# Patient Record
Sex: Female | Born: 1970 | Race: Black or African American | Hispanic: No | Smoking: Never smoker
Health system: Southern US, Community
[De-identification: ages and names within clinical notes are randomized; demographics above are authoritative.]

## PROBLEM LIST (undated history)

## (undated) DIAGNOSIS — E860 Dehydration: Secondary | ICD-10-CM

## (undated) DIAGNOSIS — K219 Gastro-esophageal reflux disease without esophagitis: Secondary | ICD-10-CM

## (undated) DIAGNOSIS — R55 Syncope and collapse: Secondary | ICD-10-CM

## (undated) DIAGNOSIS — L509 Urticaria, unspecified: Secondary | ICD-10-CM

## (undated) DIAGNOSIS — L309 Dermatitis, unspecified: Secondary | ICD-10-CM

## (undated) DIAGNOSIS — J45909 Unspecified asthma, uncomplicated: Secondary | ICD-10-CM

## (undated) DIAGNOSIS — E119 Type 2 diabetes mellitus without complications: Secondary | ICD-10-CM

## (undated) HISTORY — DX: Dermatitis, unspecified: L30.9

## (undated) HISTORY — PX: NO PAST SURGERIES: SHX2092

## (undated) HISTORY — DX: Urticaria, unspecified: L50.9

---

## 1998-06-04 ENCOUNTER — Other Ambulatory Visit: Admission: RE | Admit: 1998-06-04 | Discharge: 1998-06-04 | Payer: Self-pay | Admitting: Obstetrics and Gynecology

## 1998-08-13 ENCOUNTER — Other Ambulatory Visit: Admission: RE | Admit: 1998-08-13 | Discharge: 1998-08-13 | Payer: Self-pay | Admitting: Obstetrics and Gynecology

## 1998-11-05 ENCOUNTER — Other Ambulatory Visit: Admission: RE | Admit: 1998-11-05 | Discharge: 1998-11-05 | Payer: Self-pay | Admitting: Obstetrics and Gynecology

## 1999-01-28 ENCOUNTER — Other Ambulatory Visit: Admission: RE | Admit: 1999-01-28 | Discharge: 1999-01-28 | Payer: Self-pay | Admitting: Obstetrics and Gynecology

## 1999-02-15 ENCOUNTER — Emergency Department (HOSPITAL_COMMUNITY): Admission: EM | Admit: 1999-02-15 | Discharge: 1999-02-15 | Payer: Self-pay | Admitting: Emergency Medicine

## 1999-05-07 ENCOUNTER — Other Ambulatory Visit: Admission: RE | Admit: 1999-05-07 | Discharge: 1999-05-07 | Payer: Self-pay | Admitting: Obstetrics and Gynecology

## 1999-09-03 ENCOUNTER — Other Ambulatory Visit: Admission: RE | Admit: 1999-09-03 | Discharge: 1999-09-03 | Payer: Self-pay | Admitting: Obstetrics and Gynecology

## 2000-05-02 ENCOUNTER — Emergency Department (HOSPITAL_COMMUNITY): Admission: EM | Admit: 2000-05-02 | Discharge: 2000-05-02 | Payer: Self-pay

## 2000-06-09 ENCOUNTER — Other Ambulatory Visit: Admission: RE | Admit: 2000-06-09 | Discharge: 2000-06-09 | Payer: Self-pay | Admitting: Obstetrics and Gynecology

## 2001-08-10 ENCOUNTER — Other Ambulatory Visit: Admission: RE | Admit: 2001-08-10 | Discharge: 2001-08-10 | Payer: Self-pay | Admitting: Obstetrics and Gynecology

## 2002-01-07 ENCOUNTER — Emergency Department (HOSPITAL_COMMUNITY): Admission: EM | Admit: 2002-01-07 | Discharge: 2002-01-07 | Payer: Self-pay | Admitting: Emergency Medicine

## 2002-07-20 ENCOUNTER — Emergency Department (HOSPITAL_COMMUNITY): Admission: EM | Admit: 2002-07-20 | Discharge: 2002-07-20 | Payer: Self-pay

## 2002-07-20 ENCOUNTER — Encounter: Payer: Self-pay | Admitting: Emergency Medicine

## 2002-08-15 ENCOUNTER — Other Ambulatory Visit: Admission: RE | Admit: 2002-08-15 | Discharge: 2002-08-15 | Payer: Self-pay | Admitting: Obstetrics and Gynecology

## 2003-03-09 ENCOUNTER — Inpatient Hospital Stay (HOSPITAL_COMMUNITY): Admission: AD | Admit: 2003-03-09 | Discharge: 2003-03-09 | Payer: Self-pay | Admitting: Obstetrics and Gynecology

## 2003-05-13 ENCOUNTER — Encounter: Payer: Self-pay | Admitting: Obstetrics and Gynecology

## 2003-05-13 ENCOUNTER — Inpatient Hospital Stay (HOSPITAL_COMMUNITY): Admission: AD | Admit: 2003-05-13 | Discharge: 2003-05-13 | Payer: Self-pay | Admitting: Obstetrics and Gynecology

## 2003-08-22 ENCOUNTER — Inpatient Hospital Stay (HOSPITAL_COMMUNITY): Admission: AD | Admit: 2003-08-22 | Discharge: 2003-08-22 | Payer: Self-pay | Admitting: Obstetrics and Gynecology

## 2003-10-05 ENCOUNTER — Inpatient Hospital Stay (HOSPITAL_COMMUNITY): Admission: AD | Admit: 2003-10-05 | Discharge: 2003-10-05 | Payer: Self-pay | Admitting: Obstetrics and Gynecology

## 2003-10-15 ENCOUNTER — Inpatient Hospital Stay (HOSPITAL_COMMUNITY): Admission: AD | Admit: 2003-10-15 | Discharge: 2003-10-15 | Payer: Self-pay | Admitting: Obstetrics and Gynecology

## 2003-10-19 ENCOUNTER — Ambulatory Visit (HOSPITAL_COMMUNITY): Admission: RE | Admit: 2003-10-19 | Discharge: 2003-10-19 | Payer: Self-pay | Admitting: Obstetrics and Gynecology

## 2003-11-06 ENCOUNTER — Inpatient Hospital Stay (HOSPITAL_COMMUNITY): Admission: AD | Admit: 2003-11-06 | Discharge: 2003-11-06 | Payer: Self-pay | Admitting: Obstetrics and Gynecology

## 2003-11-15 ENCOUNTER — Inpatient Hospital Stay (HOSPITAL_COMMUNITY): Admission: AD | Admit: 2003-11-15 | Discharge: 2003-11-18 | Payer: Self-pay | Admitting: Obstetrics and Gynecology

## 2003-11-15 ENCOUNTER — Encounter (INDEPENDENT_AMBULATORY_CARE_PROVIDER_SITE_OTHER): Payer: Self-pay | Admitting: Specialist

## 2003-11-25 ENCOUNTER — Inpatient Hospital Stay (HOSPITAL_COMMUNITY): Admission: AD | Admit: 2003-11-25 | Discharge: 2003-11-25 | Payer: Self-pay | Admitting: Obstetrics and Gynecology

## 2004-03-04 ENCOUNTER — Other Ambulatory Visit: Admission: RE | Admit: 2004-03-04 | Discharge: 2004-03-04 | Payer: Self-pay | Admitting: Obstetrics and Gynecology

## 2004-11-04 ENCOUNTER — Emergency Department (HOSPITAL_COMMUNITY): Admission: EM | Admit: 2004-11-04 | Discharge: 2004-11-04 | Payer: Self-pay | Admitting: Emergency Medicine

## 2005-03-06 ENCOUNTER — Encounter: Admission: RE | Admit: 2005-03-06 | Discharge: 2005-03-06 | Payer: Self-pay | Admitting: Family Medicine

## 2008-03-21 ENCOUNTER — Emergency Department (HOSPITAL_BASED_OUTPATIENT_CLINIC_OR_DEPARTMENT_OTHER): Admission: EM | Admit: 2008-03-21 | Discharge: 2008-03-21 | Payer: Self-pay | Admitting: Emergency Medicine

## 2010-12-20 NOTE — H&P (Signed)
NAMEJAMMY, STLOUIS                           ACCOUNT NO.:  [id number]   MEDICAL RECORD NO.:  [id number]                   PATIENT TYPE:  INP   LOCATION:  9166                                 FACILITY:  [facility]   PHYSICIAN:  Rica Koyanagi, C.N.M.         DATE OF BIRTH:  12/05/1970   DATE OF ADMISSION:  11/15/2003  DATE OF DISCHARGE:                                HISTORY & PHYSICAL   Ms. Dwyer is a 40 year old, gravida 2, para 1-0-0-1, at 40-6/7 weeks with an  EDD of November 09, 2003, who presents from the office of [facility] following NST  with variable decelerations. Since admission to [facility] the fetal heart rate has  been in the 140s with positive variability. A spontaneous CST was obtained  which is positive. She is contracting every three to four minutes with  repetitive variables late in their onset following each contraction down to  the 120s for 40 to 60 seconds and recovering to the baseline initially on  evaluation. One variable at the beginning of the strip was down to the 100s  times one minute following a contraction. At the present time the fetal  heart rate is in the 140's with positive variability and accelerations.  Fetal heart rate has improved with only occasional mild variable  decelerations since starting oxygen 10 liters  face mask and IV fluid bolus.  The patient's contractions also began spacing out since time and are now  every five to six minutes. The patient reports positive fetal movement. No  bleeding. No range of motion. She does complain of a migraine headache times  ten days.  PIH labs were done and evaluated on November 08, 2003. They were  negative with 24-hour urine protein at 50 mg. The patient is to be admitted  for induction of labor secondary to nonreassuring fetal heart. The patient's  pregnancy was followed by MD service at [facility] and is remarkable for: (1)  first trimester bleeding; (2) RH negative; (3) history of abnormal Pap smear  and cryo in the year  2000; (4) history of migraines; (5) group B strep  positive; (6) obesity. This patient was evaluated at the office [facility] on  April 03, 2003, at approximately eight weeks gestation. EDD determined by  first trimester ultrasound and confirmed with follow-up. The patient's  prenatal course has been complicated with her migraine headaches for which  she has taken Tylenol #3. Otherwise, she has been size greater than dates  secondary to obesity. Ultrasound evaluation at 29 weeks found estimated  fetal weight at 50th to 75th percentile and at 38 weeks ultrasound showed  estimated fetal weight at the 25th to 50th percentile with normal fluid. The  patient's blood pressure was up somewhat on November 06, 2003, at 138/90. At  that time she was evaluated with [facility] labs which were all negative and a 24-  hour urine which was negative. Her blood pressure has come down  to the 120s  to 130s over 80s since that time.   OBSTETRIC HISTORY:  In 1994, the patient had a normal spontaneous vaginal  delivery with a birth of a 7 pound 3 ounce female infant named Aja delivered  by Dr. Beckey Downing with no complications and the present pregnancy.   PRENATAL LABORATORY WORK:  The patient's blood type is A negative. Pap smear  is within normal limits. GC and chlamydia negative. At 36 weeks culture of  the vaginal tract is positive for group B strep. The patient had a normal  one-hour Glucola at 28 weeks and did receive RhoGAM at that time.   PAST MEDICAL HISTORY:  The patient has a history of abnormal Pap and cryo  surgery. Pap smear have been within normal limits since that time. That was  the year 2000. The patient has a history of migraine headaches and a history  of UTIs. She had her wisdom teeth removed in the year 2000.   FAMILY HISTORY:  The patient's father and paternal grandfather with history  of MI; patient's father with history of heart disease; paternal grandmother  and mother with history of hypertension;  patient's mother with aneurysm;  patient's uncle has a history of blood disorders, she is unaware of the  type. Mother with a history of diabetes and stroke. Maternal grandfather  with Alzheimer disease.   GENETIC HISTORY:  The patient's first cousin born with one half of an arm.  The patient's brother and first cousin with history of scoliosis and  patient's nephew with sickle cell trait.   SOCIAL HISTORY:  Ms. Zellner is a 40 year old African American married female.  Her husband, Arrionna Serena, is involved and supportive. They do not  subscribe to a religious faith. The patient has no known drug allergies and  denies the use of illicit drugs. Denies the use of tobacco and admits to two  mixed drinks prior to positive UPT.   REVIEW OF SYSTEMS:  There are no signs or symptoms suggestive of focal or  systemic disease. The patient is typical one with intrauterine pregnancy, [redacted]  weeks gestation with a positive CST, admitted for induction of labor.   PHYSICAL EXAMINATION:  VITAL SIGNS: Stable and afebrile.  HEENT: Unremarkable.  HEART: Regular rate and rhythm.  LUNGS: Clear.  ABDOMEN: Gravid in its contour. The uterus fundus is noted to extend 43 cm  above the level of the pubic symphysis. Leopold's maneuvers finds the infant  to be in the longitudinal lie cephalic presentation and the estimated fetal  weight is 7 to 7.5 pounds. The baseline of the fetal heart rate monitor is  140s, average long-term variability is present. There are some accelerations  and some mild variable decelerations at the present time. The patient is  contracting every five to six minutes. Digital exam of the cervix in the  office of [facility] found it to be 1 cm dilated.  EXTREMITIES: No pathologic edema. DTRs are 1+ with no clonus.   ASSESSMENT:  Intrauterine pregnancy at 40-6/7 weeks; variable decelerations;  positive CST.   PLAN:  The patient is to be admitted per Dr. Dierdre Forth with orders as   written.                                               Rica Koyanagi, C.N.M.    SDM/MEDQ  D:  11/15/2003  T:  11/15/2003  Job:  [id number]

## 2012-10-20 ENCOUNTER — Other Ambulatory Visit: Payer: Self-pay

## 2012-10-20 DIAGNOSIS — Z1231 Encounter for screening mammogram for malignant neoplasm of breast: Secondary | ICD-10-CM

## 2012-10-26 ENCOUNTER — Ambulatory Visit
Admission: RE | Admit: 2012-10-26 | Discharge: 2012-10-26 | Disposition: A | Discharge status: Home / Self Care | Payer: Federal, State, Local not specified - PPO | Source: Ambulatory Visit

## 2012-10-26 DIAGNOSIS — Z1231 Encounter for screening mammogram for malignant neoplasm of breast: Secondary | ICD-10-CM

## 2012-10-29 ENCOUNTER — Other Ambulatory Visit: Payer: Self-pay | Admitting: Obstetrics and Gynecology

## 2012-11-01 ENCOUNTER — Emergency Department (HOSPITAL_BASED_OUTPATIENT_CLINIC_OR_DEPARTMENT_OTHER): Payer: Federal, State, Local not specified - PPO

## 2012-11-01 ENCOUNTER — Encounter (HOSPITAL_BASED_OUTPATIENT_CLINIC_OR_DEPARTMENT_OTHER): Payer: Self-pay | Admitting: *Deleted

## 2012-11-01 ENCOUNTER — Emergency Department (HOSPITAL_BASED_OUTPATIENT_CLINIC_OR_DEPARTMENT_OTHER)
Admission: EM | Admit: 2012-11-01 | Discharge: 2012-11-02 | Disposition: A | Discharge status: Home / Self Care | Payer: Federal, State, Local not specified - PPO | Attending: Emergency Medicine | Admitting: Emergency Medicine

## 2012-11-01 DIAGNOSIS — Z3202 Encounter for pregnancy test, result negative: Secondary | ICD-10-CM | POA: Insufficient documentation

## 2012-11-01 DIAGNOSIS — R112 Nausea with vomiting, unspecified: Secondary | ICD-10-CM | POA: Insufficient documentation

## 2012-11-01 DIAGNOSIS — R109 Unspecified abdominal pain: Secondary | ICD-10-CM | POA: Insufficient documentation

## 2012-11-01 DIAGNOSIS — R197 Diarrhea, unspecified: Secondary | ICD-10-CM | POA: Insufficient documentation

## 2012-11-01 DIAGNOSIS — K219 Gastro-esophageal reflux disease without esophagitis: Secondary | ICD-10-CM | POA: Insufficient documentation

## 2012-11-01 DIAGNOSIS — R55 Syncope and collapse: Secondary | ICD-10-CM

## 2012-11-01 DIAGNOSIS — Z79899 Other long term (current) drug therapy: Secondary | ICD-10-CM | POA: Insufficient documentation

## 2012-11-01 HISTORY — DX: Gastro-esophageal reflux disease without esophagitis: K21.9

## 2012-11-01 LAB — CBC WITH DIFFERENTIAL/PLATELET
Basophils Absolute: 0 10*3/uL (ref 0.0–0.1)
Basophils Relative: 0 % (ref 0–1)
Eosinophils Relative: 0 % (ref 0–5)
Lymphocytes Relative: 7 % — ABNORMAL LOW (ref 12–46)
MCHC: 34.7 g/dL (ref 30.0–36.0)
MCV: 85.8 fL (ref 78.0–100.0)
Neutro Abs: 10.2 10*3/uL — ABNORMAL HIGH (ref 1.7–7.7)
Platelets: 282 10*3/uL (ref 150–400)
RDW: 13.1 % (ref 11.5–15.5)
WBC: 11.7 10*3/uL — ABNORMAL HIGH (ref 4.0–10.5)

## 2012-11-01 LAB — PAP IG W/ RFLX HPV ASCU

## 2012-11-01 MED ORDER — SODIUM CHLORIDE 0.9 % IV BOLUS (SEPSIS)
1000.0000 mL | Freq: Once | INTRAVENOUS | Status: AC
  Administered 2012-11-01: 1000 mL via INTRAVENOUS

## 2012-11-01 MED ORDER — ONDANSETRON HCL 4 MG/2ML IJ SOLN
4.0000 mg | Freq: Once | INTRAMUSCULAR | Status: AC
  Administered 2012-11-01: 4 mg via INTRAVENOUS
  Filled 2012-11-01: qty 2

## 2012-11-01 MED ORDER — HYDROMORPHONE HCL PF 1 MG/ML IJ SOLN
1.0000 mg | Freq: Once | INTRAMUSCULAR | Status: AC
  Administered 2012-11-01: 1 mg via INTRAVENOUS
  Filled 2012-11-01: qty 1

## 2012-11-01 NOTE — ED Provider Notes (Signed)
History    This chart was scribed for No att. providers found by Marlyne Beards, ED Scribe. The patient was seen in room MH03/MH03. Patient's care was started at 11:21 PM.    CSN: [id number]  Arrival date & time 11/01/12  2221   None     Chief Complaint  Patient presents with  . Emesis    (Consider location/radiation/quality/duration/timing/severity/associated sxs/prior treatment) Patient is a 42 y.o. female presenting with vomiting. The history is provided by the patient and a relative. No language interpreter was used.  Emesis Associated symptoms: abdominal pain and diarrhea    Aimee Hall is a 42 y.o. female who presents to the Emergency Department complaining of moderate constant nausea onset 3AM this morning. Pt states that 3 am this morning she had associated vomiting (10 times total) and diarrhea. Pt was seen by her MD at [facility] (Dr. Arletha Grippe)  today and was given a shot of Phenergan and prescribed Zofran and took medication around 4 PM then again at 9PM. After three hours to ingesting medication pt states she was burning up with chills and felt like she was going to pass out. She got up from her bed to go to the bathroom and passed out. Shortly after she woke up, she had hit her head on a computer desk. Her daughter witnessed the incident. Pt is currently sore in her abdomen and feels nauseous in the ED. Pt denies HI, cough, SOB, chest pain, and any other associated symptoms. Pt denies tobacco or alcohol use. Pt's LMP was Thursday of last week which was normal. She currently is on Merena. Pt has been pregnant twice, has two children, and no abortions.  Past Medical History  Diagnosis Date  . GERD (gastroesophageal reflux disease)     History reviewed. No pertinent past surgical history.  No family history on file.  History  Substance Use Topics  . Smoking status: Never Smoker   . Smokeless tobacco: Not on file  . Alcohol Use: No    OB History   Grav Para Term  Preterm Abortions TAB SAB Ect Mult Living                  Review of Systems  Gastrointestinal: Positive for nausea, vomiting, abdominal pain and diarrhea.  Neurological: Positive for syncope.  All other systems reviewed and are negative.    Allergies  Iodinated diagnostic agents  Home Medications   Current Outpatient Rx  Name  Route  Sig  Dispense  Refill  . OMEPRAZOLE PO   Oral   Take by mouth.           BP 142/88  Pulse 107  Temp(Src) 99 F (37.2 C) (Oral)  Resp 20  Wt 320 lb (145.151 kg)  SpO2 94%  LMP 10/30/2012  Physical Exam  Nursing note and vitals reviewed. Constitutional: She is oriented to person, place, and time. She appears well-developed and well-nourished. No distress.  HENT:  Head: Normocephalic and atraumatic.  Right Ear: External ear normal.  Left Ear: External ear normal.  Mouth/Throat: Oropharynx is clear and moist and mucous membranes are normal.  Eyes: Conjunctivae and EOM are normal. Pupils are equal, round, and reactive to light.  Neck: Normal range of motion. Neck supple. No tracheal deviation present.  Cardiovascular: Normal rate, normal heart sounds and intact distal pulses.   No murmur heard. HR 88 currently   Pulmonary/Chest: Effort normal and breath sounds normal. No respiratory distress. She has no wheezes.  Abdominal: Soft.  Bowel sounds are normal. There is no tenderness. There is no guarding.  Mild diffuse abdominal tenderness.  Musculoskeletal: Normal range of motion. She exhibits no edema.  Lymphadenopathy:    She has no cervical adenopathy.  Neurological: She is alert and oriented to person, place, and time. She has normal strength and normal reflexes. No sensory deficit. She displays a negative Romberg sign. GCS eye subscore is 4. GCS verbal subscore is 5. GCS motor subscore is 6.  Skin: Skin is warm and dry. No rash noted.  Psychiatric: She has a normal mood and affect. Her behavior is normal.    ED Course  Procedures  (including critical care time) DIAGNOSTIC STUDIES: Oxygen Saturation is 94% on room air, adequate by my interpretation.    COORDINATION OF CARE: 11:30 PM Discussed ED treatment with pt and pt agrees.     Labs Reviewed  CBC WITH DIFFERENTIAL - Abnormal; Notable for the following:    WBC 11.7 (*)    Neutrophils Relative 87 (*)    Neutro Abs 10.2 (*)    Lymphocytes Relative 7 (*)    All other components within normal limits  COMPREHENSIVE METABOLIC PANEL - Abnormal; Notable for the following:    Sodium 134 (*)    Glucose, Bld 135 (*)    All other components within normal limits  LIPASE, BLOOD  URINALYSIS, ROUTINE W REFLEX MICROSCOPIC  PREGNANCY, URINE   No results found.   No diagnosis found.    Date: 11/02/2012  Rate: 97  Rhythm: normal sinus rhythm with pvcs  QRS Axis: normal  Intervals: normal  ST/T Wave abnormalities: normal  Conduction Disutrbances:none  Narrative Interpretation:   Old EKG Reviewed: changes noted   MDM  42 y.o. Female with 16 hours of nausea vomiting and diarrhea with syncopal episode at home tonight. Patient without vomiting here after iv zofran.  Patient had episode of hypotension after dilaudid but remained awake and arousable.  She received iv fluids with rebound of bp to normal.  She currently is awake and alert and feels improved.  She will be discharged to home with prescription for odt zofran to replace oral zofran prescribed today.     I personally performed the services described in this documentation, which was scribed in my presence. The recorded information has been reviewed and considered.    Hilario Quarry, MD 11/02/12 [phone]

## 2012-11-01 NOTE — ED Notes (Signed)
Pt reports unable to void at this time.

## 2012-11-01 NOTE — ED Notes (Signed)
Vomiting since 3am. Diarrhea. Was seen by her MD today and given Phenergan IM. She got a Rx filled for Zofran. States she passed out. Weak.

## 2012-11-02 LAB — URINALYSIS, ROUTINE W REFLEX MICROSCOPIC
Glucose, UA: NEGATIVE mg/dL
Leukocytes, UA: NEGATIVE
Specific Gravity, Urine: 1.009 (ref 1.005–1.030)
pH: 6 (ref 5.0–8.0)

## 2012-11-02 LAB — COMPREHENSIVE METABOLIC PANEL
ALT: 26 U/L (ref 0–35)
AST: 26 U/L (ref 0–37)
Albumin: 3.7 g/dL (ref 3.5–5.2)
CO2: 22 mEq/L (ref 19–32)
Calcium: 9 mg/dL (ref 8.4–10.5)
GFR calc non Af Amer: >90 mL/min (ref >90)
Sodium: 134 mEq/L — ABNORMAL LOW (ref 135–145)
Total Protein: 8.1 g/dL (ref 6.0–8.3)

## 2012-11-02 LAB — PREGNANCY, URINE: Preg Test, Ur: NEGATIVE

## 2012-11-02 LAB — URINE MICROSCOPIC-ADD ON

## 2012-11-02 MED ORDER — ONDANSETRON 8 MG PO TBDP: 8.0000 mg | ORAL_TABLET | Freq: Three times a day (TID) | ORAL | Status: DC | PRN

## 2012-11-02 NOTE — ED Notes (Signed)
Aimee Hall, EMT notified this RN of patient's abnormal  BP. This RN at bedside with patient. Patient placed on 2L nasal cannula. Pressure bag applied to NS bolus per MD verbal order. Pt A&O x 4, responds appropriately to verbal stimuli.

## 2012-11-02 NOTE — ED Notes (Signed)
Pt responds to verbal stimulation. Pt A&O x 4. Pt denies any pain or nausea at this time. States "warm feeling is gone". Family at bedside.

## 2012-11-02 NOTE — ED Notes (Signed)
RN notified at first low B/P after pain med given

## 2012-11-03 LAB — URINE CULTURE

## 2012-11-25 ENCOUNTER — Ambulatory Visit: Payer: Self-pay

## 2013-05-10 ENCOUNTER — Ambulatory Visit: Payer: Federal, State, Local not specified - PPO | Attending: Orthopedic Surgery | Admitting: Physical Therapy

## 2013-05-10 DIAGNOSIS — M25579 Pain in unspecified ankle and joints of unspecified foot: Secondary | ICD-10-CM | POA: Insufficient documentation

## 2013-05-10 DIAGNOSIS — M25676 Stiffness of unspecified foot, not elsewhere classified: Secondary | ICD-10-CM | POA: Insufficient documentation

## 2013-05-10 DIAGNOSIS — M25673 Stiffness of unspecified ankle, not elsewhere classified: Secondary | ICD-10-CM | POA: Insufficient documentation

## 2013-05-10 DIAGNOSIS — IMO0001 Reserved for inherently not codable concepts without codable children: Secondary | ICD-10-CM | POA: Insufficient documentation

## 2013-05-10 DIAGNOSIS — R609 Edema, unspecified: Secondary | ICD-10-CM | POA: Insufficient documentation

## 2013-05-10 DIAGNOSIS — M6281 Muscle weakness (generalized): Secondary | ICD-10-CM | POA: Insufficient documentation

## 2013-06-03 ENCOUNTER — Ambulatory Visit: Payer: Federal, State, Local not specified - PPO | Admitting: Rehabilitation

## 2013-09-16 ENCOUNTER — Ambulatory Visit: Payer: Federal, State, Local not specified - PPO | Attending: Orthopedic Surgery | Admitting: Physical Therapy

## 2013-09-16 DIAGNOSIS — R269 Unspecified abnormalities of gait and mobility: Secondary | ICD-10-CM | POA: Diagnosis not present

## 2013-09-16 DIAGNOSIS — M6281 Muscle weakness (generalized): Secondary | ICD-10-CM | POA: Insufficient documentation

## 2013-09-16 DIAGNOSIS — M25673 Stiffness of unspecified ankle, not elsewhere classified: Secondary | ICD-10-CM | POA: Diagnosis not present

## 2013-09-16 DIAGNOSIS — M25676 Stiffness of unspecified foot, not elsewhere classified: Secondary | ICD-10-CM | POA: Insufficient documentation

## 2013-09-16 DIAGNOSIS — R609 Edema, unspecified: Secondary | ICD-10-CM | POA: Diagnosis not present

## 2013-09-16 DIAGNOSIS — IMO0001 Reserved for inherently not codable concepts without codable children: Secondary | ICD-10-CM | POA: Insufficient documentation

## 2013-09-16 DIAGNOSIS — M25579 Pain in unspecified ankle and joints of unspecified foot: Secondary | ICD-10-CM | POA: Insufficient documentation

## 2013-09-19 ENCOUNTER — Ambulatory Visit: Payer: Federal, State, Local not specified - PPO | Admitting: Rehabilitation

## 2013-09-19 DIAGNOSIS — IMO0001 Reserved for inherently not codable concepts without codable children: Secondary | ICD-10-CM | POA: Diagnosis not present

## 2013-09-23 ENCOUNTER — Ambulatory Visit: Payer: Federal, State, Local not specified - PPO | Admitting: Physical Therapy

## 2013-09-23 DIAGNOSIS — IMO0001 Reserved for inherently not codable concepts without codable children: Secondary | ICD-10-CM | POA: Diagnosis not present

## 2013-09-30 ENCOUNTER — Ambulatory Visit: Payer: Federal, State, Local not specified - PPO | Admitting: Rehabilitation

## 2013-10-05 ENCOUNTER — Other Ambulatory Visit: Payer: Self-pay

## 2013-10-05 DIAGNOSIS — Z1231 Encounter for screening mammogram for malignant neoplasm of breast: Secondary | ICD-10-CM

## 2013-10-07 ENCOUNTER — Ambulatory Visit: Payer: Federal, State, Local not specified - PPO | Attending: Orthopedic Surgery | Admitting: Physical Therapy

## 2013-10-07 DIAGNOSIS — IMO0001 Reserved for inherently not codable concepts without codable children: Secondary | ICD-10-CM | POA: Insufficient documentation

## 2013-10-07 DIAGNOSIS — M6281 Muscle weakness (generalized): Secondary | ICD-10-CM | POA: Insufficient documentation

## 2013-10-07 DIAGNOSIS — M25673 Stiffness of unspecified ankle, not elsewhere classified: Secondary | ICD-10-CM | POA: Insufficient documentation

## 2013-10-07 DIAGNOSIS — R269 Unspecified abnormalities of gait and mobility: Secondary | ICD-10-CM | POA: Insufficient documentation

## 2013-10-07 DIAGNOSIS — M25579 Pain in unspecified ankle and joints of unspecified foot: Secondary | ICD-10-CM | POA: Insufficient documentation

## 2013-10-07 DIAGNOSIS — M25676 Stiffness of unspecified foot, not elsewhere classified: Secondary | ICD-10-CM | POA: Insufficient documentation

## 2013-10-07 DIAGNOSIS — R609 Edema, unspecified: Secondary | ICD-10-CM | POA: Insufficient documentation

## 2013-10-14 ENCOUNTER — Ambulatory Visit: Payer: Federal, State, Local not specified - PPO | Admitting: Rehabilitation

## 2013-10-21 ENCOUNTER — Ambulatory Visit: Payer: Federal, State, Local not specified - PPO | Admitting: Physical Therapy

## 2013-10-27 ENCOUNTER — Ambulatory Visit
Admission: RE | Admit: 2013-10-27 | Discharge: 2013-10-27 | Disposition: A | Discharge status: Home / Self Care | Payer: Federal, State, Local not specified - PPO | Source: Ambulatory Visit

## 2013-10-27 DIAGNOSIS — Z1231 Encounter for screening mammogram for malignant neoplasm of breast: Secondary | ICD-10-CM

## 2013-10-28 ENCOUNTER — Ambulatory Visit: Payer: Federal, State, Local not specified - PPO | Admitting: Rehabilitation

## 2013-10-29 ENCOUNTER — Emergency Department (HOSPITAL_BASED_OUTPATIENT_CLINIC_OR_DEPARTMENT_OTHER): Payer: Federal, State, Local not specified - PPO

## 2013-10-29 ENCOUNTER — Encounter (HOSPITAL_BASED_OUTPATIENT_CLINIC_OR_DEPARTMENT_OTHER): Payer: Self-pay | Admitting: Emergency Medicine

## 2013-10-29 ENCOUNTER — Emergency Department (HOSPITAL_BASED_OUTPATIENT_CLINIC_OR_DEPARTMENT_OTHER)
Admission: EM | Admit: 2013-10-29 | Discharge: 2013-10-29 | Disposition: A | Discharge status: Home / Self Care | Payer: Federal, State, Local not specified - PPO | Attending: Emergency Medicine | Admitting: Emergency Medicine

## 2013-10-29 DIAGNOSIS — J45909 Unspecified asthma, uncomplicated: Secondary | ICD-10-CM | POA: Insufficient documentation

## 2013-10-29 DIAGNOSIS — K219 Gastro-esophageal reflux disease without esophagitis: Secondary | ICD-10-CM | POA: Insufficient documentation

## 2013-10-29 DIAGNOSIS — J029 Acute pharyngitis, unspecified: Secondary | ICD-10-CM | POA: Insufficient documentation

## 2013-10-29 DIAGNOSIS — R079 Chest pain, unspecified: Secondary | ICD-10-CM | POA: Insufficient documentation

## 2013-10-29 DIAGNOSIS — Z79899 Other long term (current) drug therapy: Secondary | ICD-10-CM | POA: Insufficient documentation

## 2013-10-29 DIAGNOSIS — R0982 Postnasal drip: Secondary | ICD-10-CM | POA: Insufficient documentation

## 2013-10-29 HISTORY — DX: Unspecified asthma, uncomplicated: J45.909

## 2013-10-29 LAB — CBC WITH DIFFERENTIAL/PLATELET
BASOS PCT: 0 % (ref 0–1)
Basophils Absolute: 0 10*3/uL (ref 0.0–0.1)
EOS ABS: 0.2 10*3/uL (ref 0.0–0.7)
Eosinophils Relative: 2 % (ref 0–5)
HCT: 37.8 % (ref 36.0–46.0)
HEMOGLOBIN: 12.9 g/dL (ref 12.0–15.0)
Lymphocytes Relative: 19 % (ref 12–46)
Lymphs Abs: 2.2 10*3/uL (ref 0.7–4.0)
MCH: 30.1 pg (ref 26.0–34.0)
MCHC: 34.1 g/dL (ref 30.0–36.0)
MCV: 88.3 fL (ref 78.0–100.0)
MONOS PCT: 6 % (ref 3–12)
Monocytes Absolute: 0.7 10*3/uL (ref 0.1–1.0)
NEUTROS PCT: 73 % (ref 43–77)
Neutro Abs: 8.1 10*3/uL — ABNORMAL HIGH (ref 1.7–7.7)
PLATELETS: 294 10*3/uL (ref 150–400)
RBC: 4.28 MIL/uL (ref 3.87–5.11)
RDW: 12.8 % (ref 11.5–15.5)
WBC: 11.2 10*3/uL — ABNORMAL HIGH (ref 4.0–10.5)

## 2013-10-29 LAB — BASIC METABOLIC PANEL
BUN: 12 mg/dL (ref 6–23)
CO2: 26 mEq/L (ref 19–32)
Calcium: 9.3 mg/dL (ref 8.4–10.5)
Chloride: 101 mEq/L (ref 96–112)
Creatinine, Ser: 0.8 mg/dL (ref 0.50–1.10)
GFR, EST NON AFRICAN AMERICAN: 90 mL/min — AB (ref >90)
Glucose, Bld: 138 mg/dL — ABNORMAL HIGH (ref 70–99)
POTASSIUM: 4.3 meq/L (ref 3.7–5.3)
Sodium: 138 mEq/L (ref 137–147)

## 2013-10-29 MED ORDER — IOHEXOL 300 MG/ML  SOLN
80.0000 mL | Freq: Once | INTRAMUSCULAR | Status: AC | PRN
  Administered 2013-10-29: 80 mL via INTRAVENOUS

## 2013-10-29 MED ORDER — IBUPROFEN 400 MG PO TABS: 400.0000 mg | ORAL_TABLET | Freq: Four times a day (QID) | ORAL | Status: DC | PRN

## 2013-10-29 MED ORDER — FEXOFENADINE HCL 60 MG PO TABS: 60.0000 mg | ORAL_TABLET | Freq: Two times a day (BID) | ORAL | Status: DC

## 2013-10-29 MED ORDER — DEXAMETHASONE SODIUM PHOSPHATE 10 MG/ML IJ SOLN
10.0000 mg | Freq: Once | INTRAMUSCULAR | Status: AC
  Administered 2013-10-29: 10 mg via INTRAVENOUS
  Filled 2013-10-29: qty 1

## 2013-10-29 MED ORDER — SODIUM CHLORIDE 0.9 % IV BOLUS (SEPSIS)
1000.0000 mL | Freq: Once | INTRAVENOUS | Status: AC
  Administered 2013-10-29: 1000 mL via INTRAVENOUS

## 2013-10-29 MED ORDER — KETOROLAC TROMETHAMINE 30 MG/ML IJ SOLN
30.0000 mg | Freq: Once | INTRAMUSCULAR | Status: AC
  Administered 2013-10-29: 30 mg via INTRAVENOUS
  Filled 2013-10-29: qty 1

## 2013-10-29 NOTE — ED Provider Notes (Signed)
CSN: [id number]     Arrival date & time 10/29/13  0226 History   First MD Initiated Contact with Patient 10/29/13 0255     Chief Complaint  Patient presents with  . Asthma     (Consider location/radiation/quality/duration/timing/severity/associated sxs/prior Treatment) HPI Comments: 43 y/o F with hx of asthma, GERD comes in with cc of sore throat, dib. Pt reports having sore throat since Monday. Was seen by PCP, strep negative, discharged. Overtime, her sore throat has worsened, and she has dysphagia, and odynophagia, hoarseness in voice and also trouble breathing. Pt also is having some chest discomfort, cough. Chest pain associated with her cough. No fevers, chills, nausea, emesis.  Patient is a 43 y.o. female presenting with asthma. The history is provided by the patient.  Asthma Associated symptoms include chest pain. Pertinent negatives include no abdominal pain, no headaches and no shortness of breath.    Past Medical History  Diagnosis Date  . GERD (gastroesophageal reflux disease)   . Asthma    History reviewed. No pertinent past surgical history. No family history on file. History  Substance Use Topics  . Smoking status: Never Smoker   . Smokeless tobacco: Not on file  . Alcohol Use: No   OB History   Grav Para Term Preterm Abortions TAB SAB Ect Mult Living                 Review of Systems  Constitutional: Positive for activity change. Negative for fever.  HENT: Positive for postnasal drip, sore throat, trouble swallowing and voice change. Negative for drooling.   Respiratory: Positive for cough. Negative for shortness of breath.   Cardiovascular: Positive for chest pain.  Gastrointestinal: Negative for nausea, vomiting and abdominal pain.  Genitourinary: Negative for dysuria.  Musculoskeletal: Negative for neck pain.  Neurological: Negative for headaches.  All other systems reviewed and are negative.      Allergies  Dilaudid; Iodinated diagnostic agents;  and Shellfish allergy  Home Medications   Current Outpatient Rx  Name  Route  Sig  Dispense  Refill  . bisacodyl (DULCOLAX) 10 MG suppository   Rectal   Place 10 mg rectally as needed for moderate constipation.         . traMADol (ULTRAM) 50 MG tablet   Oral   Take 50 mg by mouth every 6 (six) hours as needed.         Marland Kitchen. OMEPRAZOLE PO   Oral   Take by mouth.          BP 123/91  Pulse 90  Temp(Src) 98.1 F (36.7 C)  SpO2 99% Physical Exam  Nursing note and vitals reviewed. Constitutional: She is oriented to person, place, and time. She appears well-developed and well-nourished.  HENT:  Head: Normocephalic and atraumatic.  Mouth/Throat: Oropharynx is clear and moist. No oropharyngeal exudate.  Eyes: EOM are normal. Pupils are equal, round, and reactive to light.  Neck: Neck supple.  Cardiovascular: Normal rate, regular rhythm and normal heart sounds.   No murmur heard. Pulmonary/Chest: Effort normal. No respiratory distress.  Abdominal: Soft. She exhibits no distension. There is no tenderness. There is no rebound and no guarding.  Lymphadenopathy:    She has no cervical adenopathy.  Neurological: She is alert and oriented to person, place, and time.  Skin: Skin is warm and dry.    ED Course  Procedures (including critical care time) Labs Review Labs Reviewed  CBC WITH DIFFERENTIAL  BASIC METABOLIC PANEL   Imaging Review Mm Screening  Breast Tomo Bilateral  10/27/2013   CLINICAL DATA:  Screening.  EXAM: DIGITAL SCREENING BILATERAL MAMMOGRAM WITH 3D TOMO WITH CAD  COMPARISON:  Previous exam(s).  ACR Breast Density Category b: There are scattered areas of fibroglandular density.  FINDINGS: There are no findings suspicious for malignancy. Images were processed with CAD.  IMPRESSION: No mammographic evidence of malignancy. A result letter of this screening mammogram will be mailed directly to the patient.  RECOMMENDATION: Screening mammogram in one year.  (Code:SM-B-01Y)  BI-RADS CATEGORY  1: Negative.   Electronically Signed   By: Elberta Fortis M.D.   On: 10/27/2013 11:44     EKG Interpretation None      MDM   Final diagnoses:  None    Pt comes in with cc of sore throat. Has some red flags on hx for possible obstructive etiology. Basic labs ordered, and will need a CT soft tissue neck to ensure there is no deep infection of the throat.  Derwood Kaplan, MD 10/29/13 0345

## 2013-10-29 NOTE — Discharge Instructions (Signed)
See your primary care doctor for further evaluation. With a normal Ct of you neck, essentially all emergent, severe causes for sore throat, have been ruled out. Your symptoms are also possibly from sleep apnea. See your doctor, as he or she might evaluate you for any non emergent cause for your symptoms.  Allergies Allergies may happen from anything your body is sensitive to. This may be food, medicines, pollens, chemicals, and nearly anything around you in everyday life that produces allergens. An allergen is anything that causes an allergy producing substance. Heredity is often a factor in causing these problems. This means you may have some of the same allergies as your parents. Food allergies happen in all age groups. Food allergies are some of the most severe and life threatening. Some common food allergies are cow's milk, seafood, eggs, nuts, wheat, and soybeans. SYMPTOMS   Swelling around the mouth.  An itchy red rash or hives.  Vomiting or diarrhea.  Difficulty breathing. SEVERE ALLERGIC REACTIONS ARE LIFE-THREATENING. This reaction is called anaphylaxis. It can cause the mouth and throat to swell and cause difficulty with breathing and swallowing. In severe reactions only a trace amount of food (for example, peanut oil in a salad) may cause death within seconds. Seasonal allergies occur in all age groups. These are seasonal because they usually occur during the same season every year. They may be a reaction to molds, grass pollens, or tree pollens. Other causes of problems are house dust mite allergens, pet dander, and mold spores. The symptoms often consist of nasal congestion, a runny itchy nose associated with sneezing, and tearing itchy eyes. There is often an associated itching of the mouth and ears. The problems happen when you come in contact with pollens and other allergens. Allergens are the particles in the air that the body reacts to with an allergic reaction. This causes you  to release allergic antibodies. Through a chain of events, these eventually cause you to release histamine into the blood stream. Although it is meant to be protective to the body, it is this release that causes your discomfort. This is why you were given anti-histamines to feel better. If you are unable to pinpoint the offending allergen, it may be determined by skin or blood testing. Allergies cannot be cured but can be controlled with medicine. Hay fever is a collection of all or some of the seasonal allergy problems. It may often be treated with simple over-the-counter medicine such as diphenhydramine. Take medicine as directed. Do not drink alcohol or drive while taking this medicine. Check with your caregiver or package insert for child dosages. If these medicines are not effective, there are many new medicines your caregiver can prescribe. Stronger medicine such as nasal spray, eye drops, and corticosteroids may be used if the first things you try do not work well. Other treatments such as immunotherapy or desensitizing injections can be used if all else fails. Follow up with your caregiver if problems continue. These seasonal allergies are usually not life threatening. They are generally more of a nuisance that can often be handled using medicine. HOME CARE INSTRUCTIONS   If unsure what causes a reaction, keep a diary of foods eaten and symptoms that follow. Avoid foods that cause reactions.  If hives or rash are present:  Take medicine as directed.  You may use an over-the-counter antihistamine (diphenhydramine) for hives and itching as needed.  Apply cold compresses (cloths) to the skin or take baths in cool water. Avoid hot baths  or showers. Heat will make a rash and itching worse.  If you are severely allergic:  Following a treatment for a severe reaction, hospitalization is often required for closer follow-up.  Wear a medic-alert bracelet or necklace stating the allergy.  You and  your family must learn how to give adrenaline or use an anaphylaxis kit.  If you have had a severe reaction, always carry your anaphylaxis kit or EpiPen with you. Use this medicine as directed by your caregiver if a severe reaction is occurring. Failure to do so could have a fatal outcome. SEEK MEDICAL CARE IF:  You suspect a food allergy. Symptoms generally happen within 30 minutes of eating a food.  Your symptoms have not gone away within 2 days or are getting worse.  You develop new symptoms.  You want to retest yourself or your child with a food or drink you think causes an allergic reaction. Never do this if an anaphylactic reaction to that food or drink has happened before. Only do this under the care of a caregiver. SEEK IMMEDIATE MEDICAL CARE IF:   You have difficulty breathing, are wheezing, or have a tight feeling in your chest or throat.  You have a swollen mouth, or you have hives, swelling, or itching all over your body.  You have had a severe reaction that has responded to your anaphylaxis kit or an EpiPen. These reactions may return when the medicine has worn off. These reactions should be considered life threatening. MAKE SURE YOU:   Understand these instructions.  Will watch your condition.  Will get help right away if you are not doing well or get worse. Document Released: 10/14/2002 Document Revised: 11/15/2012 Document Reviewed: 03/20/2008 [facility] Patient Information 2014 [place].

## 2013-10-29 NOTE — ED Notes (Signed)
Asthmatic  Chest pain  sorethroat

## 2015-08-18 ENCOUNTER — Emergency Department (HOSPITAL_BASED_OUTPATIENT_CLINIC_OR_DEPARTMENT_OTHER): Payer: Federal, State, Local not specified - PPO

## 2015-08-18 ENCOUNTER — Encounter (HOSPITAL_BASED_OUTPATIENT_CLINIC_OR_DEPARTMENT_OTHER): Payer: Self-pay | Admitting: Emergency Medicine

## 2015-08-18 ENCOUNTER — Emergency Department (HOSPITAL_BASED_OUTPATIENT_CLINIC_OR_DEPARTMENT_OTHER)
Admission: EM | Admit: 2015-08-18 | Discharge: 2015-08-18 | Disposition: A | Discharge status: Home / Self Care | Payer: Federal, State, Local not specified - PPO | Attending: Emergency Medicine | Admitting: Emergency Medicine

## 2015-08-18 DIAGNOSIS — J45909 Unspecified asthma, uncomplicated: Secondary | ICD-10-CM | POA: Diagnosis not present

## 2015-08-18 DIAGNOSIS — R079 Chest pain, unspecified: Secondary | ICD-10-CM | POA: Insufficient documentation

## 2015-08-18 DIAGNOSIS — Z79899 Other long term (current) drug therapy: Secondary | ICD-10-CM | POA: Insufficient documentation

## 2015-08-18 DIAGNOSIS — R1010 Upper abdominal pain, unspecified: Secondary | ICD-10-CM | POA: Insufficient documentation

## 2015-08-18 DIAGNOSIS — M545 Low back pain: Secondary | ICD-10-CM | POA: Diagnosis not present

## 2015-08-18 DIAGNOSIS — K219 Gastro-esophageal reflux disease without esophagitis: Secondary | ICD-10-CM | POA: Insufficient documentation

## 2015-08-18 LAB — HEPATIC FUNCTION PANEL
ALBUMIN: 4 g/dL (ref 3.5–5.0)
ALT: 24 U/L (ref 14–54)
AST: 19 U/L (ref 15–41)
Alkaline Phosphatase: 60 U/L (ref 38–126)
BILIRUBIN TOTAL: 0.3 mg/dL (ref 0.3–1.2)
Bilirubin, Direct: <0.1 mg/dL — ABNORMAL LOW (ref 0.1–0.5)
TOTAL PROTEIN: 7.9 g/dL (ref 6.5–8.1)

## 2015-08-18 LAB — BASIC METABOLIC PANEL
Anion gap: 8 (ref 5–15)
BUN: 18 mg/dL (ref 6–20)
CHLORIDE: 105 mmol/L (ref 101–111)
CO2: 23 mmol/L (ref 22–32)
CREATININE: 0.85 mg/dL (ref 0.44–1.00)
Calcium: 9.3 mg/dL (ref 8.9–10.3)
GFR calc non Af Amer: >60 mL/min (ref >60)
Glucose, Bld: 105 mg/dL — ABNORMAL HIGH (ref 65–99)
POTASSIUM: 4.1 mmol/L (ref 3.5–5.1)
SODIUM: 136 mmol/L (ref 135–145)

## 2015-08-18 LAB — CBC
HCT: 38.9 % (ref 36.0–46.0)
Hemoglobin: 13 g/dL (ref 12.0–15.0)
MCH: 29 pg (ref 26.0–34.0)
MCHC: 33.4 g/dL (ref 30.0–36.0)
MCV: 86.6 fL (ref 78.0–100.0)
PLATELETS: 305 10*3/uL (ref 150–400)
RBC: 4.49 MIL/uL (ref 3.87–5.11)
RDW: 12.9 % (ref 11.5–15.5)
WBC: 10.4 10*3/uL (ref 4.0–10.5)

## 2015-08-18 LAB — LIPASE, BLOOD: LIPASE: 40 U/L (ref 11–51)

## 2015-08-18 LAB — TROPONIN I

## 2015-08-18 NOTE — ED Provider Notes (Signed)
CSN: [id number]     Arrival date & time 08/18/15  1939 History   First MD Initiated Contact with Patient 08/18/15 2050     Chief Complaint  Patient presents with  . Chest Pain     (Consider location/radiation/quality/duration/timing/severity/associated sxs/prior Treatment) HPI Comments: Patient presents with three-day history of intermittent achiness in her left chest which radiates to her left middle back. Pain has become more frequent and constant, and patient reports that she has had it constantly since yesterday. She has not had pain like this in the past. It is not associated with shortness of breath, diaphoresis, exertion. It does not change with foods. Patient denies heavy NSAID use or alcohol use. No lower abdominal pain. No fevers, vomiting, diarrhea or urinary symptoms. Patient has had some nausea at times. She does have a history of reflux, and is on medication for this but states it feels different. No new medications except recent addition of a statin. She denies body aches elsewhere. Nothing seems to make the pain better or worse. No cardiac history. No history of elevated blood pressure, diabetes, smoking, strong family history of CAD. Patient denies risk factors for pulmonary embolism including: unilateral leg swelling, history of DVT/PE/other blood clots, use of exogenous hormones, recent immobilizations, recent surgery, recent travel (>4hr segment), malignancy, hemoptysis.     Patient is a 45 y.o. female presenting with chest pain. The history is provided by the patient.  Chest Pain Associated symptoms: back pain   Associated symptoms: no abdominal pain, no cough, no diaphoresis, no fever, no nausea, no palpitations, no shortness of breath and not vomiting     Past Medical History  Diagnosis Date  . GERD (gastroesophageal reflux disease)   . Asthma    History reviewed. No pertinent past surgical history. History reviewed. No pertinent family history. Social History    Substance Use Topics  . Smoking status: Never Smoker   . Smokeless tobacco: None  . Alcohol Use: No   OB History    No data available     Review of Systems  Constitutional: Negative for fever and diaphoresis.  Eyes: Negative for redness.  Respiratory: Negative for cough and shortness of breath.   Cardiovascular: Positive for chest pain. Negative for palpitations and leg swelling.  Gastrointestinal: Negative for nausea, vomiting and abdominal pain.  Genitourinary: Negative for dysuria.  Musculoskeletal: Positive for back pain. Negative for neck pain.  Skin: Negative for rash.  Neurological: Negative for syncope and light-headedness.  Psychiatric/Behavioral: The patient is not nervous/anxious.       Allergies  Dilaudid; Iodinated diagnostic agents; and Shellfish allergy  Home Medications   Prior to Admission medications   Medication Sig Start Date End Date Taking? Authorizing Provider  bisacodyl (DULCOLAX) 10 MG suppository Place 10 mg rectally as needed for moderate constipation.    Historical Provider, MD  fexofenadine (ALLEGRA) 60 MG tablet Take 1 tablet (60 mg total) by mouth 2 (two) times daily. 10/29/13   Derwood Kaplan, MD  ibuprofen (ADVIL,MOTRIN) 400 MG tablet Take 1 tablet (400 mg total) by mouth every 6 (six) hours as needed. 10/29/13   Derwood Kaplan, MD  OMEPRAZOLE PO Take by mouth.    Historical Provider, MD  traMADol (ULTRAM) 50 MG tablet Take 50 mg by mouth every 6 (six) hours as needed.    Historical Provider, MD   BP 116/67 mmHg  Pulse 67  Temp(Src) 98.2 F (36.8 C) (Oral)  Resp 14  Ht 5\' 10"  (1.778 m)  Wt 145.151 kg  BMI 45.92 kg/m2  SpO2 100%   Physical Exam  Constitutional: She appears well-developed and well-nourished.  HENT:  Head: Normocephalic and atraumatic.  Mouth/Throat: Mucous membranes are normal. Mucous membranes are not dry.  Eyes: Conjunctivae are normal.  Neck: Trachea normal and normal range of motion. Neck supple. Normal carotid  pulses and no JVD present. No muscular tenderness present. Carotid bruit is not present. No tracheal deviation present.  Cardiovascular: Normal rate, regular rhythm, S1 normal, S2 normal, normal heart sounds and intact distal pulses.  Exam reveals no decreased pulses.   No murmur heard. Pulmonary/Chest: Effort normal. No respiratory distress. She has no wheezes. She exhibits no tenderness (No anterior chest wall tenderness.).  Abdominal: Soft. Normal aorta and bowel sounds are normal. There is no tenderness. There is no rebound and no guarding.  Musculoskeletal: Normal range of motion.       Cervical back: She exhibits normal range of motion, no tenderness and no bony tenderness.       Thoracic back: She exhibits tenderness. She exhibits normal range of motion and no bony tenderness.       Lumbar back: She exhibits normal range of motion, no tenderness and no bony tenderness.       Back:  Neurological: She is alert.  Skin: Skin is warm and dry. She is not diaphoretic. No cyanosis. No pallor.  Psychiatric: She has a normal mood and affect.  Nursing note and vitals reviewed.   ED Course  Procedures (including critical care time) Labs Review Labs Reviewed  BASIC METABOLIC PANEL - Abnormal; Notable for the following:    Glucose, Bld 105 (*)    All other components within normal limits  HEPATIC FUNCTION PANEL - Abnormal; Notable for the following:    Bilirubin, Direct <0.1 (*)    All other components within normal limits  CBC  TROPONIN I  LIPASE, BLOOD    Imaging Review Dg Chest 2 View  08/18/2015  CLINICAL DATA:  Chest pain for 3 days EXAM: CHEST  2 VIEW COMPARISON:  None. FINDINGS: Lungs are clear. Heart size and pulmonary vascularity are normal. No adenopathy. There is mid thoracic levoscoliosis. No pneumothorax. IMPRESSION: No edema or consolidation. Electronically Signed   By: Bretta BangWilliam  Woodruff III M.D.   On: 08/18/2015 20:15   I have personally reviewed and evaluated these images  and lab results as part of my medical decision-making.   EKG Interpretation   Date/Time:  Saturday August 18 2015 19:47:17 EST Ventricular Rate:  78 PR Interval:  150 QRS Duration: 78 QT Interval:  376 QTC Calculation: 428 R Axis:   11 Text Interpretation:  Normal sinus rhythm Cannot rule out Anterior infarct  , age undetermined Abnormal ECG No significant change was found Confirmed  by Manus GunningANCOUR  MD, STEPHEN [phone]([id number]) on 08/18/2015 7:56:41 PM      Vital signs reviewed and are as follows: Filed Vitals:   08/18/15 2200 08/18/15 2230  BP: 132/88 122/72  Pulse: 73 74  Temp:    Resp: 17 9   Patient updated on all results. Workup in emergency department is widely unrevealing. Chest x-ray is clear. Troponin is negative. EKG does not show any acute ischemic changes. This is unchanged from previous EKG as well. Hepatic function panel and lipase are normal. No right upper quadrant pain to suggest cholecystitis or cholelithiasis. Patient was offered GI cocktail but declines because the medicine is liquid.  At this point, do not feel further workup is indicated. Feel patient is safe for  discharge home. Low risk for ACS, PE. Question whether this is GI in nature or not. Patient to continue PPI and follow up with her doctor. May be related to patient's new statin, however she has not had generalized muscle aches. Do not suspect rhabdomyolysis. Kidney function is normal.  Patient was counseled to return with severe chest pain, especially if the pain is crushing or pressure-like and spreads to the arms, back, neck, or jaw, or if they have sweating, nausea, or shortness of breath with the pain. They were encouraged to call 911 with these symptoms.   They were also told to return if their chest pain gets worse and does not go away with rest, they have an attack of chest pain lasting longer than usual despite rest and treatment with the medications their caregiver has prescribed, if they wake from sleep  with chest pain or shortness of breath, if they feel dizzy or faint, if they have chest pain not typical of their usual pain, or if they have any other emergent concerns regarding their health.  The patient was urged to return to the Emergency Department immediately with worsening of current symptoms, worsening abdominal pain, persistent vomiting, blood noted in stools, fever, or any other concerns. The patient verbalized understanding.      MDM   Final diagnoses:  Chest pain, unspecified chest pain type   Patient with lower chest pain, upper abdominal pain with radiation to her back, although area of back pain is reproducible to palpation. As discussed above. No emergent etiology suspected this point. Workup was largely negative. No indications for admission at this point. The patient can be safely discharged home with PCP follow-up. Her heart score is equal to 1. Return instructions as above.   Renne Crigler, PA-C 08/18/15 2254  Glynn Octave, MD 08/18/15 [phone]

## 2015-08-18 NOTE — Discharge Instructions (Signed)
Please read and follow all provided instructions.  Your diagnoses today include:  1. Chest pain, unspecified chest pain type     Tests performed today include:  An EKG of your heart  A chest x-ray - normal  Cardiac enzymes - a blood test for heart muscle damage  Blood counts and electrolytes  Vital signs. See below for your results today.   Medications prescribed:   None  Take any prescribed medications only as directed.  Follow-up instructions: Please follow-up with your primary care provider this week for a recheck of your symptoms.   Return instructions:  SEEK IMMEDIATE MEDICAL ATTENTION IF:  You have severe chest pain, especially if the pain is crushing or pressure-like and spreads to the arms, back, neck, or jaw, or if you have sweating, nausea (feeling sick to your stomach), or shortness of breath. THIS IS AN EMERGENCY. Don't wait to see if the pain will go away. Get medical help at once. Call 911 or 0 (operator). DO NOT drive yourself to the hospital.   Your chest pain gets worse and does not go away with rest.   You have an attack of chest pain lasting longer than usual, despite rest and treatment with the medications your caregiver has prescribed.   You wake from sleep with chest pain or shortness of breath.  You feel dizzy or faint.  You have chest pain not typical of your usual pain for which you originally saw your caregiver.   You have any other emergent concerns regarding your health.  Additional Information: Chest pain comes from many different causes. Your caregiver has diagnosed you as having chest pain that is not specific for one problem, but does not require admission.  You are at low risk for an acute heart condition or other serious illness.   Your vital signs today were: BP 116/67 mmHg   Pulse 67   Temp(Src) 98.2 F (36.8 C) (Oral)   Resp 14   Ht 5\' 10"  (1.778 m)   Wt 145.151 kg   BMI 45.92 kg/m2   SpO2 100% If your blood pressure (BP) was  elevated above 135/85 this visit, please have this repeated by your doctor within one month. --------------

## 2015-08-18 NOTE — ED Notes (Addendum)
Denies questions or needs, VSS, no changes, ambulatory with steady gait, no dyspnea noted.

## 2015-08-18 NOTE — ED Notes (Signed)
EKG, labwork, CXR and VS reviewed. EDPA into room. VSS, NSR on monitor.

## 2015-08-18 NOTE — ED Notes (Signed)
Pt alert, NAD, calm, interactive, no dyspnea noted, denies change, "feel the same as on arrival, denies questions or needs, VSS, NSR on monitor, c/o chest pressure with intermittent radiation to back, denies other sx, "wondering if it is related to muscular manifestation d/t statin".

## 2015-08-18 NOTE — ED Notes (Signed)
Patient states that she started to have pain to her mid chest  / epigastric region about 3 days ago. Felt like it was acid reflux at first. Patient states that it now radiated to her back

## 2016-02-12 DIAGNOSIS — M62838 Other muscle spasm: Secondary | ICD-10-CM | POA: Diagnosis not present

## 2016-02-19 DIAGNOSIS — E119 Type 2 diabetes mellitus without complications: Secondary | ICD-10-CM | POA: Diagnosis not present

## 2016-02-20 DIAGNOSIS — E559 Vitamin D deficiency, unspecified: Secondary | ICD-10-CM | POA: Diagnosis not present

## 2016-02-21 DIAGNOSIS — H33312 Horseshoe tear of retina without detachment, left eye: Secondary | ICD-10-CM | POA: Diagnosis not present

## 2016-02-21 DIAGNOSIS — H52223 Regular astigmatism, bilateral: Secondary | ICD-10-CM | POA: Diagnosis not present

## 2016-02-21 DIAGNOSIS — E119 Type 2 diabetes mellitus without complications: Secondary | ICD-10-CM | POA: Diagnosis not present

## 2016-02-21 DIAGNOSIS — H35413 Lattice degeneration of retina, bilateral: Secondary | ICD-10-CM | POA: Diagnosis not present

## 2016-03-11 DIAGNOSIS — K080 Exfoliation of teeth due to systemic causes: Secondary | ICD-10-CM | POA: Diagnosis not present

## 2016-03-27 DIAGNOSIS — J329 Chronic sinusitis, unspecified: Secondary | ICD-10-CM | POA: Diagnosis not present

## 2016-04-04 DIAGNOSIS — K080 Exfoliation of teeth due to systemic causes: Secondary | ICD-10-CM | POA: Diagnosis not present

## 2016-08-18 DIAGNOSIS — E559 Vitamin D deficiency, unspecified: Secondary | ICD-10-CM | POA: Diagnosis not present

## 2016-08-18 DIAGNOSIS — Z6841 Body Mass Index (BMI) 40.0 and over, adult: Secondary | ICD-10-CM | POA: Diagnosis not present

## 2016-08-18 DIAGNOSIS — E119 Type 2 diabetes mellitus without complications: Secondary | ICD-10-CM | POA: Diagnosis not present

## 2016-12-18 DIAGNOSIS — A084 Viral intestinal infection, unspecified: Secondary | ICD-10-CM | POA: Diagnosis not present

## 2016-12-18 DIAGNOSIS — G43011 Migraine without aura, intractable, with status migrainosus: Secondary | ICD-10-CM | POA: Diagnosis not present

## 2016-12-19 ENCOUNTER — Encounter (HOSPITAL_BASED_OUTPATIENT_CLINIC_OR_DEPARTMENT_OTHER): Payer: Self-pay

## 2016-12-19 ENCOUNTER — Emergency Department (HOSPITAL_BASED_OUTPATIENT_CLINIC_OR_DEPARTMENT_OTHER)
Admission: EM | Admit: 2016-12-19 | Discharge: 2016-12-19 | Disposition: A | Discharge status: Home / Self Care | Payer: Federal, State, Local not specified - PPO | Attending: Emergency Medicine | Admitting: Emergency Medicine

## 2016-12-19 DIAGNOSIS — K529 Noninfective gastroenteritis and colitis, unspecified: Secondary | ICD-10-CM | POA: Insufficient documentation

## 2016-12-19 DIAGNOSIS — E119 Type 2 diabetes mellitus without complications: Secondary | ICD-10-CM | POA: Diagnosis not present

## 2016-12-19 DIAGNOSIS — R51 Headache: Secondary | ICD-10-CM | POA: Diagnosis not present

## 2016-12-19 DIAGNOSIS — R509 Fever, unspecified: Secondary | ICD-10-CM | POA: Diagnosis not present

## 2016-12-19 DIAGNOSIS — E860 Dehydration: Secondary | ICD-10-CM | POA: Diagnosis not present

## 2016-12-19 DIAGNOSIS — R55 Syncope and collapse: Secondary | ICD-10-CM | POA: Diagnosis not present

## 2016-12-19 DIAGNOSIS — Z79899 Other long term (current) drug therapy: Secondary | ICD-10-CM | POA: Diagnosis not present

## 2016-12-19 DIAGNOSIS — J45909 Unspecified asthma, uncomplicated: Secondary | ICD-10-CM | POA: Insufficient documentation

## 2016-12-19 DIAGNOSIS — R110 Nausea: Secondary | ICD-10-CM | POA: Diagnosis present

## 2016-12-19 DIAGNOSIS — A084 Viral intestinal infection, unspecified: Secondary | ICD-10-CM | POA: Diagnosis not present

## 2016-12-19 HISTORY — DX: Dehydration: E86.0

## 2016-12-19 HISTORY — DX: Syncope and collapse: R55

## 2016-12-19 HISTORY — DX: Type 2 diabetes mellitus without complications: E11.9

## 2016-12-19 LAB — COMPREHENSIVE METABOLIC PANEL
ALBUMIN: 3.6 g/dL (ref 3.5–5.0)
ALT: 15 U/L (ref 14–54)
AST: 13 U/L — ABNORMAL LOW (ref 15–41)
Alkaline Phosphatase: 57 U/L (ref 38–126)
Anion gap: 11 (ref 5–15)
BUN: 19 mg/dL (ref 6–20)
CALCIUM: 8 mg/dL — AB (ref 8.9–10.3)
CO2: 22 mmol/L (ref 22–32)
CREATININE: 0.97 mg/dL (ref 0.44–1.00)
Chloride: 103 mmol/L (ref 101–111)
GFR calc Af Amer: >60 mL/min (ref >60)
GFR calc non Af Amer: >60 mL/min (ref >60)
GLUCOSE: 143 mg/dL — AB (ref 65–99)
Potassium: 3.4 mmol/L — ABNORMAL LOW (ref 3.5–5.1)
SODIUM: 136 mmol/L (ref 135–145)
Total Bilirubin: 0.8 mg/dL (ref 0.3–1.2)
Total Protein: 7 g/dL (ref 6.5–8.1)

## 2016-12-19 LAB — URINALYSIS, ROUTINE W REFLEX MICROSCOPIC
BILIRUBIN URINE: NEGATIVE
Glucose, UA: NEGATIVE mg/dL
HGB URINE DIPSTICK: NEGATIVE
Ketones, ur: NEGATIVE mg/dL
Nitrite: NEGATIVE
PH: 6 (ref 5.0–8.0)
Protein, ur: 30 mg/dL — AB
SPECIFIC GRAVITY, URINE: 1.023 (ref 1.005–1.030)

## 2016-12-19 LAB — URINALYSIS, MICROSCOPIC (REFLEX)

## 2016-12-19 LAB — CBC WITH DIFFERENTIAL/PLATELET
BASOS PCT: 0 %
Basophils Absolute: 0 10*3/uL (ref 0.0–0.1)
EOS ABS: 0 10*3/uL (ref 0.0–0.7)
Eosinophils Relative: 0 %
HCT: 36.7 % (ref 36.0–46.0)
Hemoglobin: 12.6 g/dL (ref 12.0–15.0)
Lymphocytes Relative: 10 %
Lymphs Abs: 1 10*3/uL (ref 0.7–4.0)
MCH: 30 pg (ref 26.0–34.0)
MCHC: 34.3 g/dL (ref 30.0–36.0)
MCV: 87.4 fL (ref 78.0–100.0)
Monocytes Absolute: 0.7 10*3/uL (ref 0.1–1.0)
Monocytes Relative: 7 %
NEUTROS PCT: 83 %
Neutro Abs: 8 10*3/uL — ABNORMAL HIGH (ref 1.7–7.7)
Platelets: 270 10*3/uL (ref 150–400)
RBC: 4.2 MIL/uL (ref 3.87–5.11)
RDW: 13.1 % (ref 11.5–15.5)
WBC: 9.7 10*3/uL (ref 4.0–10.5)

## 2016-12-19 LAB — CBG MONITORING, ED: Glucose-Capillary: 139 mg/dL — ABNORMAL HIGH (ref 65–99)

## 2016-12-19 LAB — TROPONIN I: Troponin I: <0.03 ng/mL (ref <0.03)

## 2016-12-19 LAB — PREGNANCY, URINE: Preg Test, Ur: NEGATIVE

## 2016-12-19 MED ORDER — SODIUM CHLORIDE 0.9 % IV BOLUS (SEPSIS)
1000.0000 mL | Freq: Once | INTRAVENOUS | Status: AC
Start: 2016-12-19 — End: 2016-12-19
  Administered 2016-12-19: 1000 mL via INTRAVENOUS

## 2016-12-19 MED ORDER — ONDANSETRON HCL 4 MG/2ML IJ SOLN
4.0000 mg | Freq: Once | INTRAMUSCULAR | Status: AC
  Administered 2016-12-19: 4 mg via INTRAVENOUS
  Filled 2016-12-19: qty 2

## 2016-12-19 MED ORDER — SODIUM CHLORIDE 0.9 % IV BOLUS (SEPSIS)
2000.0000 mL | Freq: Once | INTRAVENOUS | Status: AC
  Administered 2016-12-19 (×2): 1000 mL via INTRAVENOUS

## 2016-12-19 NOTE — ED Provider Notes (Signed)
[facility] Provider Note: Lowella Dell, MD, FACEP  CSN: [id number] MRN: [id number] ARRIVAL: 12/19/16 at 0004 ROOM: [facility]   CHIEF COMPLAINT  Hypotension   HISTORY OF PRESENT ILLNESS  Aimee Hall is a 46 y.o. female who awakened yesterday morning about 4 AM with nausea and general malaise. The nausea progressed through the morning. She had profuse watery diarrhea throughout the day. She is not aware of there being blood in it. She went to work and developed a headache which she describes as a migraine. She was seen at an urgent care about 8 PM and was given shots of Phenergan and Toradol with relief of her headache an improvement in her nausea. About 11:30 PM she got up to go to the bathroom feeling the need to vomit. On the way she had a syncopal episode. She fell across an entertainment center striking her abdomen. She is having epigastric pain and tenderness which may be related to the fall or may be related to her illness. She was brought to the ED by a family member. She has had a fever as high as 103 today. She has had periods of feeling like her entire body was on fire. She complains of her mouth being dry. She had another near syncopal episode in triage and was noted to be hypotensive (as low as 52/40) and orthostatic on arrival. She relates her symptoms as being similar to norovirus in the past. An IV fluid bolus was initiated with improvement in her blood pressure.   Past Medical History:  Diagnosis Date  . Asthma   . Diabetes mellitus without complication (HCC)   . GERD (gastroesophageal reflux disease)     History reviewed. No pertinent surgical history.  No family history on file.  Social History  Substance Use Topics  . Smoking status: Never Smoker  . Smokeless tobacco: Not on file  . Alcohol use No    Prior to Admission medications   Medication Sig Start Date End Date Taking? Authorizing Provider  bisacodyl (DULCOLAX) 10 MG suppository Place 10  mg rectally as needed for moderate constipation.    [provider]  fexofenadine (ALLEGRA) 60 MG tablet Take 1 tablet (60 mg total) by mouth 2 (two) times daily. 10/29/13   Derwood Kaplan, MD  ibuprofen (ADVIL,MOTRIN) 400 MG tablet Take 1 tablet (400 mg total) by mouth every 6 (six) hours as needed. 10/29/13   Derwood Kaplan, MD  OMEPRAZOLE PO Take by mouth.    [provider]  traMADol (ULTRAM) 50 MG tablet Take 50 mg by mouth every 6 (six) hours as needed.    [provider]    Allergies Dilaudid [hydromorphone]; Iodinated diagnostic agents; and Shellfish allergy   REVIEW OF SYSTEMS  Negative except as noted here or in the History of Present Illness.   PHYSICAL EXAMINATION  Initial Vital Signs Blood pressure (!) 74/45, pulse 94, temperature 98.9 F (37.2 C), temperature source Oral, resp. rate 18, height 5\' 10"  (1.778 m), weight (!) 313 lb (142 kg), SpO2 100 %.  Examination General: Well-developed, obese female in no acute distress; appearance consistent with age of record HENT: normocephalic; atraumatic; mucous membranes dry Eyes: pupils equal, round and reactive to light; extraocular muscles intact Neck: supple Heart: regular rate and rhythm Lungs: clear to auscultation bilaterally Abdomen: soft; obese; epigastric tenderness; bowel sounds present Extremities: No deformity; full range of motion; pulses normal Neurologic: Awake, alert and oriented; motor function intact in all extremities and symmetric; no facial droop  Skin: Warm and dry Psychiatric: Normal mood and affect   RESULTS  Summary of this visit's results, reviewed by myself:   EKG Interpretation  Date/Time:  Friday Dec 19 2016 00:22:47 EDT Ventricular Rate:  85 PR Interval:    QRS Duration: 96 QT Interval:  383 QTC Calculation: 456 R Axis:   26 Text Interpretation:  Sinus rhythm Low voltage, precordial leads Abnormal R-wave progression, early transition Baseline wander in lead(s)  II III aVF No significant change was found Confirmed by Paula Libra ([id number]) on 12/19/2016 12:38:24 AM      Laboratory Studies: Results for orders placed or performed during the hospital encounter of 12/19/16 (from the past 24 hour(s))  POC CBG, ED     Status: Abnormal   Collection Time: 12/19/16 12:16 AM  Result Value Ref Range   Glucose-Capillary 139 (H) 65 - 99 mg/dL  CBC with Differential     Status: Abnormal   Collection Time: 12/19/16 12:25 AM  Result Value Ref Range   WBC 9.7 4.0 - 10.5 K/uL   RBC 4.20 3.87 - 5.11 MIL/uL   Hemoglobin 12.6 12.0 - 15.0 g/dL   HCT 09.8 11.9 - 14.7 %   MCV 87.4 78.0 - 100.0 fL   MCH 30.0 26.0 - 34.0 pg   MCHC 34.3 30.0 - 36.0 g/dL   RDW 82.9 56.2 - 13.0 %   Platelets 270 150 - 400 K/uL   Neutrophils Relative % 83 %   Neutro Abs 8.0 (H) 1.7 - 7.7 K/uL   Lymphocytes Relative 10 %   Lymphs Abs 1.0 0.7 - 4.0 K/uL   Monocytes Relative 7 %   Monocytes Absolute 0.7 0.1 - 1.0 K/uL   Eosinophils Relative 0 %   Eosinophils Absolute 0.0 0.0 - 0.7 K/uL   Basophils Relative 0 %   Basophils Absolute 0.0 0.0 - 0.1 K/uL  Comprehensive metabolic panel     Status: Abnormal   Collection Time: 12/19/16 12:25 AM  Result Value Ref Range   Sodium 136 135 - 145 mmol/L   Potassium 3.4 (L) 3.5 - 5.1 mmol/L   Chloride 103 101 - 111 mmol/L   CO2 22 22 - 32 mmol/L   Glucose, Bld 143 (H) 65 - 99 mg/dL   BUN 19 6 - 20 mg/dL   Creatinine, Ser 8.65 0.44 - 1.00 mg/dL   Calcium 8.0 (L) 8.9 - 10.3 mg/dL   Total Protein 7.0 6.5 - 8.1 g/dL   Albumin 3.6 3.5 - 5.0 g/dL   AST 13 (L) 15 - 41 U/L   ALT 15 14 - 54 U/L   Alkaline Phosphatase 57 38 - 126 U/L   Total Bilirubin 0.8 0.3 - 1.2 mg/dL   GFR calc non Af Amer >60 >60 mL/min   GFR calc Af Amer >60 >60 mL/min   Anion gap 11 5 - 15  Troponin I     Status: None   Collection Time: 12/19/16 12:25 AM  Result Value Ref Range   Troponin I <0.03 <0.03 ng/mL  Urinalysis, Routine w reflex microscopic     Status: Abnormal     Collection Time: 12/19/16  3:45 AM  Result Value Ref Range   Color, Urine YELLOW YELLOW   APPearance CLOUDY (A) CLEAR   Specific Gravity, Urine 1.023 1.005 - 1.030   pH 6.0 5.0 - 8.0   Glucose, UA NEGATIVE NEGATIVE mg/dL   Hgb urine dipstick NEGATIVE NEGATIVE   Bilirubin Urine NEGATIVE NEGATIVE   Ketones, ur NEGATIVE NEGATIVE mg/dL  Protein, ur 30 (A) NEGATIVE mg/dL   Nitrite NEGATIVE NEGATIVE   Leukocytes, UA SMALL (A) NEGATIVE  Pregnancy, urine     Status: None   Collection Time: 12/19/16  3:45 AM  Result Value Ref Range   Preg Test, Ur NEGATIVE NEGATIVE  Urinalysis, Microscopic (reflex)     Status: Abnormal   Collection Time: 12/19/16  3:45 AM  Result Value Ref Range   RBC / HPF 0-5 0 - 5 RBC/hpf   WBC, UA 0-5 0 - 5 WBC/hpf   Bacteria, UA MANY (A) NONE SEEN   Squamous Epithelial / LPF 6-30 (A) NONE SEEN   Mucous PRESENT    Hyaline Casts, UA PRESENT    Imaging Studies: No results found.  ED COURSE  Nursing notes and initial vitals signs, including pulse oximetry, reviewed.  Vitals:   12/19/16 0020 12/19/16 0200 12/19/16 0201 12/19/16 0346  BP: (!) 74/45 119/81 119/81 109/63  Pulse:  88 90 85  Resp:  18 18 16   Temp:      TempSrc:      SpO2:  97% 99% 98%  Weight:      Height:       4:25 AM Blood pressures normalized after 3 liter normal saline bolus. Patient has ambulated without difficulty. I suspect her syncopal episode was a combination of fluid loss from diarrhea and vasovagal effect of preparing to vomit. She has Zofran at home. She was advised to take over-the-counter Imodium as needed for diarrhea.  PROCEDURES    ED DIAGNOSES     ICD-9-CM ICD-10-CM   1. Viral gastroenteritis 008.8 A08.4   2. Dehydration 276.51 E86.0   3. Vasovagal syncope 780.2 R55        Dannis Deroche, Jonny RuizJohn, MD 12/19/16 [phone]

## 2016-12-19 NOTE — ED Triage Notes (Signed)
Pt states she woke with nausea and vomiting all day, went to work started to get a headache, saw a doctor at [id number] tonight and was given a shot of phenergan and toradol and went home.  She woke up and tried to go to the bathroom and woke up on the floor.  Pt had a moment of LOC in triage as well for approximately 2 seconds, she got very hot and then her BP dropped and pt was alert to verbal stimuli.  Pt c/o dull head pain at the moment with side pain from where she hit the entertainment center as she fell.

## 2016-12-19 NOTE — ED Notes (Signed)
Attempted IV to left AC without success.

## 2017-02-17 DIAGNOSIS — K080 Exfoliation of teeth due to systemic causes: Secondary | ICD-10-CM | POA: Diagnosis not present

## 2017-03-31 ENCOUNTER — Other Ambulatory Visit: Payer: Self-pay | Admitting: Obstetrics and Gynecology

## 2017-03-31 DIAGNOSIS — Z1231 Encounter for screening mammogram for malignant neoplasm of breast: Secondary | ICD-10-CM

## 2017-04-10 ENCOUNTER — Ambulatory Visit
Admission: RE | Admit: 2017-04-10 | Discharge: 2017-04-10 | Disposition: A | Discharge status: Home / Self Care | Payer: Federal, State, Local not specified - PPO | Source: Ambulatory Visit | Attending: Obstetrics and Gynecology | Admitting: Obstetrics and Gynecology

## 2017-04-10 DIAGNOSIS — Z1231 Encounter for screening mammogram for malignant neoplasm of breast: Secondary | ICD-10-CM

## 2017-08-11 DIAGNOSIS — J329 Chronic sinusitis, unspecified: Secondary | ICD-10-CM | POA: Diagnosis not present

## 2017-08-11 DIAGNOSIS — Z23 Encounter for immunization: Secondary | ICD-10-CM | POA: Diagnosis not present

## 2017-09-29 DIAGNOSIS — H16293 Other keratoconjunctivitis, bilateral: Secondary | ICD-10-CM | POA: Diagnosis not present

## 2017-10-12 DIAGNOSIS — E7800 Pure hypercholesterolemia, unspecified: Secondary | ICD-10-CM | POA: Diagnosis not present

## 2017-10-12 DIAGNOSIS — E119 Type 2 diabetes mellitus without complications: Secondary | ICD-10-CM | POA: Diagnosis not present

## 2017-10-12 DIAGNOSIS — K219 Gastro-esophageal reflux disease without esophagitis: Secondary | ICD-10-CM | POA: Diagnosis not present

## 2017-12-16 DIAGNOSIS — K080 Exfoliation of teeth due to systemic causes: Secondary | ICD-10-CM | POA: Diagnosis not present

## 2017-12-30 DIAGNOSIS — F4310 Post-traumatic stress disorder, unspecified: Secondary | ICD-10-CM | POA: Diagnosis not present

## 2018-01-07 DIAGNOSIS — F4310 Post-traumatic stress disorder, unspecified: Secondary | ICD-10-CM | POA: Diagnosis not present

## 2018-02-05 DIAGNOSIS — R51 Headache: Secondary | ICD-10-CM | POA: Diagnosis not present

## 2018-05-05 DIAGNOSIS — E7800 Pure hypercholesterolemia, unspecified: Secondary | ICD-10-CM | POA: Diagnosis not present

## 2018-05-05 DIAGNOSIS — E559 Vitamin D deficiency, unspecified: Secondary | ICD-10-CM | POA: Diagnosis not present

## 2018-05-05 DIAGNOSIS — Z23 Encounter for immunization: Secondary | ICD-10-CM | POA: Diagnosis not present

## 2018-05-05 DIAGNOSIS — E119 Type 2 diabetes mellitus without complications: Secondary | ICD-10-CM | POA: Diagnosis not present

## 2018-05-05 DIAGNOSIS — K219 Gastro-esophageal reflux disease without esophagitis: Secondary | ICD-10-CM | POA: Diagnosis not present

## 2018-05-05 DIAGNOSIS — Z0000 Encounter for general adult medical examination without abnormal findings: Secondary | ICD-10-CM | POA: Diagnosis not present

## 2018-06-22 DIAGNOSIS — M25511 Pain in right shoulder: Secondary | ICD-10-CM | POA: Diagnosis not present

## 2018-06-22 DIAGNOSIS — M545 Low back pain: Secondary | ICD-10-CM | POA: Diagnosis not present

## 2018-06-22 DIAGNOSIS — M542 Cervicalgia: Secondary | ICD-10-CM | POA: Diagnosis not present

## 2018-06-25 DIAGNOSIS — M25511 Pain in right shoulder: Secondary | ICD-10-CM | POA: Diagnosis not present

## 2018-06-25 DIAGNOSIS — M542 Cervicalgia: Secondary | ICD-10-CM | POA: Diagnosis not present

## 2018-06-25 DIAGNOSIS — M545 Low back pain: Secondary | ICD-10-CM | POA: Diagnosis not present

## 2018-06-29 DIAGNOSIS — M545 Low back pain: Secondary | ICD-10-CM | POA: Diagnosis not present

## 2018-06-29 DIAGNOSIS — M542 Cervicalgia: Secondary | ICD-10-CM | POA: Diagnosis not present

## 2018-06-29 DIAGNOSIS — M799 Soft tissue disorder, unspecified: Secondary | ICD-10-CM | POA: Diagnosis not present

## 2018-06-29 DIAGNOSIS — M25511 Pain in right shoulder: Secondary | ICD-10-CM | POA: Diagnosis not present

## 2018-07-06 DIAGNOSIS — M25511 Pain in right shoulder: Secondary | ICD-10-CM | POA: Diagnosis not present

## 2018-07-06 DIAGNOSIS — M545 Low back pain: Secondary | ICD-10-CM | POA: Diagnosis not present

## 2018-07-06 DIAGNOSIS — M799 Soft tissue disorder, unspecified: Secondary | ICD-10-CM | POA: Diagnosis not present

## 2018-07-06 DIAGNOSIS — M542 Cervicalgia: Secondary | ICD-10-CM | POA: Diagnosis not present

## 2018-07-08 DIAGNOSIS — M799 Soft tissue disorder, unspecified: Secondary | ICD-10-CM | POA: Diagnosis not present

## 2018-07-08 DIAGNOSIS — M545 Low back pain: Secondary | ICD-10-CM | POA: Diagnosis not present

## 2018-07-08 DIAGNOSIS — M542 Cervicalgia: Secondary | ICD-10-CM | POA: Diagnosis not present

## 2018-07-08 DIAGNOSIS — M25511 Pain in right shoulder: Secondary | ICD-10-CM | POA: Diagnosis not present

## 2018-07-13 DIAGNOSIS — M799 Soft tissue disorder, unspecified: Secondary | ICD-10-CM | POA: Diagnosis not present

## 2018-07-13 DIAGNOSIS — M542 Cervicalgia: Secondary | ICD-10-CM | POA: Diagnosis not present

## 2018-07-13 DIAGNOSIS — M25511 Pain in right shoulder: Secondary | ICD-10-CM | POA: Diagnosis not present

## 2018-07-13 DIAGNOSIS — M545 Low back pain: Secondary | ICD-10-CM | POA: Diagnosis not present

## 2018-07-15 DIAGNOSIS — M545 Low back pain: Secondary | ICD-10-CM | POA: Diagnosis not present

## 2018-07-15 DIAGNOSIS — M799 Soft tissue disorder, unspecified: Secondary | ICD-10-CM | POA: Diagnosis not present

## 2018-07-15 DIAGNOSIS — M542 Cervicalgia: Secondary | ICD-10-CM | POA: Diagnosis not present

## 2018-07-15 DIAGNOSIS — M25511 Pain in right shoulder: Secondary | ICD-10-CM | POA: Diagnosis not present

## 2018-07-16 DIAGNOSIS — M545 Low back pain: Secondary | ICD-10-CM | POA: Diagnosis not present

## 2018-07-16 DIAGNOSIS — M799 Soft tissue disorder, unspecified: Secondary | ICD-10-CM | POA: Diagnosis not present

## 2018-07-16 DIAGNOSIS — M542 Cervicalgia: Secondary | ICD-10-CM | POA: Diagnosis not present

## 2018-07-16 DIAGNOSIS — M25511 Pain in right shoulder: Secondary | ICD-10-CM | POA: Diagnosis not present

## 2018-07-23 DIAGNOSIS — M542 Cervicalgia: Secondary | ICD-10-CM | POA: Diagnosis not present

## 2018-07-23 DIAGNOSIS — M549 Dorsalgia, unspecified: Secondary | ICD-10-CM | POA: Diagnosis not present

## 2018-07-23 DIAGNOSIS — M545 Low back pain: Secondary | ICD-10-CM | POA: Diagnosis not present

## 2018-07-23 DIAGNOSIS — M799 Soft tissue disorder, unspecified: Secondary | ICD-10-CM | POA: Diagnosis not present

## 2018-07-23 DIAGNOSIS — M25511 Pain in right shoulder: Secondary | ICD-10-CM | POA: Diagnosis not present

## 2018-07-26 DIAGNOSIS — M799 Soft tissue disorder, unspecified: Secondary | ICD-10-CM | POA: Diagnosis not present

## 2018-07-26 DIAGNOSIS — M542 Cervicalgia: Secondary | ICD-10-CM | POA: Diagnosis not present

## 2018-07-26 DIAGNOSIS — M545 Low back pain: Secondary | ICD-10-CM | POA: Diagnosis not present

## 2018-07-26 DIAGNOSIS — M25511 Pain in right shoulder: Secondary | ICD-10-CM | POA: Diagnosis not present

## 2018-07-30 DIAGNOSIS — M542 Cervicalgia: Secondary | ICD-10-CM | POA: Diagnosis not present

## 2018-07-30 DIAGNOSIS — M25511 Pain in right shoulder: Secondary | ICD-10-CM | POA: Diagnosis not present

## 2018-07-30 DIAGNOSIS — M545 Low back pain: Secondary | ICD-10-CM | POA: Diagnosis not present

## 2018-07-30 DIAGNOSIS — M799 Soft tissue disorder, unspecified: Secondary | ICD-10-CM | POA: Diagnosis not present

## 2018-08-03 DIAGNOSIS — M799 Soft tissue disorder, unspecified: Secondary | ICD-10-CM | POA: Diagnosis not present

## 2018-08-03 DIAGNOSIS — M542 Cervicalgia: Secondary | ICD-10-CM | POA: Diagnosis not present

## 2018-08-03 DIAGNOSIS — M25511 Pain in right shoulder: Secondary | ICD-10-CM | POA: Diagnosis not present

## 2018-08-03 DIAGNOSIS — M545 Low back pain: Secondary | ICD-10-CM | POA: Diagnosis not present

## 2018-08-06 DIAGNOSIS — M545 Low back pain: Secondary | ICD-10-CM | POA: Diagnosis not present

## 2018-08-06 DIAGNOSIS — M542 Cervicalgia: Secondary | ICD-10-CM | POA: Diagnosis not present

## 2018-08-10 DIAGNOSIS — L089 Local infection of the skin and subcutaneous tissue, unspecified: Secondary | ICD-10-CM | POA: Diagnosis not present

## 2018-08-10 DIAGNOSIS — M25511 Pain in right shoulder: Secondary | ICD-10-CM | POA: Diagnosis not present

## 2018-08-10 DIAGNOSIS — M542 Cervicalgia: Secondary | ICD-10-CM | POA: Diagnosis not present

## 2018-08-10 DIAGNOSIS — M545 Low back pain: Secondary | ICD-10-CM | POA: Diagnosis not present

## 2018-08-10 DIAGNOSIS — M799 Soft tissue disorder, unspecified: Secondary | ICD-10-CM | POA: Diagnosis not present

## 2018-08-11 DIAGNOSIS — E559 Vitamin D deficiency, unspecified: Secondary | ICD-10-CM | POA: Diagnosis not present

## 2018-08-11 DIAGNOSIS — E1169 Type 2 diabetes mellitus with other specified complication: Secondary | ICD-10-CM | POA: Diagnosis not present

## 2018-08-11 DIAGNOSIS — J301 Allergic rhinitis due to pollen: Secondary | ICD-10-CM | POA: Diagnosis not present

## 2018-08-11 DIAGNOSIS — K219 Gastro-esophageal reflux disease without esophagitis: Secondary | ICD-10-CM | POA: Diagnosis not present

## 2018-08-11 DIAGNOSIS — E7800 Pure hypercholesterolemia, unspecified: Secondary | ICD-10-CM | POA: Diagnosis not present

## 2018-08-12 DIAGNOSIS — M25511 Pain in right shoulder: Secondary | ICD-10-CM | POA: Diagnosis not present

## 2018-08-12 DIAGNOSIS — M545 Low back pain: Secondary | ICD-10-CM | POA: Diagnosis not present

## 2018-08-12 DIAGNOSIS — L02419 Cutaneous abscess of limb, unspecified: Secondary | ICD-10-CM | POA: Diagnosis not present

## 2018-08-12 DIAGNOSIS — M799 Soft tissue disorder, unspecified: Secondary | ICD-10-CM | POA: Diagnosis not present

## 2018-08-12 DIAGNOSIS — M542 Cervicalgia: Secondary | ICD-10-CM | POA: Diagnosis not present

## 2018-08-13 DIAGNOSIS — M549 Dorsalgia, unspecified: Secondary | ICD-10-CM | POA: Diagnosis not present

## 2018-08-13 DIAGNOSIS — E119 Type 2 diabetes mellitus without complications: Secondary | ICD-10-CM | POA: Diagnosis not present

## 2018-08-13 DIAGNOSIS — S161XXD Strain of muscle, fascia and tendon at neck level, subsequent encounter: Secondary | ICD-10-CM | POA: Diagnosis not present

## 2018-08-13 DIAGNOSIS — S39012D Strain of muscle, fascia and tendon of lower back, subsequent encounter: Secondary | ICD-10-CM | POA: Diagnosis not present

## 2018-08-19 DIAGNOSIS — M542 Cervicalgia: Secondary | ICD-10-CM | POA: Diagnosis not present

## 2018-08-19 DIAGNOSIS — M545 Low back pain: Secondary | ICD-10-CM | POA: Diagnosis not present

## 2018-08-19 DIAGNOSIS — M25511 Pain in right shoulder: Secondary | ICD-10-CM | POA: Diagnosis not present

## 2018-08-19 DIAGNOSIS — M799 Soft tissue disorder, unspecified: Secondary | ICD-10-CM | POA: Diagnosis not present

## 2018-08-24 DIAGNOSIS — M545 Low back pain: Secondary | ICD-10-CM | POA: Diagnosis not present

## 2018-08-24 DIAGNOSIS — M799 Soft tissue disorder, unspecified: Secondary | ICD-10-CM | POA: Diagnosis not present

## 2018-08-24 DIAGNOSIS — M25511 Pain in right shoulder: Secondary | ICD-10-CM | POA: Diagnosis not present

## 2018-08-24 DIAGNOSIS — M542 Cervicalgia: Secondary | ICD-10-CM | POA: Diagnosis not present

## 2018-08-26 DIAGNOSIS — M25511 Pain in right shoulder: Secondary | ICD-10-CM | POA: Diagnosis not present

## 2018-08-26 DIAGNOSIS — M545 Low back pain: Secondary | ICD-10-CM | POA: Diagnosis not present

## 2018-08-26 DIAGNOSIS — M542 Cervicalgia: Secondary | ICD-10-CM | POA: Diagnosis not present

## 2018-08-26 DIAGNOSIS — M799 Soft tissue disorder, unspecified: Secondary | ICD-10-CM | POA: Diagnosis not present

## 2018-08-31 DIAGNOSIS — M799 Soft tissue disorder, unspecified: Secondary | ICD-10-CM | POA: Diagnosis not present

## 2018-08-31 DIAGNOSIS — M25511 Pain in right shoulder: Secondary | ICD-10-CM | POA: Diagnosis not present

## 2018-08-31 DIAGNOSIS — M542 Cervicalgia: Secondary | ICD-10-CM | POA: Diagnosis not present

## 2018-08-31 DIAGNOSIS — M545 Low back pain: Secondary | ICD-10-CM | POA: Diagnosis not present

## 2018-09-02 DIAGNOSIS — M25511 Pain in right shoulder: Secondary | ICD-10-CM | POA: Diagnosis not present

## 2018-09-02 DIAGNOSIS — M799 Soft tissue disorder, unspecified: Secondary | ICD-10-CM | POA: Diagnosis not present

## 2018-09-02 DIAGNOSIS — M542 Cervicalgia: Secondary | ICD-10-CM | POA: Diagnosis not present

## 2018-09-02 DIAGNOSIS — M545 Low back pain: Secondary | ICD-10-CM | POA: Diagnosis not present

## 2018-09-07 DIAGNOSIS — M799 Soft tissue disorder, unspecified: Secondary | ICD-10-CM | POA: Diagnosis not present

## 2018-09-07 DIAGNOSIS — M542 Cervicalgia: Secondary | ICD-10-CM | POA: Diagnosis not present

## 2018-09-07 DIAGNOSIS — M545 Low back pain: Secondary | ICD-10-CM | POA: Diagnosis not present

## 2018-09-07 DIAGNOSIS — M25511 Pain in right shoulder: Secondary | ICD-10-CM | POA: Diagnosis not present

## 2018-09-09 DIAGNOSIS — M542 Cervicalgia: Secondary | ICD-10-CM | POA: Diagnosis not present

## 2018-09-09 DIAGNOSIS — M545 Low back pain: Secondary | ICD-10-CM | POA: Diagnosis not present

## 2018-09-09 DIAGNOSIS — M25511 Pain in right shoulder: Secondary | ICD-10-CM | POA: Diagnosis not present

## 2018-09-09 DIAGNOSIS — M799 Soft tissue disorder, unspecified: Secondary | ICD-10-CM | POA: Diagnosis not present

## 2018-09-10 DIAGNOSIS — S161XXD Strain of muscle, fascia and tendon at neck level, subsequent encounter: Secondary | ICD-10-CM | POA: Diagnosis not present

## 2018-09-10 DIAGNOSIS — S39012D Strain of muscle, fascia and tendon of lower back, subsequent encounter: Secondary | ICD-10-CM | POA: Diagnosis not present

## 2018-09-10 DIAGNOSIS — E119 Type 2 diabetes mellitus without complications: Secondary | ICD-10-CM | POA: Diagnosis not present

## 2018-09-10 DIAGNOSIS — M549 Dorsalgia, unspecified: Secondary | ICD-10-CM | POA: Diagnosis not present

## 2018-09-14 DIAGNOSIS — M542 Cervicalgia: Secondary | ICD-10-CM | POA: Diagnosis not present

## 2018-09-14 DIAGNOSIS — M799 Soft tissue disorder, unspecified: Secondary | ICD-10-CM | POA: Diagnosis not present

## 2018-09-14 DIAGNOSIS — M545 Low back pain: Secondary | ICD-10-CM | POA: Diagnosis not present

## 2018-09-14 DIAGNOSIS — M25511 Pain in right shoulder: Secondary | ICD-10-CM | POA: Diagnosis not present

## 2018-09-16 DIAGNOSIS — M545 Low back pain: Secondary | ICD-10-CM | POA: Diagnosis not present

## 2018-09-16 DIAGNOSIS — M25511 Pain in right shoulder: Secondary | ICD-10-CM | POA: Diagnosis not present

## 2018-09-16 DIAGNOSIS — M542 Cervicalgia: Secondary | ICD-10-CM | POA: Diagnosis not present

## 2018-09-16 DIAGNOSIS — M799 Soft tissue disorder, unspecified: Secondary | ICD-10-CM | POA: Diagnosis not present

## 2018-09-23 DIAGNOSIS — M545 Low back pain: Secondary | ICD-10-CM | POA: Diagnosis not present

## 2018-09-23 DIAGNOSIS — M25511 Pain in right shoulder: Secondary | ICD-10-CM | POA: Diagnosis not present

## 2018-09-23 DIAGNOSIS — M542 Cervicalgia: Secondary | ICD-10-CM | POA: Diagnosis not present

## 2018-09-23 DIAGNOSIS — M799 Soft tissue disorder, unspecified: Secondary | ICD-10-CM | POA: Diagnosis not present

## 2018-09-30 DIAGNOSIS — M25511 Pain in right shoulder: Secondary | ICD-10-CM | POA: Diagnosis not present

## 2018-09-30 DIAGNOSIS — M799 Soft tissue disorder, unspecified: Secondary | ICD-10-CM | POA: Diagnosis not present

## 2018-09-30 DIAGNOSIS — M545 Low back pain: Secondary | ICD-10-CM | POA: Diagnosis not present

## 2018-09-30 DIAGNOSIS — M542 Cervicalgia: Secondary | ICD-10-CM | POA: Diagnosis not present

## 2018-10-05 DIAGNOSIS — M799 Soft tissue disorder, unspecified: Secondary | ICD-10-CM | POA: Diagnosis not present

## 2018-10-05 DIAGNOSIS — M25511 Pain in right shoulder: Secondary | ICD-10-CM | POA: Diagnosis not present

## 2018-10-05 DIAGNOSIS — M545 Low back pain: Secondary | ICD-10-CM | POA: Diagnosis not present

## 2018-10-05 DIAGNOSIS — M542 Cervicalgia: Secondary | ICD-10-CM | POA: Diagnosis not present

## 2018-10-07 DIAGNOSIS — M25511 Pain in right shoulder: Secondary | ICD-10-CM | POA: Diagnosis not present

## 2018-10-07 DIAGNOSIS — M542 Cervicalgia: Secondary | ICD-10-CM | POA: Diagnosis not present

## 2018-10-07 DIAGNOSIS — M545 Low back pain: Secondary | ICD-10-CM | POA: Diagnosis not present

## 2018-10-07 DIAGNOSIS — M799 Soft tissue disorder, unspecified: Secondary | ICD-10-CM | POA: Diagnosis not present

## 2018-10-14 DIAGNOSIS — M542 Cervicalgia: Secondary | ICD-10-CM | POA: Diagnosis not present

## 2018-10-14 DIAGNOSIS — M545 Low back pain: Secondary | ICD-10-CM | POA: Diagnosis not present

## 2018-10-14 DIAGNOSIS — M799 Soft tissue disorder, unspecified: Secondary | ICD-10-CM | POA: Diagnosis not present

## 2018-10-14 DIAGNOSIS — M25511 Pain in right shoulder: Secondary | ICD-10-CM | POA: Diagnosis not present

## 2018-10-19 DIAGNOSIS — M545 Low back pain: Secondary | ICD-10-CM | POA: Diagnosis not present

## 2018-10-19 DIAGNOSIS — M25511 Pain in right shoulder: Secondary | ICD-10-CM | POA: Diagnosis not present

## 2018-10-19 DIAGNOSIS — M542 Cervicalgia: Secondary | ICD-10-CM | POA: Diagnosis not present

## 2018-10-19 DIAGNOSIS — M799 Soft tissue disorder, unspecified: Secondary | ICD-10-CM | POA: Diagnosis not present

## 2018-10-21 DIAGNOSIS — M25511 Pain in right shoulder: Secondary | ICD-10-CM | POA: Diagnosis not present

## 2018-10-21 DIAGNOSIS — M799 Soft tissue disorder, unspecified: Secondary | ICD-10-CM | POA: Diagnosis not present

## 2018-10-21 DIAGNOSIS — M542 Cervicalgia: Secondary | ICD-10-CM | POA: Diagnosis not present

## 2018-10-21 DIAGNOSIS — M545 Low back pain: Secondary | ICD-10-CM | POA: Diagnosis not present

## 2018-11-03 DIAGNOSIS — F4310 Post-traumatic stress disorder, unspecified: Secondary | ICD-10-CM | POA: Diagnosis not present

## 2018-11-10 DIAGNOSIS — E7800 Pure hypercholesterolemia, unspecified: Secondary | ICD-10-CM | POA: Diagnosis not present

## 2018-11-10 DIAGNOSIS — F410 Panic disorder [episodic paroxysmal anxiety] without agoraphobia: Secondary | ICD-10-CM | POA: Diagnosis not present

## 2018-11-10 DIAGNOSIS — F419 Anxiety disorder, unspecified: Secondary | ICD-10-CM | POA: Diagnosis not present

## 2018-11-10 DIAGNOSIS — E1169 Type 2 diabetes mellitus with other specified complication: Secondary | ICD-10-CM | POA: Diagnosis not present

## 2018-11-10 DIAGNOSIS — F4310 Post-traumatic stress disorder, unspecified: Secondary | ICD-10-CM | POA: Diagnosis not present

## 2018-11-17 DIAGNOSIS — F4310 Post-traumatic stress disorder, unspecified: Secondary | ICD-10-CM | POA: Diagnosis not present

## 2018-12-01 DIAGNOSIS — F4310 Post-traumatic stress disorder, unspecified: Secondary | ICD-10-CM | POA: Diagnosis not present

## 2018-12-09 DIAGNOSIS — F419 Anxiety disorder, unspecified: Secondary | ICD-10-CM | POA: Diagnosis not present

## 2018-12-09 DIAGNOSIS — F410 Panic disorder [episodic paroxysmal anxiety] without agoraphobia: Secondary | ICD-10-CM | POA: Diagnosis not present

## 2018-12-09 DIAGNOSIS — E7800 Pure hypercholesterolemia, unspecified: Secondary | ICD-10-CM | POA: Diagnosis not present

## 2018-12-09 DIAGNOSIS — F430 Acute stress reaction: Secondary | ICD-10-CM | POA: Diagnosis not present

## 2018-12-15 DIAGNOSIS — F4310 Post-traumatic stress disorder, unspecified: Secondary | ICD-10-CM | POA: Diagnosis not present

## 2019-04-18 DIAGNOSIS — L732 Hidradenitis suppurativa: Secondary | ICD-10-CM | POA: Diagnosis not present

## 2019-04-18 DIAGNOSIS — L2084 Intrinsic (allergic) eczema: Secondary | ICD-10-CM | POA: Diagnosis not present

## 2019-04-18 DIAGNOSIS — L700 Acne vulgaris: Secondary | ICD-10-CM | POA: Diagnosis not present

## 2019-05-12 ENCOUNTER — Other Ambulatory Visit: Payer: Self-pay | Admitting: Physician Assistant

## 2019-05-12 DIAGNOSIS — E7800 Pure hypercholesterolemia, unspecified: Secondary | ICD-10-CM | POA: Diagnosis not present

## 2019-05-12 DIAGNOSIS — Z1231 Encounter for screening mammogram for malignant neoplasm of breast: Secondary | ICD-10-CM

## 2019-05-12 DIAGNOSIS — E1169 Type 2 diabetes mellitus with other specified complication: Secondary | ICD-10-CM | POA: Diagnosis not present

## 2019-05-12 DIAGNOSIS — F419 Anxiety disorder, unspecified: Secondary | ICD-10-CM | POA: Diagnosis not present

## 2019-05-12 DIAGNOSIS — Z0000 Encounter for general adult medical examination without abnormal findings: Secondary | ICD-10-CM | POA: Diagnosis not present

## 2019-06-16 ENCOUNTER — Ambulatory Visit
Admission: RE | Admit: 2019-06-16 | Discharge: 2019-06-16 | Disposition: A | Discharge status: Home / Self Care | Payer: Federal, State, Local not specified - PPO | Source: Ambulatory Visit | Attending: Physician Assistant | Admitting: Physician Assistant

## 2019-06-16 ENCOUNTER — Other Ambulatory Visit: Payer: Self-pay

## 2019-06-16 DIAGNOSIS — Z1231 Encounter for screening mammogram for malignant neoplasm of breast: Secondary | ICD-10-CM | POA: Diagnosis not present

## 2019-09-19 DIAGNOSIS — L732 Hidradenitis suppurativa: Secondary | ICD-10-CM | POA: Diagnosis not present

## 2019-09-19 DIAGNOSIS — L700 Acne vulgaris: Secondary | ICD-10-CM | POA: Diagnosis not present

## 2019-09-19 DIAGNOSIS — L2084 Intrinsic (allergic) eczema: Secondary | ICD-10-CM | POA: Diagnosis not present

## 2019-10-07 ENCOUNTER — Other Ambulatory Visit: Payer: Self-pay

## 2019-10-07 ENCOUNTER — Ambulatory Visit: Payer: Federal, State, Local not specified - PPO | Attending: Internal Medicine

## 2019-10-07 DIAGNOSIS — Z23 Encounter for immunization: Secondary | ICD-10-CM | POA: Insufficient documentation

## 2019-10-07 NOTE — Progress Notes (Signed)
   Covid-19 Vaccination Clinic  Name:  Aimee Hall    MRN: [id number] DOB: 02/25/1971  10/07/2019  [name] was observed post Covid-19 immunization for 15 minutes without incident. She was provided with Vaccine Information Sheet and instruction to access the V-Safe system.   [name] was instructed to call 911 with any severe reactions post vaccine: Marland Kitchen Difficulty breathing  . Swelling of face and throat  . A fast heartbeat  . A bad rash all over body  . Dizziness and weakness   Immunizations Administered    Name Date Dose VIS Date Route   Pfizer COVID-19 Vaccine 10/07/2019  9:41 AM 0.3 mL 07/15/2019 Intramuscular   Manufacturer: [employer], [employer]   Lot: [id number]   NDC: [address]-0

## 2019-10-29 ENCOUNTER — Ambulatory Visit: Payer: Federal, State, Local not specified - PPO | Attending: Internal Medicine

## 2019-10-29 DIAGNOSIS — Z23 Encounter for immunization: Secondary | ICD-10-CM

## 2019-10-29 NOTE — Progress Notes (Signed)
   Covid-19 Vaccination Clinic  Name:  Aimee Hall    MRN: [id number] DOB: 28-Aug-1970  10/29/2019  [name] was observed post Covid-19 immunization for 30 minutes based on pre-vaccination screening without incident. She was provided with Vaccine Information Sheet and instruction to access the V-Safe system.   [name] was instructed to call 911 with any severe reactions post vaccine: Marland Kitchen Difficulty breathing  . Swelling of face and throat  . A fast heartbeat  . A bad rash all over body  . Dizziness and weakness   Immunizations Administered    Name Date Dose VIS Date Route   Pfizer COVID-19 Vaccine 10/29/2019  3:07 PM 0.3 mL 07/15/2019 Intramuscular   Manufacturer: [employer], [employer]   Lot: [id number]   NDC: [address]-1

## 2019-11-02 ENCOUNTER — Ambulatory Visit: Payer: Federal, State, Local not specified - PPO

## 2019-11-25 IMAGING — MG DIGITAL SCREENING BILAT W/ TOMO W/ CAD
8 series · 8 of 24 positions shown · non-contrast
Comparison: Previous exam(s).

CLINICAL DATA: Screening.

EXAM:
DIGITAL SCREENING BILATERAL MAMMOGRAM WITH TOMO AND CAD

[R MLO synth-2D]
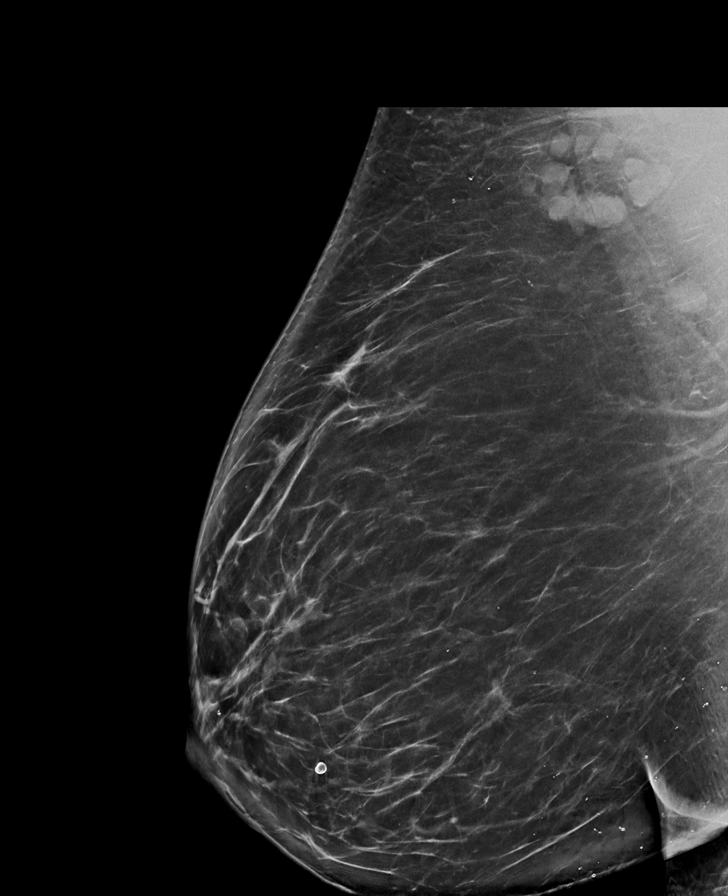

[R CC synth-2D]
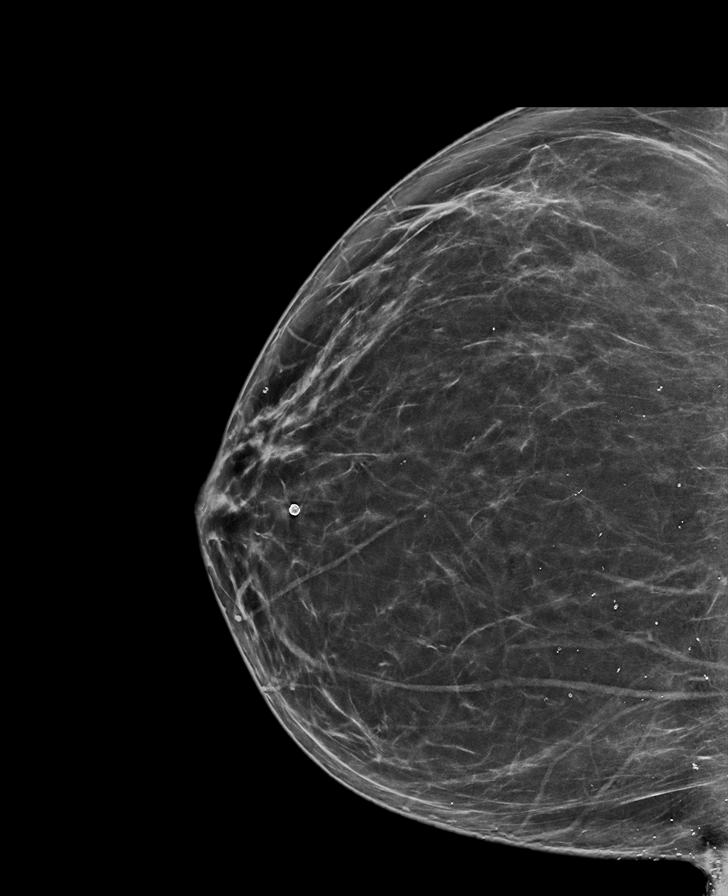

[L CC synth-2D]
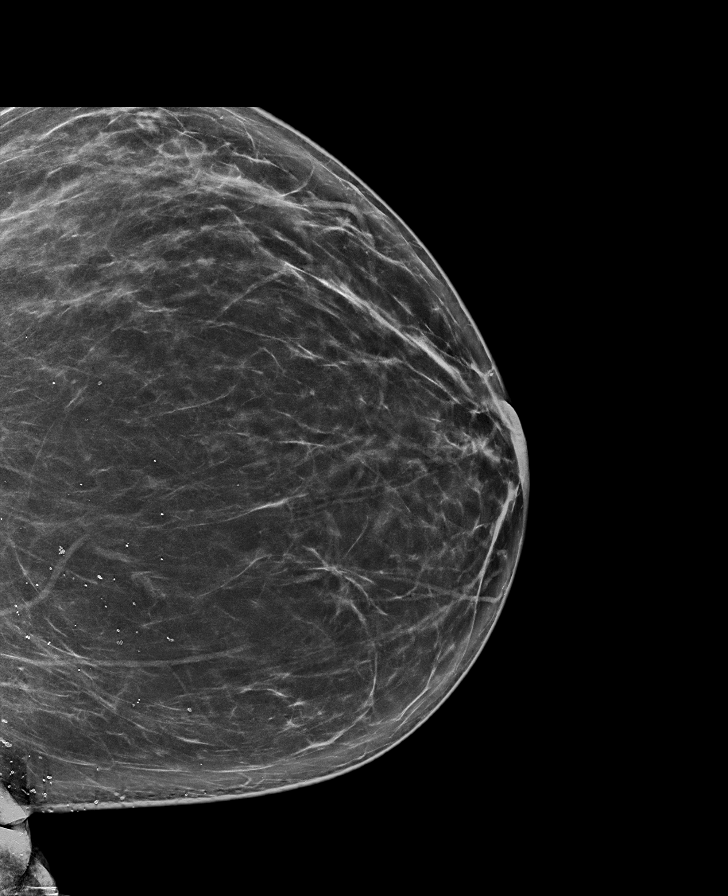

[L MLO synth-2D]
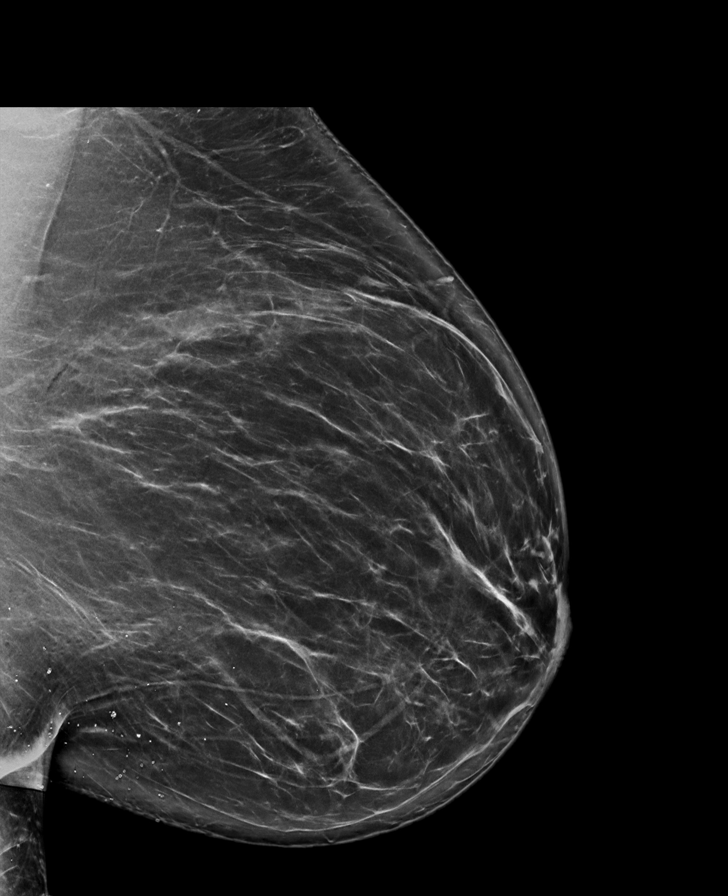

[R CC tomo · tomo slice 41/82.0]
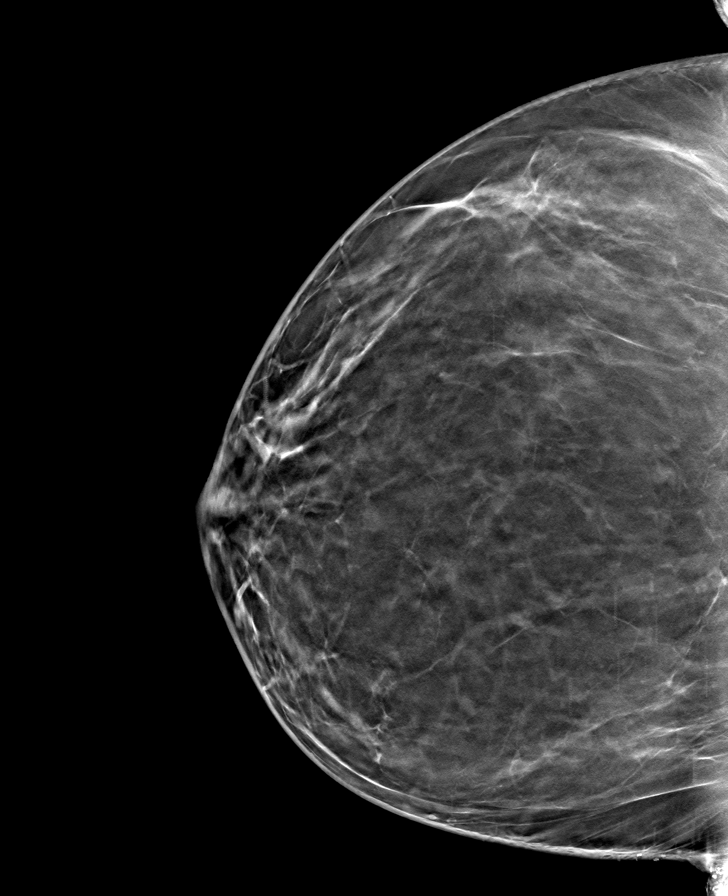

[R MLO tomo · tomo slice 53/106.0]
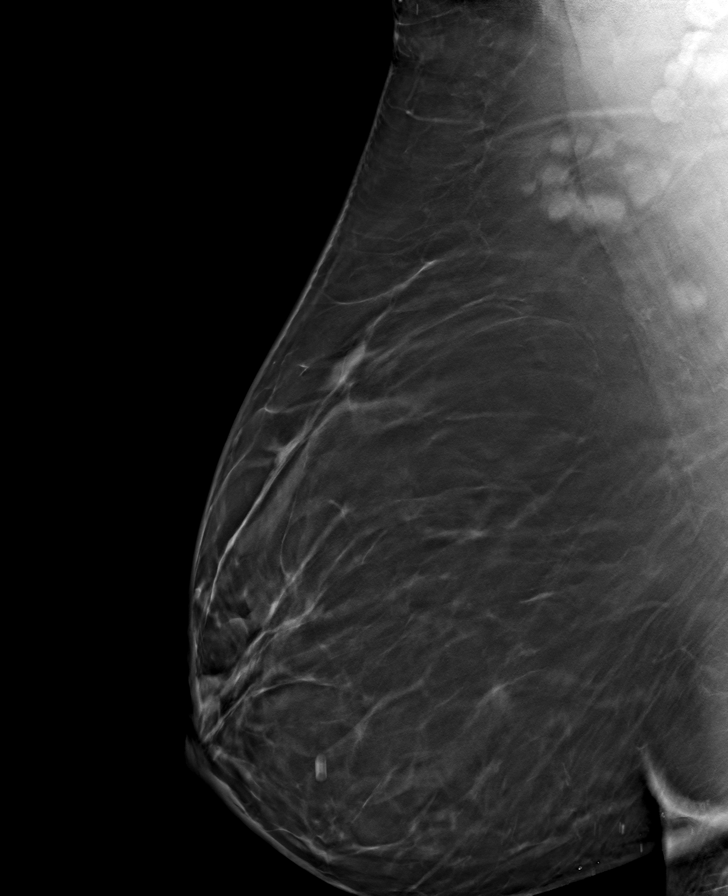

[L CC tomo · tomo slice 43/84.0]
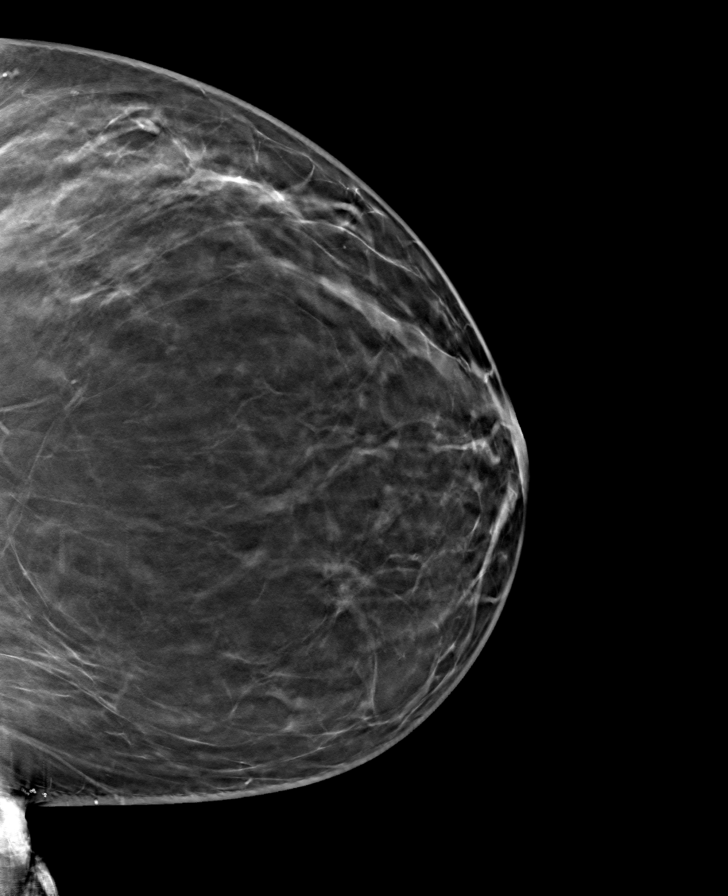

[L MLO tomo · tomo slice 49/96.0]
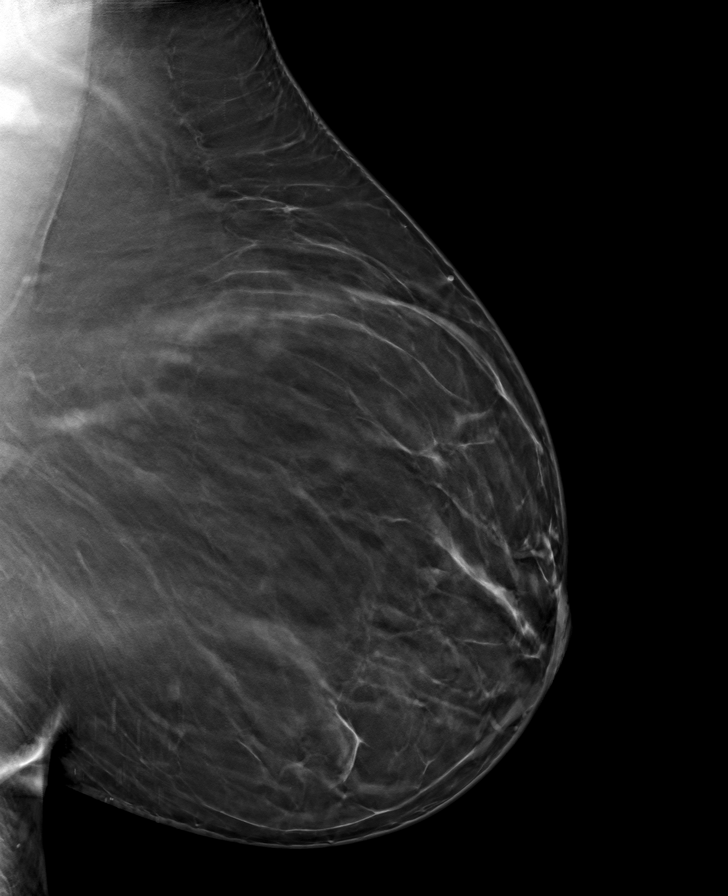

[8 of 24 positions shown; findings below may reference images not displayed]

ACR Breast Density Category b: There are scattered areas of
fibroglandular density.
FINDINGS: There are no findings suspicious for malignancy. Images were
processed with CAD.
IMPRESSION: No mammographic evidence of malignancy. A result letter of this
screening mammogram will be mailed directly to the patient.

RECOMMENDATION:
Screening mammogram in one year. (Code:[id number])

BI-RADS CATEGORY  1: Negative.

## 2020-02-07 DIAGNOSIS — E1169 Type 2 diabetes mellitus with other specified complication: Secondary | ICD-10-CM | POA: Diagnosis not present

## 2020-02-07 DIAGNOSIS — K219 Gastro-esophageal reflux disease without esophagitis: Secondary | ICD-10-CM | POA: Diagnosis not present

## 2020-02-07 DIAGNOSIS — Z0000 Encounter for general adult medical examination without abnormal findings: Secondary | ICD-10-CM | POA: Diagnosis not present

## 2020-02-07 DIAGNOSIS — R519 Headache, unspecified: Secondary | ICD-10-CM | POA: Diagnosis not present

## 2020-02-07 DIAGNOSIS — E559 Vitamin D deficiency, unspecified: Secondary | ICD-10-CM | POA: Diagnosis not present

## 2020-02-07 DIAGNOSIS — E7800 Pure hypercholesterolemia, unspecified: Secondary | ICD-10-CM | POA: Diagnosis not present

## 2020-03-19 DIAGNOSIS — R519 Headache, unspecified: Secondary | ICD-10-CM | POA: Diagnosis not present

## 2020-06-13 DIAGNOSIS — H5213 Myopia, bilateral: Secondary | ICD-10-CM | POA: Diagnosis not present

## 2020-06-13 DIAGNOSIS — H33312 Horseshoe tear of retina without detachment, left eye: Secondary | ICD-10-CM | POA: Diagnosis not present

## 2020-06-13 DIAGNOSIS — H524 Presbyopia: Secondary | ICD-10-CM | POA: Diagnosis not present

## 2020-06-13 DIAGNOSIS — H52223 Regular astigmatism, bilateral: Secondary | ICD-10-CM | POA: Diagnosis not present

## 2020-06-13 DIAGNOSIS — E119 Type 2 diabetes mellitus without complications: Secondary | ICD-10-CM | POA: Diagnosis not present

## 2020-06-13 DIAGNOSIS — H35413 Lattice degeneration of retina, bilateral: Secondary | ICD-10-CM | POA: Diagnosis not present

## 2020-07-09 DIAGNOSIS — Z1211 Encounter for screening for malignant neoplasm of colon: Secondary | ICD-10-CM | POA: Diagnosis not present

## 2020-07-09 DIAGNOSIS — Z01419 Encounter for gynecological examination (general) (routine) without abnormal findings: Secondary | ICD-10-CM | POA: Diagnosis not present

## 2020-07-09 DIAGNOSIS — Z124 Encounter for screening for malignant neoplasm of cervix: Secondary | ICD-10-CM | POA: Diagnosis not present

## 2020-07-09 DIAGNOSIS — Z1239 Encounter for other screening for malignant neoplasm of breast: Secondary | ICD-10-CM | POA: Diagnosis not present

## 2020-08-02 DIAGNOSIS — R062 Wheezing: Secondary | ICD-10-CM | POA: Diagnosis not present

## 2020-08-02 DIAGNOSIS — R059 Cough, unspecified: Secondary | ICD-10-CM | POA: Diagnosis not present

## 2020-08-07 DIAGNOSIS — E7800 Pure hypercholesterolemia, unspecified: Secondary | ICD-10-CM | POA: Diagnosis not present

## 2020-08-07 DIAGNOSIS — F419 Anxiety disorder, unspecified: Secondary | ICD-10-CM | POA: Diagnosis not present

## 2020-08-07 DIAGNOSIS — K219 Gastro-esophageal reflux disease without esophagitis: Secondary | ICD-10-CM | POA: Diagnosis not present

## 2020-08-07 DIAGNOSIS — E1169 Type 2 diabetes mellitus with other specified complication: Secondary | ICD-10-CM | POA: Diagnosis not present

## 2020-08-09 ENCOUNTER — Telehealth: Payer: Self-pay

## 2020-08-09 NOTE — Telephone Encounter (Signed)
NOTES ON FILE FROM [facility] [phone], SENT REFERRAL TO SCHEDULING 

## 2020-11-02 ENCOUNTER — Other Ambulatory Visit: Payer: Self-pay

## 2020-11-02 ENCOUNTER — Ambulatory Visit: Payer: Federal, State, Local not specified - PPO | Admitting: Internal Medicine

## 2020-11-02 ENCOUNTER — Encounter: Payer: Self-pay | Admitting: Internal Medicine

## 2020-11-02 VITALS — BP 121/85 | HR 79 | Ht 71.0 in | Wt 307.0 lb

## 2020-11-02 DIAGNOSIS — E119 Type 2 diabetes mellitus without complications: Secondary | ICD-10-CM

## 2020-11-02 DIAGNOSIS — E7800 Pure hypercholesterolemia, unspecified: Secondary | ICD-10-CM

## 2020-11-02 DIAGNOSIS — E785 Hyperlipidemia, unspecified: Secondary | ICD-10-CM

## 2020-11-02 NOTE — Patient Instructions (Signed)
Medication Instructions:  Your physician recommends that you continue on your current medications as directed. Please refer to the Current Medication list given to you today.  ** any changes will depend on calcium score  *If you need a refill on your cardiac medications before your next appointment, please call your pharmacy*   Lab Work: FASTING lab work in about 4 months to check cholesterol   If you have labs (blood work) drawn today and your tests are completely normal, you will receive your results only by: Marland Kitchen MyChart Message (if you have MyChart) OR . A paper copy in the mail If you have any lab test that is abnormal or we need to change your treatment, we will call you to review the results.   Testing/Procedures: Dr. Rennis Golden has ordered a CT coronary calcium score. This test is done at [address]. This is $99 out of pocket.   Coronary CalciumScan A coronary calcium scan is an imaging test used to look for deposits of calcium and other fatty materials (plaques) in the inner lining of the blood vessels of the heart (coronary arteries). These deposits of calcium and plaques can partly clog and narrow the coronary arteries without producing any symptoms or warning signs. This puts a person at risk for a heart attack. This test can detect these deposits before symptoms develop. Tell a health care provider about:  Any allergies you have.  All medicines you are taking, including vitamins, herbs, eye drops, creams, and over-the-counter medicines.  Any problems you or family members have had with anesthetic medicines.  Any blood disorders you have.  Any surgeries you have had.  Any medical conditions you have.  Whether you are pregnant or may be pregnant. What are the risks? Generally, this is a safe procedure. However, problems may occur, including:  Harm to a pregnant woman and her unborn baby. This test involves the use of radiation. Radiation exposure can  be dangerous to a pregnant woman and her unborn baby. If you are pregnant, you generally should not have this procedure done.  Slight increase in the risk of cancer. This is because of the radiation involved in the test. What happens before the procedure? No preparation is needed for this procedure. What happens during the procedure?  You will undress and remove any jewelry around your neck or chest.  You will put on a hospital gown.  Sticky electrodes will be placed on your chest. The electrodes will be connected to an electrocardiogram (ECG) machine to record a tracing of the electrical activity of your heart.  A CT scanner will take pictures of your heart. During this time, you will be asked to lie still and hold your breath for 2-3 seconds while a picture of your heart is being taken. The procedure may vary among health care providers and hospitals. What happens after the procedure?  You can get dressed.  You can return to your normal activities.  It is up to you to get the results of your test. Ask your health care provider, or the department that is doing the test, when your results will be ready. Summary  A coronary calcium scan is an imaging test used to look for deposits of calcium and other fatty materials (plaques) in the inner lining of the blood vessels of the heart (coronary arteries).  Generally, this is a safe procedure. Tell your health care provider if you are pregnant or may be pregnant.  No preparation is needed  for this procedure.  A CT scanner will take pictures of your heart.  You can return to your normal activities after the scan is done. This information is not intended to replace advice given to you by your health care provider. Make sure you discuss any questions you have with your health care provider. Document Released: 01/17/2008 Document Revised: 06/09/2016 Document Reviewed: 06/09/2016 Elsevier Interactive Patient Education  2017 [employer].     Follow-Up: At [facility], you and your health needs are our priority.  As part of our continuing mission to provide you with exceptional heart care, we have created designated Provider Care Teams.  These Care Teams include your primary Cardiologist (physician) and Advanced Practice Providers (APPs -  Physician Assistants and Nurse Practitioners) who all work together to provide you with the care you need, when you need it.  We recommend signing up for the patient portal called "MyChart".  Sign up information is provided on this After Visit Summary.  MyChart is used to connect with patients for Virtual Visits (Telemedicine).  Patients are able to view lab/test results, encounter notes, upcoming appointments, etc.  Non-urgent messages can be sent to your provider as well.   To learn more about what you can do with MyChart, go to [email or web address].    Your next appointment:   4 month(s) - lipid clinic  The format for your next appointment:   In Person  Provider:   K. Italy Hilty, MD   Other Instructions

## 2020-11-04 ENCOUNTER — Encounter: Payer: Self-pay | Admitting: Internal Medicine

## 2020-11-04 NOTE — Progress Notes (Signed)
LIPID CLINIC CONSULT NOTE  Chief Complaint:  Manage dyslipidemia  Primary Care Physician: Milus Height, PA  Primary Cardiologist:  No primary care provider on file.  HPI:  Aimee Hall is a 50 y.o. female who is being seen today for the evaluation of dyslipidemia at the request of Redmon, [place], [place].  This is a pleasant 50 year old female who is kindly referred for evaluation and management of dyslipidemia.  Past medical history is significant for diabetes, reflux, multiple episodes of what sounds like vasovagal syncope and dehydration as well as asthma.  She also has a history of elevated cholesterol but has been hesitant to take statins due to a concerns about vitamin deficiencies associated with that.  Most recently her lipids show total cholesterol 257, triglycerides 141, HDL 34 and LDL of 197.  Hemoglobin A1c was 6.9.  Her PCP had recommended rosuvastatin however she had not had that filled and previously did try atorvastatin but did have side effects associated with it which were muscle spasms across her left upper chest.  She says that recently she has started up on a low carbohydrate diet but not a ketogenic diet and is losing some weight with this.  She is preferring to try to manage her lipids with dietary modification if possible.  PMHx:  Past Medical History:  Diagnosis Date  . Asthma   . Dehydration   . Diabetes mellitus without complication (HCC)   . GERD (gastroesophageal reflux disease)   . Syncopal episodes     No past surgical history on file.  FAMHx:  Family History  Problem Relation Age of Onset  . Syncope episode Other     SOCHx:   reports that she has never smoked. She has never used smokeless tobacco. She reports that she does not drink alcohol and does not use drugs.  ALLERGIES:  Allergies  Allergen Reactions  . Dilaudid [Hydromorphone]   . Iodinated Diagnostic Agents Hives  . Shellfish Allergy     ROS: Pertinent items noted in HPI and  remainder of comprehensive ROS otherwise negative.  HOME MEDS: Current Outpatient Medications on File Prior to Visit  Medication Sig Dispense Refill  . OMEPRAZOLE PO Take by mouth.     No current facility-administered medications on file prior to visit.    LABS/IMAGING: No results found for this or any previous visit (from the past 48 hour(s)). No results found.  LIPID PANEL: No results found for: CHOL, TRIG, HDL, CHOLHDL, VLDL, LDLCALC, LDLDIRECT  WEIGHTS: Wt Readings from Last 3 Encounters:  11/02/20 (!) 307 lb (139.3 kg)  12/19/16 (!) 313 lb (142 kg)  08/18/15 (!) 320 lb (145.2 kg)    VITALS: BP 121/85   Pulse 79   Ht 5\' 11"  (1.803 m)   Wt (!) 307 lb (139.3 kg)   SpO2 100%   BMI 42.82 kg/m   EXAM: General appearance: alert, no distress and morbidly obese Neck: no carotid bruit, no JVD and thyroid not enlarged, symmetric, no tenderness/mass/nodules Lungs: clear to auscultation bilaterally Heart: regular rate and rhythm Abdomen: soft, non-tender; bowel sounds normal; no masses,  no organomegaly Extremities: extremities normal, atraumatic, no cyanosis or edema Pulses: 2+ and symmetric Skin: Skin color, texture, turgor normal. No rashes or lesions Neurologic: Grossly normal Psych: Pleasant  *Examination chaperoned by , RN  EKG: Deferred  ASSESSMENT: 1. 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 Atorvastatin intolerant 3. History of vasovagal syncope 4. Type 2 diabetes 5. Morbid obesity with weight loss efforts  in place  PLAN: 1.   [name] has a mixed dyslipidemia with a high LDL cholesterol.  It is not clear that there is a family history of early onset heart disease or extremely high cholesterol seen with FH although her LDL is quite high.  There may be a strong dietary component to this that she notes that her family eats country style meals including fried foods and higher saturated fats.  She is making an effort to reduce her  carbohydrates which she says works very well for her and recently she has lost some significant weight.  We discussed the possibility of assessment for coronary artery disease with calcium scoring and she seemed agreeable to this.  Hopefully this could better wrist stratify her and we could make a more informed decision about whether to proceed with diet alone or pharmacotherapy.  We will contact her with those results and plan follow-up afterwards.  Thanks again for the kind referral.  Chrystie Nose, MD, [facility]  [place]  [facility]  Medical Director of the Advanced Lipid Disorders &  Cardiovascular Risk Reduction Clinic Diplomate of the American Board of Clinical Lipidology Attending Cardiologist  Direct Dial: [phone]  Fax: [phone]  Website:  [email or web address] 11/04/2020, 3:42 PM

## 2020-12-03 ENCOUNTER — Other Ambulatory Visit: Payer: Self-pay

## 2020-12-03 ENCOUNTER — Ambulatory Visit (INDEPENDENT_AMBULATORY_CARE_PROVIDER_SITE_OTHER)
Admission: RE | Admit: 2020-12-03 | Discharge: 2020-12-03 | Disposition: A | Discharge status: Home / Self Care | Payer: Self-pay | Source: Ambulatory Visit | Attending: Internal Medicine | Admitting: Internal Medicine

## 2020-12-03 DIAGNOSIS — E7800 Pure hypercholesterolemia, unspecified: Secondary | ICD-10-CM

## 2020-12-25 DIAGNOSIS — R519 Headache, unspecified: Secondary | ICD-10-CM | POA: Diagnosis not present

## 2021-02-08 DIAGNOSIS — E7800 Pure hypercholesterolemia, unspecified: Secondary | ICD-10-CM | POA: Diagnosis not present

## 2021-02-08 DIAGNOSIS — F419 Anxiety disorder, unspecified: Secondary | ICD-10-CM | POA: Diagnosis not present

## 2021-02-08 DIAGNOSIS — K219 Gastro-esophageal reflux disease without esophagitis: Secondary | ICD-10-CM | POA: Diagnosis not present

## 2021-02-08 DIAGNOSIS — E1169 Type 2 diabetes mellitus with other specified complication: Secondary | ICD-10-CM | POA: Diagnosis not present

## 2021-02-08 DIAGNOSIS — E559 Vitamin D deficiency, unspecified: Secondary | ICD-10-CM | POA: Diagnosis not present

## 2021-02-08 DIAGNOSIS — Z0000 Encounter for general adult medical examination without abnormal findings: Secondary | ICD-10-CM | POA: Diagnosis not present

## 2021-02-11 DIAGNOSIS — L732 Hidradenitis suppurativa: Secondary | ICD-10-CM | POA: Diagnosis not present

## 2021-02-11 DIAGNOSIS — L720 Epidermal cyst: Secondary | ICD-10-CM | POA: Diagnosis not present

## 2021-02-11 DIAGNOSIS — L089 Local infection of the skin and subcutaneous tissue, unspecified: Secondary | ICD-10-CM | POA: Diagnosis not present

## 2021-02-11 DIAGNOSIS — L700 Acne vulgaris: Secondary | ICD-10-CM | POA: Diagnosis not present

## 2021-03-11 ENCOUNTER — Telehealth: Payer: Self-pay | Admitting: Internal Medicine

## 2021-03-11 NOTE — Telephone Encounter (Signed)
Patient called and said she had some labs done at her PCP recently. Her PCP is part of [facility]. The patient wanted to know if Dr. Rennis Golden got those results faxed to him, and if she needs to do any other labs before her upcoming appointment. Please advise

## 2021-03-11 NOTE — Telephone Encounter (Signed)
Spoke with Aimee Hall (PCP) office. NMR lipoprofile was not completed, but regular lipid panel was done about 1 month ago. They will fax results  Sent to MD to advise if this is OK, or does she need to come in for the NMR?

## 2021-03-11 NOTE — Telephone Encounter (Signed)
Returned call to patient, advised patient I would forward message to Dr. Blanchie Dessert nurse to see if labs are in Dr. Blanchie Dessert basket. Patient states she is not sure if [facility] did the NMR, Lipoprofile or not and if it is what she needs for her upcoming appointment- and if they didn't she stated she would like to know if she needs to come in to get it done. Advised that we would let her know. Patient verbalized understanding.

## 2021-03-12 DIAGNOSIS — E785 Hyperlipidemia, unspecified: Secondary | ICD-10-CM | POA: Diagnosis not present

## 2021-03-12 NOTE — Telephone Encounter (Signed)
I am interested in the NMR  since her LDL was high, but calcium score was zero - want to look more into her particles.  Dr Rexene Edison

## 2021-03-12 NOTE — Telephone Encounter (Signed)
Spoke with patient and advised MD would like her to have NMR. She will have this completed today

## 2021-03-13 LAB — NMR, LIPOPROFILE
Cholesterol, Total: 245 mg/dL — ABNORMAL HIGH (ref 100–199)
HDL Particle Number: 24.9 umol/L — ABNORMAL LOW (ref >=30.5)
HDL-C: 40 mg/dL (ref >39)
LDL Particle Number: 2430 nmol/L — ABNORMAL HIGH (ref <1000)
LDL Size: 20.7 nm (ref >20.5)
LDL-C (NIH Calc): 193 mg/dL — ABNORMAL HIGH (ref 0–99)
LP-IR Score: 64 — ABNORMAL HIGH (ref <=45)
Small LDL Particle Number: 1127 nmol/L — ABNORMAL HIGH (ref <=527)
Triglycerides: 70 mg/dL (ref 0–149)

## 2021-03-19 ENCOUNTER — Encounter: Payer: Self-pay | Admitting: Internal Medicine

## 2021-03-19 ENCOUNTER — Ambulatory Visit (INDEPENDENT_AMBULATORY_CARE_PROVIDER_SITE_OTHER): Payer: Federal, State, Local not specified - PPO | Admitting: Internal Medicine

## 2021-03-19 VITALS — BP 122/90 | HR 80 | Ht 71.0 in | Wt 297.0 lb

## 2021-03-19 DIAGNOSIS — T466X5A Adverse effect of antihyperlipidemic and antiarteriosclerotic drugs, initial encounter: Secondary | ICD-10-CM

## 2021-03-19 DIAGNOSIS — M791 Myalgia, unspecified site: Secondary | ICD-10-CM | POA: Diagnosis not present

## 2021-03-19 DIAGNOSIS — E7849 Other hyperlipidemia: Secondary | ICD-10-CM | POA: Diagnosis not present

## 2021-03-19 DIAGNOSIS — E119 Type 2 diabetes mellitus without complications: Secondary | ICD-10-CM | POA: Diagnosis not present

## 2021-03-19 NOTE — Patient Instructions (Signed)
Medication Instructions:   REPATHA OR PRAULRANT   *If you need a refill on your cardiac medications before your next appointment, please call your pharmacy*   Lab Work:  Your physician recommends that you return for lab work in: 4 MONTHS -FASTING-PRIOR TO FOLLOW UP APPOINTMENT   If you have labs (blood work) drawn today and your tests are completely normal, you will receive your results only by: MyChart Message (if you have MyChart) OR A paper copy in the mail If you have any lab test that is abnormal or we need to change your treatment, we will call you to review the results.   Follow-Up: At [facility], you and your health needs are our priority.  As part of our continuing mission to provide you with exceptional heart care, we have created designated Provider Care Teams.  These Care Teams include your primary Cardiologist (physician) and Advanced Practice Providers (APPs -  Physician Assistants and Nurse Practitioners) who all work together to provide you with the care you need, when you need it.  We recommend signing up for the patient portal called "MyChart".  Sign up information is provided on this After Visit Summary.  MyChart is used to connect with patients for Virtual Visits (Telemedicine).  Patients are able to view lab/test results, encounter notes, upcoming appointments, etc.  Non-urgent messages can be sent to your provider as well.   To learn more about what you can do with MyChart, go to [email or web address].    Your next appointment:   4 month(s)  The format for your next appointment:   In Person  Provider:   K. Italy Hilty, MD

## 2021-03-19 NOTE — Progress Notes (Addendum)
LIPID CLINIC CONSULT NOTE  Chief Complaint:  Manage dyslipidemia  Primary Care Physician: Milus Height, PA  Primary Cardiologist:  None  HPI:  Aimee Hall is a 50 y.o. female who is being seen today for the evaluation of dyslipidemia at the request of Redmon, [place], [place].  This is a pleasant 50 year old female who is kindly referred for evaluation and management of dyslipidemia.  Past medical history is significant for diabetes, reflux, multiple episodes of what sounds like vasovagal syncope and dehydration as well as asthma.  She also has a history of elevated cholesterol but has been hesitant to take statins due to a concerns about vitamin deficiencies associated with that.  Most recently her lipids show total cholesterol 257, triglycerides 141, HDL 34 and LDL of 197.  Hemoglobin A1c was 6.9.  Her PCP had recommended rosuvastatin however she had not had that filled and previously did try atorvastatin but did have side effects associated with it which were muscle spasms across her left upper chest.  She says that recently she has started up on a low carbohydrate diet but not a ketogenic diet and is losing some weight with this.  She is preferring to try to manage her lipids with dietary modification if possible.  03/19/2021  Aimee Hall returns today for follow-up.  She underwent calcium scoring which is reassuring and 0.  However given her younger age she may not yet have calcified her coronaries.  She does have diabetes on metformin however A1c is 6.0.  She is working on weight loss.  Her lipids continue to be elevated.  Total cholesterol 229, triglycerides of 70, HDL 40 and LDL 193.  Her LDL particle numbers remain high at 2430.  All LDL P is also high at 1127.  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  PMHx:  Past Medical History:  Diagnosis Date   Asthma    Dehydration    Diabetes mellitus without complication (HCC)    GERD  (gastroesophageal reflux disease)    Syncopal episodes     No past surgical history on file.  FAMHx:  Family History  Problem Relation Age of Onset   Syncope episode Other     SOCHx:   reports that she has never smoked. She has never used smokeless tobacco. She reports that she does not drink alcohol and does not use drugs.  ALLERGIES:  Allergies  Allergen Reactions   Atorvastatin Other (See Comments)    Muscle cramps   Dilaudid [Hydromorphone]    Iodinated Diagnostic Agents Hives   Shellfish Allergy     ROS: Pertinent items noted in HPI and remainder of comprehensive ROS otherwise negative.  HOME MEDS: Current Outpatient Medications on File Prior to Visit  Medication Sig Dispense Refill   metFORMIN (GLUCOPHAGE) 500 MG tablet Take 250 mg by mouth 2 (two) times daily.     OMEPRAZOLE PO Take by mouth.     No current facility-administered medications on file prior to visit.    LABS/IMAGING: No results found for this or any previous visit (from the past 48 hour(s)). No results found.  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   Aimee Hall has a persistently elevated lipid profile with LDL greater than 190 and high LDL-P and small LDL-P.  Overall increased risk cardiovascularly.  Although she had no coronary calcium, and her Matto age may preclude calcification at this point.  I would suggest  aggressive therapy.  Unfortunately she could not tolerate atorvastatin due to side effects.  I feel she is a good candidate for PCSK9 inhibitor -despite dietary changes her cholesterol has not come down which is more suggestive of familial hyperlipidemia.  Would recommend a 50% or greater reduction based on current guidelines for LDL greater than 190 and she is likely to achieve that on a PCSK9 inhibitor.  We will reach out for prior authorization plan repeat lipids and an LP(a) in about 3 to 4 months and follow-up at that time.  Chrystie Nose, MD, [facility], FACP  [place]  [facility]  Medical Director of the Advanced Lipid Disorders &  Cardiovascular Risk Reduction Clinic Diplomate of the American Board of Clinical Lipidology Attending Cardiologist  Direct Dial: [phone]  Fax: [phone]  Website:  [email or web address] 03/19/2021, 9:03 AM

## 2021-03-21 ENCOUNTER — Telehealth: Payer: Self-pay | Admitting: Internal Medicine

## 2021-03-21 NOTE — Telephone Encounter (Signed)
PA for Repatha Sureclick to be submitted via CMM Patient has [employer] FEP  Need update on DUTCH score from MD for submission

## 2021-03-21 NOTE — Telephone Encounter (Signed)
Done - but it is only 4 (possible FH), so she may get denied.  Dr Rexene Edison

## 2021-03-25 NOTE — Telephone Encounter (Signed)
PA for Repatha Sureclick submitted via CMM (Key: [id number])

## 2021-03-28 NOTE — Telephone Encounter (Signed)
Patient notified that she has not been approved  Could appeal denial OR so genetic test and see if she has FH, then could submit PA with that diagnosis

## 2021-03-28 NOTE — Telephone Encounter (Signed)
Patient denied coverage of Repatha. Per letter received:  The indicated use of this medication, as provided does not meet the BCBS benefit plan's criteria due to the following reasong:  The use of this medication without the patient being at high risk for ASCVD or other CV event based on 10 year risk score determined by either a ASCVD pooled cohort risk assessment or Fr[place]ham risk score or without a history of ASCVD or one CV event does not establish medical necessity for this drug.   The member can file a disputed claim. The member would need to write a letter or sign an authorized representative form  Fax to:  BCBS-FEP Service Benefit Plan [phone]

## 2021-04-02 ENCOUNTER — Other Ambulatory Visit: Payer: Self-pay

## 2021-04-02 ENCOUNTER — Ambulatory Visit: Payer: Federal, State, Local not specified - PPO | Admitting: *Deleted

## 2021-04-02 DIAGNOSIS — E7800 Pure hypercholesterolemia, unspecified: Secondary | ICD-10-CM

## 2021-04-02 NOTE — Progress Notes (Signed)
Patient of Dr. Rennis Golden who presents today for genetic test for FH  Buccal swab performed - mailed  Informed consent provided to patient  Advised results should be available in 3-4 weeks

## 2021-04-08 NOTE — Telephone Encounter (Signed)
Appeals letter composed - will be faxed with MD note, denial letter, patient letter to [phone]

## 2021-04-09 NOTE — Telephone Encounter (Signed)
Appeals packet faxed to [facility] FEP

## 2021-04-19 ENCOUNTER — Encounter (HOSPITAL_BASED_OUTPATIENT_CLINIC_OR_DEPARTMENT_OTHER): Payer: Self-pay

## 2021-04-19 MED ORDER — REPATHA SURECLICK 140 MG/ML ~~LOC~~ SOAJ: 1.0000 | SUBCUTANEOUS | 11 refills | Status: DC

## 2021-04-19 NOTE — Addendum Note (Signed)
Addended by: Lindell Spar on: 04/19/2021 02:09 PM   Modules accepted: Orders

## 2021-04-19 NOTE — Telephone Encounter (Signed)
Patient approved for Repatha 140mg /mL per [employer]BS FEP from 03/19/21 - 04/18/22  Notified patient via MyChart

## 2021-07-30 ENCOUNTER — Telehealth: Payer: Self-pay

## 2021-07-30 DIAGNOSIS — E7849 Other hyperlipidemia: Secondary | ICD-10-CM

## 2021-07-30 DIAGNOSIS — E7800 Pure hypercholesterolemia, unspecified: Secondary | ICD-10-CM | POA: Diagnosis not present

## 2021-07-30 DIAGNOSIS — E119 Type 2 diabetes mellitus without complications: Secondary | ICD-10-CM

## 2021-07-30 DIAGNOSIS — Z79899 Other long term (current) drug therapy: Secondary | ICD-10-CM

## 2021-07-30 NOTE — Telephone Encounter (Signed)
UNABLE TO DRAW PER [facility] TECH WILL RETURN LATER THIS WEEK

## 2021-07-31 DIAGNOSIS — E119 Type 2 diabetes mellitus without complications: Secondary | ICD-10-CM | POA: Diagnosis not present

## 2021-07-31 DIAGNOSIS — E7800 Pure hypercholesterolemia, unspecified: Secondary | ICD-10-CM | POA: Diagnosis not present

## 2021-07-31 DIAGNOSIS — Z79899 Other long term (current) drug therapy: Secondary | ICD-10-CM | POA: Diagnosis not present

## 2021-07-31 DIAGNOSIS — E7849 Other hyperlipidemia: Secondary | ICD-10-CM | POA: Diagnosis not present

## 2021-07-31 LAB — LIPOPROTEIN A (LPA): Lipoprotein (a): 73.8 nmol/L (ref <75.0)

## 2021-08-01 LAB — NMR, LIPOPROFILE
Cholesterol, Total: 175 mg/dL (ref 100–199)
HDL Particle Number: 29.7 umol/L — ABNORMAL LOW (ref >=30.5)
HDL-C: 41 mg/dL (ref >39)
LDL Particle Number: 1327 nmol/L — ABNORMAL HIGH (ref <1000)
LDL Size: 20.6 nm (ref >20.5)
LDL-C (NIH Calc): 116 mg/dL — ABNORMAL HIGH (ref 0–99)
LP-IR Score: 67 — ABNORMAL HIGH (ref <=45)
Small LDL Particle Number: 660 nmol/L — ABNORMAL HIGH (ref <=527)
Triglycerides: 98 mg/dL (ref 0–149)

## 2021-08-07 ENCOUNTER — Encounter (HOSPITAL_BASED_OUTPATIENT_CLINIC_OR_DEPARTMENT_OTHER): Payer: Self-pay | Admitting: Internal Medicine

## 2021-08-07 ENCOUNTER — Ambulatory Visit (HOSPITAL_BASED_OUTPATIENT_CLINIC_OR_DEPARTMENT_OTHER): Payer: Federal, State, Local not specified - PPO | Admitting: Internal Medicine

## 2021-08-07 ENCOUNTER — Other Ambulatory Visit: Payer: Self-pay

## 2021-08-07 VITALS — BP 128/78 | HR 78 | Ht 70.05 in | Wt 302.6 lb

## 2021-08-07 DIAGNOSIS — M791 Myalgia, unspecified site: Secondary | ICD-10-CM

## 2021-08-07 DIAGNOSIS — E7849 Other hyperlipidemia: Secondary | ICD-10-CM

## 2021-08-07 DIAGNOSIS — T466X5D Adverse effect of antihyperlipidemic and antiarteriosclerotic drugs, subsequent encounter: Secondary | ICD-10-CM

## 2021-08-07 DIAGNOSIS — E119 Type 2 diabetes mellitus without complications: Secondary | ICD-10-CM

## 2021-08-07 DIAGNOSIS — E7800 Pure hypercholesterolemia, unspecified: Secondary | ICD-10-CM

## 2021-08-07 NOTE — Patient Instructions (Signed)
Medication Instructions:  Your physician recommends that you continue on your current medications as directed. Please refer to the Current Medication list given to you today.  *If you need a refill on your cardiac medications before your next appointment, please call your pharmacy*   Lab Work: FASTING NMR lipoprofile in 6 months to check cholesterol  -- complete about 1 week before your next visit with Dr. Rennis Golden   If you have labs (blood work) drawn today and your tests are completely normal, you will receive your results only by: MyChart Message (if you have MyChart) OR A paper copy in the mail If you have any lab test that is abnormal or we need to change your treatment, we will call you to review the results.   Follow-Up: At [facility], you and your health needs are our priority.  As part of our continuing mission to provide you with exceptional heart care, we have created designated Provider Care Teams.  These Care Teams include your primary Cardiologist (physician) and Advanced Practice Providers (APPs -  Physician Assistants and Nurse Practitioners) who all work together to provide you with the care you need, when you need it.  We recommend signing up for the patient portal called "MyChart".  Sign up information is provided on this After Visit Summary.  MyChart is used to connect with patients for Virtual Visits (Telemedicine).  Patients are able to view lab/test results, encounter notes, upcoming appointments, etc.  Non-urgent messages can be sent to your provider as well.   To learn more about what you can do with MyChart, go to [email or web address].    Your next appointment:   6 month(s)  The format for your next appointment:   In Person  Provider:   Dr. Zoila Shutter - lipid clinic

## 2021-08-07 NOTE — Progress Notes (Signed)
LIPID CLINIC CONSULT NOTE  Chief Complaint:  Follow-up dyslipidemia  Primary Care Physician: Lennie Odor, PA  Primary Cardiologist:  None  HPI:  Aimee Hall is a 51 y.o. female who is being seen today for the evaluation of dyslipidemia at the request of Redmon, [place], [place].  This is a pleasant 51 year old female who is kindly referred for evaluation and management of dyslipidemia.  Past medical history is significant for diabetes, reflux, multiple episodes of what sounds like vasovagal syncope and dehydration as well as asthma.  She also has a history of elevated cholesterol but has been hesitant to take statins due to a concerns about vitamin deficiencies associated with that.  Most recently her lipids show total cholesterol 257, triglycerides 141, HDL 34 and LDL of 197.  Hemoglobin A1c was 6.9.  Her PCP had recommended rosuvastatin however she had not had that filled and previously did try atorvastatin but did have side effects associated with it which were muscle spasms across her left upper chest.  She says that recently she has started up on a low carbohydrate diet but not a ketogenic diet and is losing some weight with this.  She is preferring to try to manage her lipids with dietary modification if possible.  03/19/2021  Aimee Hall returns today for follow-up.  She underwent calcium scoring which is reassuring and 0.  However given her younger age she may not yet have calcified her coronaries.  She does have diabetes on metformin however A1c is 6.0.  She is working on weight loss.  Her lipids continue to be elevated.  Total cholesterol 229, triglycerides of 70, HDL 40 and LDL 193.  Her LDL particle numbers remain high at 2430.  All LDL P is also high at 1127.  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  08/07/2021  Aimee Hall returns today for follow-up.  She has had a very good response to her Repatha with a reduction in LDL P  from 2004 and 30-1003 and 27, LDL-C is come down to 116 from 193.  Her small LDL P is come out from 1127-660 and total cholesterol down to 175 from 245.  This is somewhat of an incomplete response as expected typically more than 60% reduction.  It may be partially due to an elevated LP(a) which is borderline but still in normal range at 73.8 nmol/L.  She has lost a little weight recently but have been up to about 340 pounds.  She is trying to get back down to around 270 pounds.  She reports tolerating the Repatha well without any significant side effects.  PMHx:  Past Medical History:  Diagnosis Date   Asthma    Dehydration    Diabetes mellitus without complication (HCC)    GERD (gastroesophageal reflux disease)    Syncopal episodes     No past surgical history on file.  FAMHx:  Family History  Problem Relation Age of Onset   Syncope episode Other     SOCHx:   reports that she has never smoked. She has never used smokeless tobacco. She reports that she does not drink alcohol and does not use drugs.  ALLERGIES:  Allergies  Allergen Reactions   Atorvastatin Other (See Comments)    Muscle cramps   Dilaudid [Hydromorphone]    Iodinated Contrast Media Hives   Shellfish Allergy     ROS: Pertinent items noted in HPI and remainder of comprehensive ROS otherwise negative.  HOME MEDS: Current  Outpatient Medications on File Prior to Visit  Medication Sig Dispense Refill   Evolocumab (REPATHA SURECLICK) XX123456 MG/ML SOAJ Inject 1 Dose into the skin every 14 (fourteen) days. 2 mL 11   levonorgestrel (MIRENA, 52 MG,) 20 MCG/DAY IUD Mirena 20 mcg/24 hours (8 yrs) 52 mg intrauterine device  Take 1 device every 6 hours by intrauterine route as directed.     losartan (COZAAR) 25 MG tablet Take 25 mg by mouth daily.     metFORMIN (GLUCOPHAGE) 500 MG tablet Take 250 mg by mouth 2 (two) times daily.     OMEPRAZOLE PO Take by mouth.     Vitamin D, Ergocalciferol, (DRISDOL) 1.25 MG (50000 UNIT)  CAPS capsule Take 50,000 Units by mouth once a week.     No current facility-administered medications on file prior to visit.    LABS/IMAGING: No results found for this or any previous visit (from the past 48 hour(s)). No results found.  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 Atorvastatin intolerant History of vasovagal syncope Type 2 diabetes Morbid obesity with weight loss efforts in place  PLAN: 1.   Aimee Hall has had some improvement in her lipids on Repatha however less than ideal response but she is tolerating it well.  This may be related to mutations in the LDL receptor plus a an elevated LP(a).  I suspect her LP(a) was even somewhat higher than started on therapy because of the reduction associated with a PCSK9 inhibitor.  She is tolerating the medicine well.  She is to continue to work on weight loss and this will additionally help her lipids.  I like to repeat an NMR in about 6 months.  If were still not at target may consider adding ezetimibe.  Pixie Casino, MD, [facility], [place] Director of the Advanced Lipid Disorders &  Cardiovascular Risk Reduction Clinic Diplomate of the American Board of Clinical Lipidology Attending Cardiologist  Direct Dial: [phone]   Fax: [phone]  Website:  [email or web address] 08/07/2021, 8:09 AM

## 2021-08-13 DIAGNOSIS — E1169 Type 2 diabetes mellitus with other specified complication: Secondary | ICD-10-CM | POA: Diagnosis not present

## 2021-08-13 DIAGNOSIS — J302 Other seasonal allergic rhinitis: Secondary | ICD-10-CM | POA: Diagnosis not present

## 2021-08-13 DIAGNOSIS — J45909 Unspecified asthma, uncomplicated: Secondary | ICD-10-CM | POA: Diagnosis not present

## 2021-08-13 DIAGNOSIS — F419 Anxiety disorder, unspecified: Secondary | ICD-10-CM | POA: Diagnosis not present

## 2021-09-13 DIAGNOSIS — G43909 Migraine, unspecified, not intractable, without status migrainosus: Secondary | ICD-10-CM | POA: Diagnosis not present

## 2021-09-13 DIAGNOSIS — R110 Nausea: Secondary | ICD-10-CM | POA: Diagnosis not present

## 2021-10-01 ENCOUNTER — Other Ambulatory Visit: Payer: Self-pay | Admitting: Obstetrics and Gynecology

## 2021-10-01 DIAGNOSIS — Z1231 Encounter for screening mammogram for malignant neoplasm of breast: Secondary | ICD-10-CM

## 2021-10-02 ENCOUNTER — Ambulatory Visit
Admission: RE | Admit: 2021-10-02 | Discharge: 2021-10-02 | Disposition: A | Discharge status: Home / Self Care | Payer: Federal, State, Local not specified - PPO | Source: Ambulatory Visit | Attending: Obstetrics and Gynecology | Admitting: Obstetrics and Gynecology

## 2021-10-02 DIAGNOSIS — Z1231 Encounter for screening mammogram for malignant neoplasm of breast: Secondary | ICD-10-CM

## 2021-10-02 DIAGNOSIS — H6092 Unspecified otitis externa, left ear: Secondary | ICD-10-CM | POA: Diagnosis not present

## 2021-10-02 DIAGNOSIS — E1169 Type 2 diabetes mellitus with other specified complication: Secondary | ICD-10-CM | POA: Diagnosis not present

## 2021-12-10 DIAGNOSIS — E1169 Type 2 diabetes mellitus with other specified complication: Secondary | ICD-10-CM | POA: Diagnosis not present

## 2021-12-24 DIAGNOSIS — R6883 Chills (without fever): Secondary | ICD-10-CM | POA: Diagnosis not present

## 2021-12-24 DIAGNOSIS — J029 Acute pharyngitis, unspecified: Secondary | ICD-10-CM | POA: Diagnosis not present

## 2021-12-24 DIAGNOSIS — R059 Cough, unspecified: Secondary | ICD-10-CM | POA: Diagnosis not present

## 2021-12-24 DIAGNOSIS — U071 COVID-19: Secondary | ICD-10-CM | POA: Diagnosis not present

## 2022-01-04 DIAGNOSIS — J018 Other acute sinusitis: Secondary | ICD-10-CM | POA: Diagnosis not present

## 2022-01-09 DIAGNOSIS — E1169 Type 2 diabetes mellitus with other specified complication: Secondary | ICD-10-CM | POA: Diagnosis not present

## 2022-01-09 DIAGNOSIS — J329 Chronic sinusitis, unspecified: Secondary | ICD-10-CM | POA: Diagnosis not present

## 2022-01-09 DIAGNOSIS — J45909 Unspecified asthma, uncomplicated: Secondary | ICD-10-CM | POA: Diagnosis not present

## 2022-03-25 ENCOUNTER — Telehealth: Payer: Self-pay | Admitting: Internal Medicine

## 2022-03-25 NOTE — Telephone Encounter (Signed)
PA for repatha faxed to [facility] at [phone]

## 2022-04-12 ENCOUNTER — Other Ambulatory Visit: Payer: Self-pay | Admitting: Internal Medicine

## 2022-05-19 DIAGNOSIS — M2550 Pain in unspecified joint: Secondary | ICD-10-CM | POA: Diagnosis not present

## 2022-05-19 DIAGNOSIS — M791 Myalgia, unspecified site: Secondary | ICD-10-CM | POA: Diagnosis not present

## 2022-05-19 DIAGNOSIS — M25561 Pain in right knee: Secondary | ICD-10-CM | POA: Diagnosis not present

## 2022-05-19 DIAGNOSIS — Z23 Encounter for immunization: Secondary | ICD-10-CM | POA: Diagnosis not present

## 2022-05-19 DIAGNOSIS — M25532 Pain in left wrist: Secondary | ICD-10-CM | POA: Diagnosis not present

## 2022-05-19 DIAGNOSIS — M25559 Pain in unspecified hip: Secondary | ICD-10-CM | POA: Diagnosis not present

## 2022-05-19 DIAGNOSIS — M25531 Pain in right wrist: Secondary | ICD-10-CM | POA: Diagnosis not present

## 2022-05-19 DIAGNOSIS — M25562 Pain in left knee: Secondary | ICD-10-CM | POA: Diagnosis not present

## 2022-05-29 DIAGNOSIS — R768 Other specified abnormal immunological findings in serum: Secondary | ICD-10-CM | POA: Diagnosis not present

## 2022-05-29 DIAGNOSIS — M1990 Unspecified osteoarthritis, unspecified site: Secondary | ICD-10-CM | POA: Diagnosis not present

## 2022-06-02 DIAGNOSIS — Z6841 Body Mass Index (BMI) 40.0 and over, adult: Secondary | ICD-10-CM | POA: Diagnosis not present

## 2022-06-02 DIAGNOSIS — Z01419 Encounter for gynecological examination (general) (routine) without abnormal findings: Secondary | ICD-10-CM | POA: Diagnosis not present

## 2022-06-02 DIAGNOSIS — Z3040 Encounter for surveillance of contraceptives, unspecified: Secondary | ICD-10-CM | POA: Diagnosis not present

## 2022-06-02 DIAGNOSIS — Z01411 Encounter for gynecological examination (general) (routine) with abnormal findings: Secondary | ICD-10-CM | POA: Diagnosis not present

## 2022-06-20 DIAGNOSIS — Z30431 Encounter for routine checking of intrauterine contraceptive device: Secondary | ICD-10-CM | POA: Diagnosis not present

## 2022-07-24 DIAGNOSIS — Z30431 Encounter for routine checking of intrauterine contraceptive device: Secondary | ICD-10-CM | POA: Diagnosis not present

## 2022-08-27 DIAGNOSIS — M25651 Stiffness of right hip, not elsewhere classified: Secondary | ICD-10-CM | POA: Diagnosis not present

## 2022-08-27 DIAGNOSIS — M25551 Pain in right hip: Secondary | ICD-10-CM | POA: Diagnosis not present

## 2022-08-27 DIAGNOSIS — M5451 Vertebrogenic low back pain: Secondary | ICD-10-CM | POA: Diagnosis not present

## 2022-08-27 DIAGNOSIS — M6281 Muscle weakness (generalized): Secondary | ICD-10-CM | POA: Diagnosis not present

## 2022-09-02 DIAGNOSIS — R5383 Other fatigue: Secondary | ICD-10-CM | POA: Diagnosis not present

## 2022-09-02 DIAGNOSIS — M542 Cervicalgia: Secondary | ICD-10-CM | POA: Diagnosis not present

## 2022-09-02 DIAGNOSIS — M25511 Pain in right shoulder: Secondary | ICD-10-CM | POA: Diagnosis not present

## 2022-09-02 DIAGNOSIS — R768 Other specified abnormal immunological findings in serum: Secondary | ICD-10-CM | POA: Diagnosis not present

## 2022-09-02 DIAGNOSIS — M1990 Unspecified osteoarthritis, unspecified site: Secondary | ICD-10-CM | POA: Diagnosis not present

## 2022-09-02 DIAGNOSIS — M25512 Pain in left shoulder: Secondary | ICD-10-CM | POA: Diagnosis not present

## 2022-09-02 DIAGNOSIS — M549 Dorsalgia, unspecified: Secondary | ICD-10-CM | POA: Diagnosis not present

## 2022-09-05 DIAGNOSIS — M5451 Vertebrogenic low back pain: Secondary | ICD-10-CM | POA: Diagnosis not present

## 2022-09-05 DIAGNOSIS — M6281 Muscle weakness (generalized): Secondary | ICD-10-CM | POA: Diagnosis not present

## 2022-09-05 DIAGNOSIS — M25551 Pain in right hip: Secondary | ICD-10-CM | POA: Diagnosis not present

## 2022-09-05 DIAGNOSIS — M25651 Stiffness of right hip, not elsewhere classified: Secondary | ICD-10-CM | POA: Diagnosis not present

## 2022-09-12 DIAGNOSIS — M25551 Pain in right hip: Secondary | ICD-10-CM | POA: Diagnosis not present

## 2022-09-12 DIAGNOSIS — M25651 Stiffness of right hip, not elsewhere classified: Secondary | ICD-10-CM | POA: Diagnosis not present

## 2022-09-12 DIAGNOSIS — M6281 Muscle weakness (generalized): Secondary | ICD-10-CM | POA: Diagnosis not present

## 2022-09-12 DIAGNOSIS — M5451 Vertebrogenic low back pain: Secondary | ICD-10-CM | POA: Diagnosis not present

## 2022-09-19 DIAGNOSIS — M25651 Stiffness of right hip, not elsewhere classified: Secondary | ICD-10-CM | POA: Diagnosis not present

## 2022-09-19 DIAGNOSIS — M6281 Muscle weakness (generalized): Secondary | ICD-10-CM | POA: Diagnosis not present

## 2022-09-19 DIAGNOSIS — E1169 Type 2 diabetes mellitus with other specified complication: Secondary | ICD-10-CM | POA: Diagnosis not present

## 2022-09-19 DIAGNOSIS — U071 COVID-19: Secondary | ICD-10-CM | POA: Diagnosis not present

## 2022-09-19 DIAGNOSIS — J029 Acute pharyngitis, unspecified: Secondary | ICD-10-CM | POA: Diagnosis not present

## 2022-09-19 DIAGNOSIS — M5451 Vertebrogenic low back pain: Secondary | ICD-10-CM | POA: Diagnosis not present

## 2022-09-19 DIAGNOSIS — M25551 Pain in right hip: Secondary | ICD-10-CM | POA: Diagnosis not present

## 2022-09-24 DIAGNOSIS — R053 Chronic cough: Secondary | ICD-10-CM | POA: Diagnosis not present

## 2022-09-24 DIAGNOSIS — U071 COVID-19: Secondary | ICD-10-CM | POA: Diagnosis not present

## 2022-09-24 DIAGNOSIS — E1169 Type 2 diabetes mellitus with other specified complication: Secondary | ICD-10-CM | POA: Diagnosis not present

## 2022-09-26 DIAGNOSIS — Z0000 Encounter for general adult medical examination without abnormal findings: Secondary | ICD-10-CM | POA: Diagnosis not present

## 2022-09-26 DIAGNOSIS — E7800 Pure hypercholesterolemia, unspecified: Secondary | ICD-10-CM | POA: Diagnosis not present

## 2022-09-26 DIAGNOSIS — E1169 Type 2 diabetes mellitus with other specified complication: Secondary | ICD-10-CM | POA: Diagnosis not present

## 2022-09-26 DIAGNOSIS — F419 Anxiety disorder, unspecified: Secondary | ICD-10-CM | POA: Diagnosis not present

## 2022-09-26 DIAGNOSIS — J45909 Unspecified asthma, uncomplicated: Secondary | ICD-10-CM | POA: Diagnosis not present

## 2022-11-11 DIAGNOSIS — Z6841 Body Mass Index (BMI) 40.0 and over, adult: Secondary | ICD-10-CM | POA: Diagnosis not present

## 2022-11-11 DIAGNOSIS — E1169 Type 2 diabetes mellitus with other specified complication: Secondary | ICD-10-CM | POA: Diagnosis not present

## 2022-12-02 ENCOUNTER — Other Ambulatory Visit: Payer: Self-pay | Admitting: Physician Assistant

## 2022-12-02 DIAGNOSIS — R768 Other specified abnormal immunological findings in serum: Secondary | ICD-10-CM | POA: Diagnosis not present

## 2022-12-02 DIAGNOSIS — M25511 Pain in right shoulder: Secondary | ICD-10-CM | POA: Diagnosis not present

## 2022-12-02 DIAGNOSIS — M542 Cervicalgia: Secondary | ICD-10-CM | POA: Diagnosis not present

## 2022-12-02 DIAGNOSIS — U099 Post covid-19 condition, unspecified: Secondary | ICD-10-CM | POA: Diagnosis not present

## 2022-12-02 DIAGNOSIS — Z1231 Encounter for screening mammogram for malignant neoplasm of breast: Secondary | ICD-10-CM

## 2022-12-02 DIAGNOSIS — R519 Headache, unspecified: Secondary | ICD-10-CM | POA: Diagnosis not present

## 2022-12-02 DIAGNOSIS — M1990 Unspecified osteoarthritis, unspecified site: Secondary | ICD-10-CM | POA: Diagnosis not present

## 2022-12-02 DIAGNOSIS — M2550 Pain in unspecified joint: Secondary | ICD-10-CM | POA: Diagnosis not present

## 2022-12-04 ENCOUNTER — Ambulatory Visit
Admission: RE | Admit: 2022-12-04 | Discharge: 2022-12-04 | Disposition: A | Discharge status: Home / Self Care | Payer: Federal, State, Local not specified - PPO | Source: Ambulatory Visit | Attending: Physician Assistant | Admitting: Physician Assistant

## 2022-12-04 DIAGNOSIS — Z1231 Encounter for screening mammogram for malignant neoplasm of breast: Secondary | ICD-10-CM | POA: Diagnosis not present

## 2022-12-09 DIAGNOSIS — M25551 Pain in right hip: Secondary | ICD-10-CM | POA: Diagnosis not present

## 2022-12-09 DIAGNOSIS — M25511 Pain in right shoulder: Secondary | ICD-10-CM | POA: Diagnosis not present

## 2022-12-16 DIAGNOSIS — E7800 Pure hypercholesterolemia, unspecified: Secondary | ICD-10-CM | POA: Diagnosis not present

## 2022-12-16 DIAGNOSIS — E1169 Type 2 diabetes mellitus with other specified complication: Secondary | ICD-10-CM | POA: Diagnosis not present

## 2022-12-16 DIAGNOSIS — Z6841 Body Mass Index (BMI) 40.0 and over, adult: Secondary | ICD-10-CM | POA: Diagnosis not present

## 2023-01-06 DIAGNOSIS — M25551 Pain in right hip: Secondary | ICD-10-CM | POA: Diagnosis not present

## 2023-01-06 DIAGNOSIS — M25511 Pain in right shoulder: Secondary | ICD-10-CM | POA: Diagnosis not present

## 2023-01-07 DIAGNOSIS — M25551 Pain in right hip: Secondary | ICD-10-CM | POA: Diagnosis not present

## 2023-01-08 DIAGNOSIS — M25551 Pain in right hip: Secondary | ICD-10-CM | POA: Diagnosis not present

## 2023-01-13 DIAGNOSIS — M25551 Pain in right hip: Secondary | ICD-10-CM | POA: Diagnosis not present

## 2023-01-16 ENCOUNTER — Other Ambulatory Visit: Payer: Self-pay | Admitting: Internal Medicine

## 2023-01-16 DIAGNOSIS — E7849 Other hyperlipidemia: Secondary | ICD-10-CM

## 2023-01-17 LAB — NMR, LIPOPROFILE
Cholesterol, Total: 235 mg/dL — ABNORMAL HIGH (ref 100–199)
HDL Particle Number: 24.2 umol/L — ABNORMAL LOW (ref >=30.5)
HDL-C: 36 mg/dL — ABNORMAL LOW (ref >39)
LDL Particle Number: 2474 nmol/L — ABNORMAL HIGH (ref <1000)
LDL Size: 20.5 nm — ABNORMAL LOW (ref >20.5)
LDL-C (NIH Calc): 183 mg/dL — ABNORMAL HIGH (ref 0–99)
LP-IR Score: 66 — ABNORMAL HIGH (ref <=45)
Small LDL Particle Number: 1355 nmol/L — ABNORMAL HIGH (ref <=527)
Triglycerides: 89 mg/dL (ref 0–149)

## 2023-01-19 NOTE — Progress Notes (Signed)
Cholesterol is much higher - will discuss therapy tomorrow  Dr Rexene Edison

## 2023-01-20 ENCOUNTER — Ambulatory Visit (HOSPITAL_BASED_OUTPATIENT_CLINIC_OR_DEPARTMENT_OTHER): Payer: Federal, State, Local not specified - PPO | Admitting: Internal Medicine

## 2023-01-20 ENCOUNTER — Encounter (HOSPITAL_BASED_OUTPATIENT_CLINIC_OR_DEPARTMENT_OTHER): Payer: Self-pay | Admitting: Internal Medicine

## 2023-01-20 VITALS — BP 113/78 | HR 70 | Ht 70.5 in | Wt 309.6 lb

## 2023-01-20 DIAGNOSIS — M25551 Pain in right hip: Secondary | ICD-10-CM | POA: Diagnosis not present

## 2023-01-20 DIAGNOSIS — E7849 Other hyperlipidemia: Secondary | ICD-10-CM

## 2023-01-20 DIAGNOSIS — T466X5D Adverse effect of antihyperlipidemic and antiarteriosclerotic drugs, subsequent encounter: Secondary | ICD-10-CM

## 2023-01-20 DIAGNOSIS — M791 Myalgia, unspecified site: Secondary | ICD-10-CM

## 2023-01-20 DIAGNOSIS — T466X5A Adverse effect of antihyperlipidemic and antiarteriosclerotic drugs, initial encounter: Secondary | ICD-10-CM

## 2023-01-20 NOTE — Progress Notes (Signed)
LIPID CLINIC CONSULT NOTE  Chief Complaint:  Follow-up dyslipidemia  Primary Care Physician: Milus Height, PA  Primary Cardiologist:  None  HPI:  Aimee Hall is a 52 y.o. female who is being seen today for the evaluation of dyslipidemia at the request of Redmon, [place], [place].  This is a pleasant 52 year old female who is kindly referred for evaluation and management of dyslipidemia.  Past medical history is significant for diabetes, reflux, multiple episodes of what sounds like vasovagal syncope and dehydration as well as asthma.  She also has a history of elevated cholesterol but has been hesitant to take statins due to a concerns about vitamin deficiencies associated with that.  Most recently her lipids show total cholesterol 257, triglycerides 141, HDL 34 and LDL of 197.  Hemoglobin A1c was 6.9.  Her PCP had recommended rosuvastatin however she had not had that filled and previously did try atorvastatin but did have side effects associated with it which were muscle spasms across her left upper chest.  She says that recently she has started up on a low carbohydrate diet but not a ketogenic diet and is losing some weight with this.  She is preferring to try to manage her lipids with dietary modification if possible.  03/19/2021  Aimee Hall returns today for follow-up.  She underwent calcium scoring which is reassuring and 0.  However given her younger age she may not yet have calcified her coronaries.  She does have diabetes on metformin however A1c is 6.0.  She is working on weight loss.  Her lipids continue to be elevated.  Total cholesterol 229, triglycerides of 70, HDL 40 and LDL 193.  Her LDL particle numbers remain high at 2430.  All LDL P is also high at 1127.  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  08/07/2021  Aimee Hall returns today for follow-up.  She has had a very good response to her Repatha with a reduction in LDL P  from 2004 and 30-1003 and 27, LDL-C is come down to 116 from 193.  Her small LDL P is come out from 1127-660 and total cholesterol down to 175 from 245.  This is somewhat of an incomplete response as expected typically more than 60% reduction.  It may be partially due to an elevated LP(a) which is borderline but still in normal range at 73.8 nmol/L.  She has lost a little weight recently but have been up to about 340 pounds.  She is trying to get back down to around 270 pounds.  She reports tolerating the Repatha well without any significant side effects.  01/20/2023  Aimee Hall is seen today in follow-up.  She tells me she recently was in [place], [place] and had a significant weight loss and felt that she was doing much better eating the foods there as far as her glycemic control.  I suspect she was also much more active.  She had been doing well on Repatha but discontinued it along with all of her other medications due to some concerns about potential side effects that she was reading online.  Not surprisingly, her cholesterol rebounded to her pretreatment levels with recent lipids showing LDL particle #2004 and 74, LDL-C of 183, HDL-C of 36 and triglycerides 89.  When she noted these lipid changes she says she has now gone back to therapy with the Repatha.  PMHx:  Past Medical History:  Diagnosis Date   Asthma    Dehydration  Diabetes mellitus without complication (HCC)    GERD (gastroesophageal reflux disease)    Syncopal episodes     No past surgical history on file.  FAMHx:  Family History  Problem Relation Age of Onset   Syncope episode Other     SOCHx:   reports that she has never smoked. She has never used smokeless tobacco. She reports that she does not drink alcohol and does not use drugs.  ALLERGIES:  Allergies  Allergen Reactions   Atorvastatin Other (See Comments)    Muscle cramps   Dilaudid [Hydromorphone]    Iodinated Contrast Media Hives   Shellfish Allergy      ROS: Pertinent items noted in HPI and remainder of comprehensive ROS otherwise negative.  HOME MEDS: Current Outpatient Medications on File Prior to Visit  Medication Sig Dispense Refill   cyclobenzaprine (FLEXERIL) 5 MG tablet Take 5 mg by mouth as needed for muscle spasms.     levonorgestrel (MIRENA, 52 MG,) 20 MCG/DAY IUD Mirena 20 mcg/24 hours (8 yrs) 52 mg intrauterine device  Take 1 device every 6 hours by intrauterine route as directed.     losartan (COZAAR) 25 MG tablet Take 25 mg by mouth daily.     REPATHA SURECLICK 140 MG/ML SOAJ INJECT 1 DOSE UNDER THE SKIN EVERY 14 DAYS 2 mL 12   Vitamin D, Ergocalciferol, (DRISDOL) 1.25 MG (50000 UNIT) CAPS capsule Take 50,000 Units by mouth once a week.     No current facility-administered medications on file prior to visit.    LABS/IMAGING: No results found for this or any previous visit (from the past 48 hour(s)). No results found.  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 Atorvastatin intolerant History of vasovagal syncope Type 2 diabetes Morbid obesity with weight loss efforts in place 0 CAC score (12/2020)  PLAN: 1.   Aimee Hall had stopped her Repatha due to concern about potential side effects with the medication but noted that her cholesterol returned to her pretreatment levels.  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  Fortunately, she had no coronary calcification back in 2022.  She has now restarted her Repatha.  Will plan repeat lipids in about 6 months and I will continue to try to target her LDL to 50% reduction or ideally less than 70 if possible.  Follow-up with me in 6 months.  Chrystie Nose, MD, [facility], FACP  [place]  [facility]  Medical Director of the Advanced Lipid Disorders &  Cardiovascular Risk Reduction Clinic Diplomate of the American Board of Clinical Lipidology Attending Cardiologist  Direct Dial: [phone]  Fax: [phone]  Website:  [email or web address]  Iantha Fallen  C Zaviyar Rahal 01/20/2023, 10:32 AM

## 2023-01-20 NOTE — Patient Instructions (Signed)
Medication Instructions:  RESUME Repatha Sureclick   *If you need a refill on your cardiac medications before your next appointment, please call your pharmacy*   Lab Work: FASTING lab work in 6 months  If you have labs (blood work) drawn today and your tests are completely normal, you will receive your results only by: [employer] (if you have MyChart) OR A paper copy in the mail If you have any lab test that is abnormal or we need to change your treatment, we will call you to review the results.   Follow-Up: At [facility], you and your health needs are our priority.  As part of our continuing mission to provide you with exceptional heart care, we have created designated Provider Care Teams.  These Care Teams include your primary Cardiologist (physician) and Advanced Practice Providers (APPs -  Physician Assistants and Nurse Practitioners) who all work together to provide you with the care you need, when you need it.  We recommend signing up for the patient portal called "MyChart".  Sign up information is provided on this After Visit Summary.  MyChart is used to connect with patients for Virtual Visits (Telemedicine).  Patients are able to view lab/test results, encounter notes, upcoming appointments, etc.  Non-urgent messages can be sent to your provider as well.   To learn more about what you can do with MyChart, go to [email or web address].    Your next appointment:    6 months with Dr. Rennis Golden or Marcelino Duster NP

## 2023-01-26 ENCOUNTER — Other Ambulatory Visit (HOSPITAL_COMMUNITY): Payer: Self-pay

## 2023-01-27 DIAGNOSIS — M25551 Pain in right hip: Secondary | ICD-10-CM | POA: Diagnosis not present

## 2023-02-02 ENCOUNTER — Encounter (HOSPITAL_BASED_OUTPATIENT_CLINIC_OR_DEPARTMENT_OTHER): Payer: Self-pay | Admitting: Internal Medicine

## 2023-02-02 DIAGNOSIS — K648 Other hemorrhoids: Secondary | ICD-10-CM | POA: Diagnosis not present

## 2023-02-02 DIAGNOSIS — Z1211 Encounter for screening for malignant neoplasm of colon: Secondary | ICD-10-CM | POA: Diagnosis not present

## 2023-02-02 DIAGNOSIS — D123 Benign neoplasm of transverse colon: Secondary | ICD-10-CM | POA: Diagnosis not present

## 2023-02-03 ENCOUNTER — Telehealth: Payer: Self-pay

## 2023-02-03 DIAGNOSIS — M25551 Pain in right hip: Secondary | ICD-10-CM | POA: Diagnosis not present

## 2023-02-03 NOTE — Telephone Encounter (Signed)
PA initiated, please see separate encounter for updates on determination. (I will route you back in once a decision has been made)  Pharoah Goggins, CPhT Pharmacy Patient Advocate Specialist Direct Number: [phone] Fax: [phone]  

## 2023-02-03 NOTE — Telephone Encounter (Addendum)
Pharmacy Patient Advocate Encounter   Received notification from [facility] that prior authorization for REPATHA is needed.    PA submitted on 02/06/23 VIA FAX Status is pending  [name], CPhT Pharmacy Patient Advocate Specialist Direct Number: [phone] Fax: [phone]

## 2023-02-06 ENCOUNTER — Other Ambulatory Visit (HOSPITAL_COMMUNITY): Payer: Self-pay

## 2023-02-09 NOTE — Telephone Encounter (Signed)
Pt notified via mychart message.

## 2023-02-09 NOTE — Telephone Encounter (Signed)
Pharmacy Patient Advocate Encounter  Prior Authorization for REPATHA has been approved.    Effective dates: 01/07/23-02/06/24  [name], CPhT Pharmacy Patient Advocate Specialist Direct Number: [phone] Fax: [phone]

## 2023-02-11 DIAGNOSIS — M25551 Pain in right hip: Secondary | ICD-10-CM | POA: Diagnosis not present

## 2023-02-16 ENCOUNTER — Ambulatory Visit: Payer: Federal, State, Local not specified - PPO | Admitting: Neurology

## 2023-02-16 ENCOUNTER — Encounter: Payer: Self-pay | Admitting: Neurology

## 2023-02-16 VITALS — BP 144/72 | HR 70 | Ht 71.0 in | Wt 309.0 lb

## 2023-02-16 DIAGNOSIS — R519 Headache, unspecified: Secondary | ICD-10-CM

## 2023-02-16 DIAGNOSIS — G43711 Chronic migraine without aura, intractable, with status migrainosus: Secondary | ICD-10-CM | POA: Diagnosis not present

## 2023-02-16 DIAGNOSIS — H539 Unspecified visual disturbance: Secondary | ICD-10-CM | POA: Diagnosis not present

## 2023-02-16 DIAGNOSIS — R51 Headache with orthostatic component, not elsewhere classified: Secondary | ICD-10-CM

## 2023-02-16 DIAGNOSIS — R5383 Other fatigue: Secondary | ICD-10-CM

## 2023-02-16 MED ORDER — AJOVY 225 MG/1.5ML ~~LOC~~ SOAJ: 225.0000 mg | SUBCUTANEOUS | Status: DC

## 2023-02-16 MED ORDER — ONDANSETRON 4 MG PO TBDP: 4.0000 mg | ORAL_TABLET | Freq: Three times a day (TID) | ORAL | 3 refills | Status: DC | PRN

## 2023-02-16 MED ORDER — RIZATRIPTAN BENZOATE 10 MG PO TBDP: 10.0000 mg | ORAL_TABLET | ORAL | 11 refills | Status: DC | PRN

## 2023-02-16 MED ORDER — FREMANEZUMAB-VFRM 225 MG/1.5ML ~~LOC~~ SOSY
225.0000 mg | PREFILLED_SYRINGE | Freq: Once | SUBCUTANEOUS | Status: DC
Start: 2023-02-16 — End: 2023-12-10

## 2023-02-16 MED ORDER — UBRELVY 100 MG PO TABS: 100.0000 mg | ORAL_TABLET | ORAL | Status: DC | PRN

## 2023-02-16 NOTE — Progress Notes (Signed)
[facility]    Provider:  Dr Lucia Gaskins Requesting Provider: Milus Height, PA Primary Care Provider:  Milus Height, PA  CC: Headache, post-COVID  HPI:  Aimee Hall is a 52 y.o. female here as requested by Milus Height, PA for headache, post-COVID.  I reviewed Redmon, Noelle, PA's notes: She has a past medical history of headaches post COVID in February, also vitamin D deficiency, GERD, reactive airway disease, migraine headache, seasonal allergies, anxiety, type 2 diabetes, hypertensive retinopathy bilateral, morbid obesity, high cholesterol, familial hyperlipidemia, remote history of depression and some situational anxiety, hirsutism [employer] up which she sent to dermatology.  She hurts on the entire right side of her body shoulders neck hips legs, she started seeing physical therapy but did not feel she was being taken seriously, additionally since having COVID in February she has began having daily headaches, she is tired all the time, requiring naps to get through her day, just feels like she cannot concentrate on things and function like she used to do.  She was given diclofenac, diagnosed with headache, and arthralgia of multiple joints also using diclofenac and referred to sports medicine as well.  She states that her headaches are continually increasing along with exhaustion and a message, in April she reported a 4-day migraine with visual disturbances and had a Toradol shot, her migraines are often seasonal, her anxiety is flared with job stressors her physical exam was normal including general, neck, chest, lungs, heart, abdomen, extremities, neurologic, skin, psychology, she was referred to a needle dietitian for her morbid obesity, hemoglobin A1c in February 2024 was 6.0  Before covid she would get cluster of headaches in the spring and fall. She has allergies. Daily headaches during these times. Lightning across your head. At 3am. Light/sound/smell sensitivy,  nausea, vomiting. Can moderate to severe, worse in the allergy seasons, she does not take much for it. At least 1/2 the month. It has been different since covid in 2023, this past February end of the month she went to work and caught covid. No autonomic symtoms. She started with thoroat problems and intense headache intense lightning bolts. Since then the migraines and shooting pain pain happen at the same time and she has to go to the ED. Since then she wakes up with headaches, she is fatigued since covid and she has always sprung out of bed, she wa sitting in the car and fell asleep, she is severely exhausted, the migraines can be moderate to severe, daily headache and then at least 15 mod to severe migraines can last up to 1-3 days. Started having migraines since teenager.   Reviewed notes, labs and imaging from outside physicians, which showed:  From a thorough review of records, medications tried that can be used in migraine and headache management include Flexeril, Decadron injections, Dilaudid which she was given once, Advil, ketorolac injections, losartan, Zofran injections and ODT, tramadol, she is on Mirena, vitamin D, diclofenac tablet, unclear what other medications she has tried for migraine management from reviewing her notes I also looked in "Care Everywhere". Topamax, amitriptyline and propranolol. Sumatriptan, maxalt, aimovig contraindicated due to contipation. No symptoms of IDIOPATHIC INTRACRANIAL HYPERTENSION. She has had her eyes checked and no papilledema.   I reviewed her notes her metabolic panel was normal with BUN 12 and creatinine 0.84 collected May 19, 2022.  Hemoglobin A1c in February 2024 was 6.0.  Other labs that have been taken include from May 19, 2022 rheumatoid factor which was slightly elevated 16.2 (  upper limit of normal is 14), CRP was 12 (upper limit of normal is 10) ANA IFA was negative, sed rate was 21 which again is slightly elevated (upper limit normal 20),  uric acid normal, CBC normal.  CT Head 2006: Clinical Data:   Several days of headaches.   HEAD CT WITHOUT CONTRAST:  Technique:  5 mm contiguous axial images were obtained from the base of the skull through the vertex according to standard protocol without contrast.  Findings:  There is no evidence of intracranial hemorrhage, brain edema, or mass effect. The ventricles are normal. No extra-axial abnormalities are identified.  Comparison is made with digitized [facility] Head CT, 05/02/00, with findings consistent with acute to subacute slight right greater than left maxillary paranasal sinusitis. The remaining visualized sinuses and bilateral mastoid air cells are clear.   IMPRESSION:  Since [facility] Head CT, 05/02/00:  1.  Slight right greater than left subacute to acute maxillary sinusitis.  2.  Otherwise normal.        Latest Ref Rng & Units 12/19/2016   12:25 AM 08/18/2015    8:20 PM 10/29/2013    3:56 AM  CBC  WBC 4.0 - 10.5 K/uL 9.7  10.4  11.2   Hemoglobin 12.0 - 15.0 g/dL 16.1  09.6  04.5   Hematocrit 36.0 - 46.0 % 36.7  38.9  37.8   Platelets 150 - 400 K/uL 270  305  294       Latest Ref Rng & Units 12/19/2016   12:25 AM 08/18/2015    8:20 PM 10/29/2013    3:56 AM  CMP  Glucose 65 - 99 mg/dL 409  811  914   BUN 6 - 20 mg/dL 19  18  12    Creatinine 0.44 - 1.00 mg/dL 7.82  9.56  2.13   Sodium 135 - 145 mmol/L 136  136  138   Potassium 3.5 - 5.1 mmol/L 3.4  4.1  4.3   Chloride 101 - 111 mmol/L 103  105  101   CO2 22 - 32 mmol/L 22  23  26    Calcium 8.9 - 10.3 mg/dL 8.0  9.3  9.3   Total Protein 6.5 - 8.1 g/dL 7.0  7.9    Total Bilirubin 0.3 - 1.2 mg/dL 0.8  0.3    Alkaline Phos 38 - 126 U/L 57  60    AST 15 - 41 U/L 13  19    ALT 14 - 54 U/L 15  24       Review of Systems: Patient complains of symptoms per HPI as well as the following symptoms migraine. Pertinent negatives and positives per HPI. All others negative.   Social History   Socioeconomic History   Marital status:  Divorced    Spouse name: Not on file   Number of children: Not on file   Years of education: Not on file   Highest education level: Not on file  Occupational History   Not on file  Tobacco Use   Smoking status: Never   Smokeless tobacco: Never  Substance and Sexual Activity   Alcohol use: No   Drug use: No   Sexual activity: Not Currently  Other Topics Concern   Not on file  Social History Narrative   Not on file   Social Determinants of Health   Financial Resource Strain: Not on file  Food Insecurity: Not on file  Transportation Needs: Not on file  Physical Activity: Not on file  Stress: Not  on file  Social Connections: Unknown (12/13/2021)   Received from [facility]   Social Network    Social Network: Not on file  Intimate Partner Violence: Unknown (11/04/2021)   Received from [facility]   HITS    Physically Hurt: Not on file    Insult or Talk Down To: Not on file    Threaten Physical Harm: Not on file    Scream or Curse: Not on file    Family History  Problem Relation Age of Onset   Syncope episode Other    Headache Neg Hx    Sleep apnea Neg Hx     Past Medical History:  Diagnosis Date   Asthma    Dehydration    Diabetes mellitus without complication (HCC)    GERD (gastroesophageal reflux disease)    Syncopal episodes     Patient Active Problem List   Diagnosis Date Noted   Chronic migraine without aura, with intractable migraine, so stated, with status migrainosus 02/16/2023    Past Surgical History:  Procedure Laterality Date   NO PAST SURGERIES      Current Outpatient Medications  Medication Sig Dispense Refill   cyclobenzaprine (FLEXERIL) 5 MG tablet Take 5 mg by mouth as needed for muscle spasms.     Fremanezumab-vfrm (AJOVY) 225 MG/1.5ML SOAJ Inject 225 mg into the skin every 30 (thirty) days.     levonorgestrel (MIRENA, 52 MG,) 20 MCG/DAY IUD Mirena 20 mcg/24 hours (8 yrs) 52 mg intrauterine device  Take 1 device every 6 hours by  intrauterine route as directed.     losartan (COZAAR) 25 MG tablet Take 25 mg by mouth daily.     ondansetron (ZOFRAN-ODT) 4 MG disintegrating tablet Take 1-2 tablets (4-8 mg total) by mouth every 8 (eight) hours as needed. 30 tablet 3   REPATHA SURECLICK 140 MG/ML SOAJ INJECT 1 DOSE UNDER THE SKIN EVERY 14 DAYS 2 mL 12   rizatriptan (MAXALT-MLT) 10 MG disintegrating tablet Take 1 tablet (10 mg total) by mouth as needed for migraine. May repeat in 2 hours if needed 9 tablet 11   Ubrogepant (UBRELVY) 100 MG TABS Take 1 tablet (100 mg total) by mouth every 2 (two) hours as needed. Maximum 200mg  a day.     Vitamin D, Ergocalciferol, (DRISDOL) 1.25 MG (50000 UNIT) CAPS capsule Take 50,000 Units by mouth once a week.     Current Facility-Administered Medications  Medication Dose Route Frequency Provider Last Rate Last Admin   Fremanezumab-vfrm SOSY 225 mg  225 mg Subcutaneous Once         Allergies as of 02/16/2023 - Review Complete 02/16/2023  Allergen Reaction Noted   Atorvastatin Other (See Comments) 11/04/2020   Dilaudid [hydromorphone]  10/29/2013   Iodinated contrast media Hives 11/01/2012   Shellfish allergy  10/29/2013    Vitals: BP (!) 144/72   Pulse 70   Ht 5\' 11"  (1.803 m)   Wt (!) 309 lb (140.2 kg)   BMI 43.10 kg/m  Last Weight:  Wt Readings from Last 1 Encounters:  02/16/23 (!) 309 lb (140.2 kg)   Last Height:   Ht Readings from Last 1 Encounters:  02/16/23 5\' 11"  (1.803 m)     Physical exam: Exam: Gen: NAD, conversant, well nourised, obese, well groomed                     CV: RRR, no MRG. No Carotid Bruits. No peripheral edema, warm, nontender Eyes: Conjunctivae clear without exudates or  hemorrhage  Neuro: Detailed Neurologic Exam  Speech:    Speech is normal; fluent and spontaneous with normal comprehension.  Cognition:    The patient is oriented to person, place, and time;     recent and remote memory intact;     language fluent;     normal attention,  concentration,     fund of knowledge Cranial Nerves:    The pupils are equal, round, and reactive to light. Difficulty visualizing fundi but appeared to have normal opric nerves Visual fields are full to finger confrontation. Extraocular movements are intact. Trigeminal sensation is intact and the muscles of mastication are normal. The face is symmetric. The palate elevates in the midline. Hearing intact. Voice is normal. Shoulder shrug is normal. The tongue has normal motion without fasciculations.   Coordination: nml  Gait: nml  Motor Observation:    No asymmetry, no atrophy, and no involuntary movements noted. Tone:    Normal muscle tone.    Posture:    Posture is normal. normal erect    Strength:    Strength is V/V in the upper and lower limbs.      Sensation: intact to LT     Reflex Exam:  DTR's:    Deep tendon reflexes in the upper and lower extremities are normal bilaterally.   Toes:    The toes are downgoing bilaterally.   Clonus:    Clonus is absent.    Assessment/Plan:  Patient with chronic migraines however given concenring symptoms requires thorough evaluation  Discussed New meds: Ajovy, Emgality, Vyepti, [place], [place] , nurtec, Aimovig - new CGRP medications. Also do botox.   Other more traditional meds include topiramate, propranolol, amitriptyline which you have tried  My recommendations:   Start Ajovy monthly - preventative - gave 3 samples and she will come back in 3 months and let me know if she would like a prescription, was very hesitant Acute:  Ubrelvy samples to take as needed - if she likes them we can prescribe Rizatriptan and ondansetron at onset as well Ondansteron for nausea TSH lab MRI brain w/wo contrast: MRI brain due to concerning symptoms of morning headaches, positional and exertional headaches,vision changes, worsening headaches  to look for space occupying mass, chiari or intracranial hypertension (pseudotumor), strokes,  malignancies, vasculidities, demyelination(multiple sclerosis) or other  ONO monitor - oxygenation monitor ordered  Orders Placed This Encounter  Procedures   MR BRAIN W WO CONTRAST   TSH Rfx on Abnormal to Free T4   Meds ordered this encounter  Medications   rizatriptan (MAXALT-MLT) 10 MG disintegrating tablet    Sig: Take 1 tablet (10 mg total) by mouth as needed for migraine. May repeat in 2 hours if needed    Dispense:  9 tablet    Refill:  11   ondansetron (ZOFRAN-ODT) 4 MG disintegrating tablet    Sig: Take 1-2 tablets (4-8 mg total) by mouth every 8 (eight) hours as needed.    Dispense:  30 tablet    Refill:  3   Ubrogepant (UBRELVY) 100 MG TABS    Sig: Take 1 tablet (100 mg total) by mouth every 2 (two) hours as needed. Maximum 200mg  a day.   Fremanezumab-vfrm SOSY 225 mg   Fremanezumab-vfrm (AJOVY) 225 MG/1.5ML SOAJ    Sig: Inject 225 mg into the skin every 30 (thirty) days.    Patient has copay card; she can have medication regardless of insurance approval or copay amount.    Cc: Milus Height, PA,  [place], [place], [place]  Naomie Dean, MD  [facility] [address] [place], [place] [address]  Phone [phone] Fax [phone]

## 2023-02-16 NOTE — Patient Instructions (Addendum)
New meds: Aimee Hall, [place], [place], Aimee Hall , nurtec, Aimovig - new CGRP medications. Also do botox.   Other more traditional meds include topiramate, propranolol, amitriptyline which you have tried  My recommendations:   Start Ajovy monthly - preventative  Acute:  Ubrelvy samples to take as needed Rizatriptan and ondansetron at onset as well Ondansteron for nausea TSH MRI brain w/wo contrast ONO monitor - oxygenation monitor  Migraine Headache A migraine headache is an intense pulsing or throbbing pain on one or both sides of the head. Migraine headaches may also cause other symptoms, such as nausea, vomiting, and sensitivity to light and noise. A migraine headache can last from 4 hours to 3 days. Talk with your health care provider about what things may bring on (trigger) your migraine headaches. What are the causes? The exact cause is not known. However, a migraine may be caused when nerves in the brain get irritated and release chemicals that cause blood vessels to become inflamed. This inflammation causes pain. Migraines may be triggered or caused by: Smoking. Medicines, such as: Nitroglycerin, which is used to treat chest pain. Birth control pills. Estrogen. Certain blood pressure medicines. Foods or drinks that contain nitrates, glutamate, aspartame, MSG, or tyramine. Certain foods or drinks, such as aged cheeses, chocolate, alcohol, or caffeine. Doing physical activity that is very hard. Other triggers may include: Menstruation. Pregnancy. Hunger. Stress. Getting too much or too little sleep. Weather changes. Tiredness (fatigue). What increases the risk? The following factors may make you more likely to have migraine headaches: Being between the ages of 7-72 years old. Being female. Having a family history of migraine headaches. Being Caucasian. Having a mental health condition, such as depression or anxiety. Being obese. What are the signs or  symptoms? The main symptom of this condition is pulsing or throbbing pain. This pain may: Happen in any area of the head, such as on one or both sides. Make it hard to do daily activities. Get worse with physical activity. Get worse around bright lights, loud noises, or smells. Other symptoms may include: Nausea. Vomiting. Dizziness. Before a migraine headache starts, you may get warning signs (an aura). An aura may include: Seeing flashing lights or having blind spots. Seeing bright spots, halos, or zigzag lines. Having tunnel vision or blurred vision. Having numbness or a tingling feeling. Having trouble talking. Having muscle weakness. After a migraine ends, you may have symptoms. These may include: Feeling tired. Trouble concentrating. How is this diagnosed? A migraine headache can be diagnosed based on: Your symptoms. A physical exam. Tests, such as: A CT scan or an MRI of the head. These tests can help rule out other causes of headaches. Taking fluid from the spine (lumbar puncture) to examine it (cerebrospinal fluid analysis, or CSF analysis). How is this treated? This condition may be treated with medicines that: Relieve pain and nausea. Prevent migraines. Treatment may also include: Acupuncture. Lifestyle changes like avoiding foods that trigger migraine headaches. Learning ways to control your body (biofeedback). Talk therapy to help you know and deal with negative thoughts (cognitive behavioral therapy). Follow these instructions at home: Medicines Take over-the-counter and prescription medicines only as told by your provider. Ask your provider if the medicine prescribed to you: Requires you to avoid driving or using machinery. Can cause constipation. You may need to take these actions to prevent or treat constipation: Drink enough fluid to keep your pee (urine) pale yellow. Take over-the-counter or prescription medicines. Eat foods that are high in fiber,  such  as beans, whole grains, and fresh fruits and vegetables. Limit foods that are high in fat and processed sugars, such as fried or sweet foods. Lifestyle  Do not drink alcohol. Do not use any products that contain nicotine or tobacco. These products include cigarettes, chewing tobacco, and vaping devices, such as e-cigarettes. If you need help quitting, ask your provider. Get 7-9 hours of sleep each night, or the amount recommended by your provider. Find ways to manage stress, such as meditation, deep breathing, or yoga. Try to exercise regularly. This can help lessen how bad and how often your migraines occur. General instructions Keep a journal to find out what triggers your migraines, so you can avoid those things. For example, write down: What you eat and drink. How much sleep you get. Any change to your diet or medicines. If you have a migraine headache: Avoid things that make your symptoms worse, such as bright lights. Lie down in a dark, quiet room. Do not drive or use machinery. Ask your provider what activities are safe for you while you have symptoms. Keep all follow-up visits. Your provider will monitor your symptoms and recommend any further treatment. Where to find more information Coalition for Headache and Migraine Patients (CHAMP): headachemigraine.org American Migraine Foundation: americanmigrainefoundation.org National Headache Foundation: headaches.org Contact a health care provider if: You have symptoms that are different or worse than your usual migraine headache symptoms. You have more than 15 days of headaches in one month. Get help right away if: Your migraine headache becomes severe or lasts more than 72 hours. You have a fever or stiff neck. You have vision loss. Your muscles feel weak or like you cannot control them. You lose your balance often or have trouble walking. You faint. You have a seizure. This information is not intended to replace advice given  to you by your health care provider. Make sure you discuss any questions you have with your health care provider. Document Revised: 03/17/2022 Document Reviewed: 03/17/2022 Elsevier Patient Education  2024 [employer].

## 2023-02-17 ENCOUNTER — Telehealth: Payer: Self-pay | Admitting: Neurology

## 2023-02-17 ENCOUNTER — Telehealth: Payer: Self-pay | Admitting: *Deleted

## 2023-02-17 DIAGNOSIS — G4733 Obstructive sleep apnea (adult) (pediatric): Secondary | ICD-10-CM

## 2023-02-17 DIAGNOSIS — R0689 Other abnormalities of breathing: Secondary | ICD-10-CM

## 2023-02-17 LAB — TSH RFX ON ABNORMAL TO FREE T4: TSH: 1.99 u[IU]/mL (ref 0.450–4.500)

## 2023-02-17 NOTE — Telephone Encounter (Signed)
[facility] confirmed receipt of order.  

## 2023-02-17 NOTE — Telephone Encounter (Signed)
-----   Message from Anson Fret sent at 02/16/2023  4:54 PM EDT ----- Regarding: ONO monitor Patient with extreme fatigue and morning headaches. Hesitatnt to get a sleep evaluation. Agreed to ONO overnight for likely OSA please order for her dx OSA, hypoventilation

## 2023-02-17 NOTE — Telephone Encounter (Signed)
[employer] federal NPR sent to GI due to her weight. [phone]

## 2023-02-17 NOTE — Telephone Encounter (Signed)
Order for ONO sent to [facility].

## 2023-02-26 NOTE — Telephone Encounter (Signed)
ONO results received from [facility]. They have been placed on Dr Trevor Mace desk for review. Copy sent to medical records.

## 2023-03-02 ENCOUNTER — Ambulatory Visit: Payer: Federal, State, Local not specified - PPO | Admitting: Psychiatry

## 2023-03-02 DIAGNOSIS — Z23 Encounter for immunization: Secondary | ICD-10-CM | POA: Diagnosis not present

## 2023-03-27 DIAGNOSIS — E7800 Pure hypercholesterolemia, unspecified: Secondary | ICD-10-CM | POA: Diagnosis not present

## 2023-03-27 DIAGNOSIS — H35033 Hypertensive retinopathy, bilateral: Secondary | ICD-10-CM | POA: Diagnosis not present

## 2023-03-27 DIAGNOSIS — E1169 Type 2 diabetes mellitus with other specified complication: Secondary | ICD-10-CM | POA: Diagnosis not present

## 2023-04-08 ENCOUNTER — Encounter: Payer: Self-pay | Admitting: Neurology

## 2023-04-08 DIAGNOSIS — R5383 Other fatigue: Secondary | ICD-10-CM

## 2023-04-08 DIAGNOSIS — G4734 Idiopathic sleep related nonobstructive alveolar hypoventilation: Secondary | ICD-10-CM

## 2023-04-08 DIAGNOSIS — R519 Headache, unspecified: Secondary | ICD-10-CM

## 2023-04-08 NOTE — Telephone Encounter (Signed)
Requested ONO results. Looks like we got them back in July but they aren't scanned into the chart yet.

## 2023-04-08 NOTE — Telephone Encounter (Signed)
Results have been received and are ready for Dr Lucia Gaskins to review.

## 2023-04-09 DIAGNOSIS — H35413 Lattice degeneration of retina, bilateral: Secondary | ICD-10-CM | POA: Diagnosis not present

## 2023-04-09 DIAGNOSIS — H59812 Chorioretinal scars after surgery for detachment, left eye: Secondary | ICD-10-CM | POA: Diagnosis not present

## 2023-04-09 DIAGNOSIS — E119 Type 2 diabetes mellitus without complications: Secondary | ICD-10-CM | POA: Diagnosis not present

## 2023-04-09 DIAGNOSIS — H01009 Unspecified blepharitis unspecified eye, unspecified eyelid: Secondary | ICD-10-CM | POA: Diagnosis not present

## 2023-04-14 NOTE — Telephone Encounter (Signed)
I spoke with Dr. Lucia Gaskins.  Results do show hypoxia at night. I called the patient and discussed that based on the results showing low oxygen, recommendation is to send patient to sleep doctor here for eval, ?sleep apnea, etc. The patient is amenable to the referral and I have scheduled her with Dr Frances Furbish for tomorrow 9/11 at 1:45 pm arrival 1:15 pm. She did mention that she is concerned about wearing a mask. She is aware that there are several options and these concerns can be discussed with the provider. She verbalized appreciation for the call.

## 2023-04-15 ENCOUNTER — Encounter: Payer: Self-pay | Admitting: Neurology

## 2023-04-15 ENCOUNTER — Ambulatory Visit (INDEPENDENT_AMBULATORY_CARE_PROVIDER_SITE_OTHER): Payer: Federal, State, Local not specified - PPO | Admitting: Neurology

## 2023-04-15 VITALS — BP 130/66 | HR 69 | Ht 71.0 in | Wt 310.0 lb

## 2023-04-15 DIAGNOSIS — R351 Nocturia: Secondary | ICD-10-CM

## 2023-04-15 DIAGNOSIS — G4734 Idiopathic sleep related nonobstructive alveolar hypoventilation: Secondary | ICD-10-CM

## 2023-04-15 DIAGNOSIS — R0683 Snoring: Secondary | ICD-10-CM

## 2023-04-15 DIAGNOSIS — Z9189 Other specified personal risk factors, not elsewhere classified: Secondary | ICD-10-CM | POA: Diagnosis not present

## 2023-04-15 DIAGNOSIS — R519 Headache, unspecified: Secondary | ICD-10-CM

## 2023-04-15 DIAGNOSIS — G4719 Other hypersomnia: Secondary | ICD-10-CM

## 2023-04-15 NOTE — Progress Notes (Signed)
Subjective:    Patient ID: Aimee Hall is a 52 y.o. female.  HPI    Huston Foley, MD, PhD [facility] [address] [address] [place], [place] [address]  Dear Desma Maxim,  I saw your patient, [name], upon your kind request and my sleep clinic today for initial consultation of her sleep disorder, in particular, concern for underlying obstructive sleep apnea.  The patient is unaccompanied today.  As you know, Ms. [name] is a 52 year old female with an underlying medical history of asthma, diabetes, reflux disease, history of syncope, migraine headaches, and severe obesity with a BMI of over 40, who reports some snoring and excessive daytime somnolence as well as morning headaches.  She had a recent overnight pulse oximetry test which showed repeated oxygen desaturations at night.  Her Epworth sleepiness score is 11 out of 24, fatigue severity score is 55 out of 63.  She had an overnight pulse oximetry test on room air on 02/23/2023.  Total duration of test time was 8 hours and 32 minutes, average oxygen saturation 94.9%, nadir was 78% with time below or at 88% saturation of nearly 14 minutes, 13 minutes and 54 seconds.  She wakes up with a headache often.  She has significant seasonal allergies and also history of asthma for which she has a rescue inhaler. I reviewed your office visit note from 02/16/2023.  She is not aware of any family history of sleep apnea.  She lives with her youngest daughter.  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  Bedtime is generally between 10 and 10:30 PM and rise time at 5:45 AM.  She has nocturia about twice per average night.  She drinks limited caffeine, 1 cup of coffee in the morning and is working on eliminating caffeine altogether.  She is a non-smoker and does not drink alcohol.  Her Past Medical History Is Significant For: Past Medical History:  Diagnosis  Date   Asthma    Dehydration    Diabetes mellitus without complication (HCC)    GERD (gastroesophageal reflux disease)    Syncopal episodes     Her Past Surgical History Is Significant For: Past Surgical History:  Procedure Laterality Date   NO PAST SURGERIES      Her Family History Is Significant For: Family History  Problem Relation Age of Onset   Syncope episode Other    Headache Neg Hx    Sleep apnea Neg Hx     Her Social History Is Significant For: Social History   Socioeconomic History   Marital status: Divorced    Spouse name: Not on file   Number of children: Not on file   Years of education: Not on file   Highest education level: Not on file  Occupational History   Not on file  Tobacco Use   Smoking status: Never   Smokeless tobacco: Never  Substance and Sexual Activity   Alcohol use: No   Drug use: No   Sexual activity: Not Currently  Other Topics Concern   Not on file  Social History Narrative   Not on file   Social Determinants of Health   Financial Resource Strain: Not on file  Food Insecurity: Not on file  Transportation Needs: Not on file  Physical Activity: Not on file  Stress: Not on file  Social Connections: Unknown (12/13/2021)   [facility], [facility]   Social Network  Social Network: Not on file    Her Allergies Are:  Allergies  Allergen Reactions   Atorvastatin Other (See Comments)    Muscle cramps   Dilaudid [Hydromorphone]    Iodinated Contrast Media Hives   Shellfish Allergy   :   Her Current Medications Are:  Outpatient Encounter Medications as of 04/15/2023  Medication Sig   cyclobenzaprine (FLEXERIL) 5 MG tablet Take 5 mg by mouth as needed for muscle spasms.   levonorgestrel (MIRENA, 52 MG,) 20 MCG/DAY IUD Mirena 20 mcg/24 hours (8 yrs) 52 mg intrauterine device  Take 1 device every 6 hours by intrauterine route as directed.   losartan (COZAAR) 25 MG tablet Take 25 mg by mouth daily.    neomycin-polymyxin b-dexamethasone (MAXITROL) 3.5-10000-0.1 SUSP SMARTSIG:In Eye(s)   ondansetron (ZOFRAN-ODT) 4 MG disintegrating tablet Take 1-2 tablets (4-8 mg total) by mouth every 8 (eight) hours as needed.   REPATHA SURECLICK 140 MG/ML SOAJ INJECT 1 DOSE UNDER THE SKIN EVERY 14 DAYS   rizatriptan (MAXALT-MLT) 10 MG disintegrating tablet Take 1 tablet (10 mg total) by mouth as needed for migraine. May repeat in 2 hours if needed   Ubrogepant (UBRELVY) 100 MG TABS Take 1 tablet (100 mg total) by mouth every 2 (two) hours as needed. Maximum 200mg  a day.   Vitamin D, Ergocalciferol, (DRISDOL) 1.25 MG (50000 UNIT) CAPS capsule Take 50,000 Units by mouth once a week.   [DISCONTINUED] Fremanezumab-vfrm (AJOVY) 225 MG/1.5ML SOAJ Inject 225 mg into the skin every 30 (thirty) days. (Patient not taking: Reported on 04/15/2023)   Facility-Administered Encounter Medications as of 04/15/2023  Medication   Fremanezumab-vfrm SOSY 225 mg  :   Review of Systems:  Out of a complete 14 point review of systems, all are reviewed and negative with the exception of these symptoms as listed below:  Review of Systems  Neurological:        Rm 4 alone Pt is well, reports she gets about 4 hrs of sleep a night. She has trouble with relaxing and "shutting her brain off". Has a lot of daytime fatigue.   She wakes up with morning headaches about 4 days week.  No known apneas or family history of OSA.     Objective:  Neurological Exam  Physical Exam Physical Examination:   Vitals:   04/15/23 1341  BP: 130/66  Pulse: 69    General Examination: The patient is a very pleasant 52 y.o. female in no acute distress. She appears well-developed and well-nourished and well groomed.   HEENT: Normocephalic, atraumatic, pupils are equal, round and reactive to light, corrective eyeglasses in place.  Extraocular tracking is good without limitation to gaze excursion or nystagmus noted. Hearing is grossly intact. Face is  symmetric with normal facial animation. Speech is clear with no dysarthria noted. There is no hypophonia. There is no lip, neck/head, jaw or voice tremor. Neck is supple with full range of passive and active motion. There are no carotid bruits on auscultation. Oropharynx exam reveals: mild mouth dryness, good dental hygiene and moderate airway crowding, due to small airway entry and tonsillar size of about 1+ bilaterally, right side a little easier to see compared to left.  Minimal overbite noted.  Tongue protrudes centrally and palate elevates symmetrically, slightly wider tongue noted.  Mallampati class III.  Neck circumference 17 1/8 inches.  Chest: Clear to auscultation without wheezing, rhonchi or crackles noted.  Heart: S1+S2+0, regular and normal without murmurs, rubs or gallops noted.   Abdomen: Soft, non-tender  and non-distended.  Extremities: There is no pitting edema in the distal lower extremities bilaterally.   Skin: Warm and dry without trophic changes noted.   Musculoskeletal: exam reveals no obvious joint deformities.   Neurologically:  Mental status: The patient is awake, alert and oriented in all 4 spheres. Her immediate and remote memory, attention, language skills and fund of knowledge are appropriate. There is no evidence of aphasia, agnosia, apraxia or anomia. Speech is clear with normal prosody and enunciation. Thought process is linear. Mood is normal and affect is normal.  Cranial nerves II - XII are as described above under HEENT exam.  Motor exam: Normal bulk, strength and tone is noted. There is no obvious action or resting tremor.  Fine motor skills and coordination: grossly intact.  Cerebellar testing: No dysmetria or intention tremor. There is no truncal or gait ataxia.  Sensory exam: intact to light touch in the upper and lower extremities.  Gait, station and balance: She stands easily. No veering to one side is noted. No leaning to one side is noted. Posture is  age-appropriate and stance is narrow based. Gait shows normal stride length and normal pace. No problems turning are noted.   Assessment and Plan:   In summary, Aimee Hall is a very pleasant 52 y.o.-year old female with an underlying medical history of asthma, diabetes, reflux disease, history of syncope, migraine headaches, and severe obesity with a BMI of over 40, whose history and physical exam are concerning for sleep disordered breathing, particularly obstructive sleep apnea (OSA). While a laboratory attended sleep study is typically considered "gold standard" for evaluation of sleep disordered breathing, we mutually agreed to proceed with a home sleep test at this time.   I had a long chat with the patient about my findings and the diagnosis of sleep apnea, particularly OSA, its prognosis and treatment options. We talked about medical/conservative treatments, surgical interventions and non-pharmacological approaches for symptom control. I explained, in particular, the risks and ramifications of untreated moderate to severe OSA, especially with respect to developing cardiovascular disease down the road, including congestive heart failure (CHF), difficult to treat hypertension, cardiac arrhythmias (particularly A-fib), neurovascular complications including TIA, stroke and dementia. Even type 2 diabetes has, in part, been linked to untreated OSA. Symptoms of untreated OSA may include (but may not be limited to) daytime sleepiness, nocturia (i.e. frequent nighttime urination), memory problems, mood irritability and suboptimally controlled or worsening mood disorder such as depression and/or anxiety, lack of energy, lack of motivation, physical discomfort, as well as recurrent headaches, especially morning or nocturnal headaches. We talked about the importance of maintaining a healthy lifestyle and striving for healthy weight.  I recommended a sleep study at this time. I outlined the differences between a  laboratory attended sleep study which is considered more comprehensive and accurate over the option of a home sleep test (HST); the latter may lead to underestimation of sleep disordered breathing in some instances and does not help with diagnosing upper airway resistance syndrome and is not accurate enough to diagnose primary central sleep apnea typically. I outlined possible surgical and non-surgical treatment options of OSA, including the use of a positive airway pressure (PAP) device (i.e. CPAP, AutoPAP/APAP or BiPAP in certain circumstances), a custom-made dental device (aka oral appliance, which would require a referral to a specialist dentist or orthodontist typically, and is generally speaking not considered for patients with full dentures or edentulous state), upper airway surgical options, such as traditional UPPP (which is  not considered a first-line treatment) or the Inspire device (hypoglossal nerve stimulator, which would involve a referral for consultation with an ENT surgeon, after careful selection, following inclusion criteria - also not first-line treatment). I explained the PAP treatment option to the patient in detail, as this is generally considered first-line treatment.  The patient indicated that she would be willing to try PAP therapy, if the need arises. I explained the importance of being compliant with PAP treatment, not only for insurance purposes but primarily to improve patient's symptoms symptoms, and for the patient's long term health benefit, including to reduce Her cardiovascular risks longer-term.    We will pick up our discussion about the next steps and treatment options after testing.  We will keep her posted as to the test results by phone call and/or MyChart messaging where possible.  We will plan to follow-up in sleep clinic accordingly as well.  I answered all her questions today and the patient was in agreement.   I encouraged her to call with any interim questions,  concerns, problems or updates or email [place] through MyChart.  Generally speaking, sleep test authorizations may take up to 2 weeks, sometimes less, sometimes longer, the patient is encouraged to get in touch with [place] if they do not hear back from the sleep lab staff directly within the next 2 weeks.  Thank you very much for allowing me to participate in the care of this nice patient. If I can be of any further assistance to you please do not hesitate to talk to me.   Sincerely,   Huston Foley, MD, PhD

## 2023-04-15 NOTE — Patient Instructions (Signed)

## 2023-04-21 DIAGNOSIS — H01009 Unspecified blepharitis unspecified eye, unspecified eyelid: Secondary | ICD-10-CM | POA: Diagnosis not present

## 2023-04-21 DIAGNOSIS — H02889 Meibomian gland dysfunction of unspecified eye, unspecified eyelid: Secondary | ICD-10-CM | POA: Diagnosis not present

## 2023-04-29 DIAGNOSIS — E559 Vitamin D deficiency, unspecified: Secondary | ICD-10-CM | POA: Diagnosis not present

## 2023-04-29 DIAGNOSIS — Z1389 Encounter for screening for other disorder: Secondary | ICD-10-CM | POA: Diagnosis not present

## 2023-04-29 DIAGNOSIS — E7800 Pure hypercholesterolemia, unspecified: Secondary | ICD-10-CM | POA: Diagnosis not present

## 2023-04-29 DIAGNOSIS — E1169 Type 2 diabetes mellitus with other specified complication: Secondary | ICD-10-CM | POA: Diagnosis not present

## 2023-04-29 DIAGNOSIS — R5383 Other fatigue: Secondary | ICD-10-CM | POA: Diagnosis not present

## 2023-04-29 DIAGNOSIS — J45909 Unspecified asthma, uncomplicated: Secondary | ICD-10-CM | POA: Diagnosis not present

## 2023-05-08 ENCOUNTER — Other Ambulatory Visit: Payer: Self-pay | Admitting: Internal Medicine

## 2023-05-11 ENCOUNTER — Ambulatory Visit: Payer: Federal, State, Local not specified - PPO | Admitting: Neurology

## 2023-05-11 DIAGNOSIS — G4733 Obstructive sleep apnea (adult) (pediatric): Secondary | ICD-10-CM

## 2023-05-11 DIAGNOSIS — R519 Headache, unspecified: Secondary | ICD-10-CM

## 2023-05-11 DIAGNOSIS — R0683 Snoring: Secondary | ICD-10-CM

## 2023-05-11 DIAGNOSIS — R351 Nocturia: Secondary | ICD-10-CM

## 2023-05-11 DIAGNOSIS — Z9189 Other specified personal risk factors, not elsewhere classified: Secondary | ICD-10-CM

## 2023-05-11 DIAGNOSIS — G4734 Idiopathic sleep related nonobstructive alveolar hypoventilation: Secondary | ICD-10-CM

## 2023-05-11 DIAGNOSIS — G4719 Other hypersomnia: Secondary | ICD-10-CM

## 2023-05-13 DIAGNOSIS — R5383 Other fatigue: Secondary | ICD-10-CM | POA: Diagnosis not present

## 2023-05-13 DIAGNOSIS — E1169 Type 2 diabetes mellitus with other specified complication: Secondary | ICD-10-CM | POA: Diagnosis not present

## 2023-05-13 DIAGNOSIS — E559 Vitamin D deficiency, unspecified: Secondary | ICD-10-CM | POA: Diagnosis not present

## 2023-05-13 DIAGNOSIS — E7800 Pure hypercholesterolemia, unspecified: Secondary | ICD-10-CM | POA: Diagnosis not present

## 2023-05-14 ENCOUNTER — Encounter: Payer: Self-pay | Admitting: Neurology

## 2023-05-14 DIAGNOSIS — Z23 Encounter for immunization: Secondary | ICD-10-CM | POA: Diagnosis not present

## 2023-05-21 ENCOUNTER — Telehealth: Payer: Self-pay

## 2023-05-21 ENCOUNTER — Encounter: Payer: Self-pay | Admitting: Neurology

## 2023-05-21 DIAGNOSIS — G43711 Chronic migraine without aura, intractable, with status migrainosus: Secondary | ICD-10-CM

## 2023-05-21 NOTE — Procedures (Signed)
   [facility]  HOME SLEEP TEST (Watch PAT) REPORT  STUDY DATE: 05/15/23  DOB: Mar 08, 1971  MRN: [id number]  ORDERING CLINICIAN: Huston Foley, MD, PhD   REFERRING CLINICIAN: Dr. Naomie Dean  CLINICAL INFORMATION/HISTORY: 52 year old female with an underlying medical history of asthma, diabetes, reflux disease, history of syncope, migraine headaches, and severe obesity with a BMI of over 40, who reports some snoring and excessive daytime somnolence as well as morning headaches.  She had a recent overnight pulse oximetry test which showed repeated oxygen desaturations at night.   Epworth sleepiness score: 11/24.  BMI: 43.5 kg/m  FINDINGS:   Sleep Summary:   Total Recording Time (hours, min): 9 hours, 59 min  Total Sleep Time (hours, min):  9 hours, 9 min  Percent REM (%):    37.1%   Respiratory Indices:   Calculated pAHI (per hour):  31.8/hour         REM pAHI:    44/hour       NREM pAHI: 24.9/hour  Central pAHI: 1.3/hour  Oxygen Saturation Statistics:    Oxygen Saturation (%) Mean: 95%   Minimum oxygen saturation (%):                 88%   O2 Saturation Range (%): 88 - 98%    O2 Saturation (minutes) <=88%: 0.2 min  Pulse Rate Statistics:   Pulse Mean (bpm):    68/min    Pulse Range (56 - 93/min)   IMPRESSION: OSA (obstructive sleep apnea)   RECOMMENDATION:  This home sleep test demonstrates severe obstructive sleep apnea - by number of events - with a total AHI of 31.8/hour and O2 nadir of 88%.  Snoring was detected, intermittently, in the mild to moderate range, rarely louder. Treatment with positive airway pressure is recommended. The patient will be advised to proceed with an autoPAP titration/trial at home. A laboratory attended titration study can be considered in the future for optimization of treatment settings and to improve tolerance and compliance. Alternative treatment options are limited, but may include surgical treatment with  an implantable hypoglossal nerve stimulator (in carefully selected candidates, meeting criteria).  Concomitant weight loss is recommended (where clinically appropriate). Please note, that untreated obstructive sleep apnea may carry additional perioperative morbidity. Patients with significant obstructive sleep apnea should receive perioperative PAP therapy and the surgeons and particularly the anesthesiologist should be informed of the diagnosis and the severity of the sleep disordered breathing. The patient should be cautioned not to drive, work at heights, or operate dangerous or heavy equipment when tired or sleepy. Review and reiteration of good sleep hygiene measures should be pursued with any patient. Other causes of the patient's symptoms, including circadian rhythm disturbances, an underlying mood disorder, medication effect and/or an underlying medical problem cannot be ruled out based on this test. Clinical correlation is recommended.  The patient and her referring provider will be notified of the test results. The patient will be seen in follow up in sleep clinic at [facility].  I certify that I have reviewed the raw data recording prior to the issuance of this report in accordance with the standards of the American Academy of Sleep Medicine (AASM).    INTERPRETING PHYSICIAN:   Huston Foley, MD, PhD Medical Director, [facility]) Diplomat, ABPN (Neurology and Sleep)   [facility] [address] [place], [place] [address] [phone]

## 2023-05-21 NOTE — Telephone Encounter (Signed)
-----   Message from Huston Foley sent at 05/21/2023  1:33 PM EDT ----- Patient referred by Dr. Lucia Gaskins, seen by me on 04/15/23, patient had a HST on 05/15/23.    Please call and notify the patient that the recent home sleep test showed obstructive sleep apnea in the severe range. I recommend treatment in the form of autoPAP, which means, that we don't have to bring her in for a sleep study with CPAP, but will let her start using a so called autoPAP machine at home, through a DME company (of her choice, or as per insurance requirement). The DME representative will fit the patient with a mask of choice, educate her on how to use the machine, how to put the mask on, etc. I have placed an order in the chart. Please send the order to a local DME, talk to patient, send report to referring MD. Please also reinforce the need for compliance with treatment. We will need a FU in sleep clinic for 10 weeks post-PAP set up, please arrange that with me or one of our NPs. Thanks,   Huston Foley, MD, PhD [facility])

## 2023-05-21 NOTE — Telephone Encounter (Addendum)
[name], CMA  Zott, Kennyth Arnold; Gamble, Tammy New orders have been placed for the above pt, DOB: 01/11/1971 Thanks   Zott, Stacy  Parminder Trapani, Abbe Amsterdam, CMA; Zott, Kennyth Arnold Got It Thank You

## 2023-05-21 NOTE — Telephone Encounter (Addendum)
I called pt. I advised pt that Dr. Frances Furbish reviewed their sleep study results and found that pt has sever OSA. Dr. Frances Furbish recommends that pt starts CPAP. I reviewed PAP compliance expectations with the pt. Pt is agreeable to starting a CPAP. I advised pt that an order will be sent to a DME, [facility], and [facility] will call the pt within about one week after they file with the pt's insurance. [facility] will show the pt how to use the machine, fit for masks, and troubleshoot the CPAP if needed. A follow up appt was made for insurance purposes with MM 1/3 VV. Pt verbalized understanding of results. Pt had no questions at this time but was encouraged to call back if questions arise. I have sent the order to [facility] and have received confirmation that they have received the order.

## 2023-05-21 NOTE — Addendum Note (Signed)
Addended by: Huston Foley on: 05/21/2023 01:33 PM   Modules accepted: Orders

## 2023-05-26 ENCOUNTER — Telehealth: Payer: Federal, State, Local not specified - PPO | Admitting: Neurology

## 2023-05-26 NOTE — Progress Notes (Deleted)
Patient just diagnosed with severe sleep apnea. Gave her the option to push out this appointment until she is treated.

## 2023-05-27 DIAGNOSIS — E559 Vitamin D deficiency, unspecified: Secondary | ICD-10-CM | POA: Diagnosis not present

## 2023-05-27 DIAGNOSIS — E1169 Type 2 diabetes mellitus with other specified complication: Secondary | ICD-10-CM | POA: Diagnosis not present

## 2023-05-27 DIAGNOSIS — E7800 Pure hypercholesterolemia, unspecified: Secondary | ICD-10-CM | POA: Diagnosis not present

## 2023-05-27 DIAGNOSIS — R5383 Other fatigue: Secondary | ICD-10-CM | POA: Diagnosis not present

## 2023-06-08 DIAGNOSIS — G4733 Obstructive sleep apnea (adult) (pediatric): Secondary | ICD-10-CM | POA: Diagnosis not present

## 2023-06-11 ENCOUNTER — Telehealth: Payer: Self-pay | Admitting: Neurology

## 2023-06-11 MED ORDER — AJOVY 225 MG/1.5ML ~~LOC~~ SOAJ: 225.0000 mg | SUBCUTANEOUS | 5 refills | Status: DC

## 2023-06-11 NOTE — Telephone Encounter (Signed)
I reviewed her chart.  I only saw sample orders for Ajovy.  I have sent a prescription to her pharmacy now.  If a PA is required, we will send to PA team.

## 2023-06-11 NOTE — Telephone Encounter (Signed)
Ajovy prescribed per v.o. Dr Lucia Gaskins.

## 2023-06-11 NOTE — Telephone Encounter (Signed)
Patient emailed me and asked me to prescribe Ajovy. Looks like I already did. Can you verify and see if it was approved and if so why she hasn't picked it up? Also tell her about the copay card? Thanks

## 2023-06-11 NOTE — Addendum Note (Signed)
Addended by: Bertram Savin on: 06/11/2023 02:07 PM   Modules accepted: Orders

## 2023-06-25 DIAGNOSIS — E559 Vitamin D deficiency, unspecified: Secondary | ICD-10-CM | POA: Diagnosis not present

## 2023-06-25 DIAGNOSIS — E7800 Pure hypercholesterolemia, unspecified: Secondary | ICD-10-CM | POA: Diagnosis not present

## 2023-06-25 DIAGNOSIS — E1169 Type 2 diabetes mellitus with other specified complication: Secondary | ICD-10-CM | POA: Diagnosis not present

## 2023-06-25 DIAGNOSIS — G4733 Obstructive sleep apnea (adult) (pediatric): Secondary | ICD-10-CM | POA: Diagnosis not present

## 2023-07-08 DIAGNOSIS — G4733 Obstructive sleep apnea (adult) (pediatric): Secondary | ICD-10-CM | POA: Diagnosis not present

## 2023-07-09 DIAGNOSIS — Z124 Encounter for screening for malignant neoplasm of cervix: Secondary | ICD-10-CM | POA: Diagnosis not present

## 2023-07-09 DIAGNOSIS — Z30432 Encounter for removal of intrauterine contraceptive device: Secondary | ICD-10-CM | POA: Diagnosis not present

## 2023-07-09 DIAGNOSIS — E7849 Other hyperlipidemia: Secondary | ICD-10-CM | POA: Diagnosis not present

## 2023-07-09 DIAGNOSIS — Z6841 Body Mass Index (BMI) 40.0 and over, adult: Secondary | ICD-10-CM | POA: Diagnosis not present

## 2023-07-09 DIAGNOSIS — Z01419 Encounter for gynecological examination (general) (routine) without abnormal findings: Secondary | ICD-10-CM | POA: Diagnosis not present

## 2023-07-09 DIAGNOSIS — Z01411 Encounter for gynecological examination (general) (routine) with abnormal findings: Secondary | ICD-10-CM | POA: Diagnosis not present

## 2023-07-10 LAB — NMR, LIPOPROFILE
Cholesterol, Total: 150 mg/dL (ref 100–199)
HDL Particle Number: 29.6 umol/L — ABNORMAL LOW (ref >=30.5)
HDL-C: 41 mg/dL (ref >39)
LDL Particle Number: 966 nmol/L (ref <1000)
LDL Size: 20.6 nmol (ref >20.5)
LDL-C (NIH Calc): 91 mg/dL (ref 0–99)
LP-IR Score: 60 — ABNORMAL HIGH (ref <=45)
Small LDL Particle Number: 495 nmol/L (ref <=527)
Triglycerides: 96 mg/dL (ref 0–149)

## 2023-07-14 ENCOUNTER — Ambulatory Visit (HOSPITAL_BASED_OUTPATIENT_CLINIC_OR_DEPARTMENT_OTHER): Payer: Federal, State, Local not specified - PPO | Admitting: Internal Medicine

## 2023-07-14 ENCOUNTER — Encounter (HOSPITAL_BASED_OUTPATIENT_CLINIC_OR_DEPARTMENT_OTHER): Payer: Self-pay | Admitting: Internal Medicine

## 2023-07-14 VITALS — BP 132/88 | HR 69 | Ht 71.0 in | Wt 301.9 lb

## 2023-07-14 DIAGNOSIS — E7849 Other hyperlipidemia: Secondary | ICD-10-CM | POA: Diagnosis not present

## 2023-07-14 DIAGNOSIS — M791 Myalgia, unspecified site: Secondary | ICD-10-CM | POA: Diagnosis not present

## 2023-07-14 DIAGNOSIS — T466X5D Adverse effect of antihyperlipidemic and antiarteriosclerotic drugs, subsequent encounter: Secondary | ICD-10-CM | POA: Diagnosis not present

## 2023-07-14 DIAGNOSIS — T466X5A Adverse effect of antihyperlipidemic and antiarteriosclerotic drugs, initial encounter: Secondary | ICD-10-CM

## 2023-07-14 NOTE — Patient Instructions (Signed)
Medication Instructions:  NO CHANGES  *If you need a refill on your cardiac medications before your next appointment, please call your pharmacy*   Lab Work: FASTING lab work in 1 year    Follow-Up: At [facility], you and your health needs are our priority.  As part of our continuing mission to provide you with exceptional heart care, we have created designated Provider Care Teams.  These Care Teams include your primary Cardiologist (physician) and Advanced Practice Providers (APPs -  Physician Assistants and Nurse Practitioners) who all work together to provide you with the care you need, when you need it.  We recommend signing up for the patient portal called "MyChart".  Sign up information is provided on this After Visit Summary.  MyChart is used to connect with patients for Virtual Visits (Telemedicine).  Patients are able to view lab/test results, encounter notes, upcoming appointments, etc.  Non-urgent messages can be sent to your provider as well.   To learn more about what you can do with MyChart, go to [email or web address].    Your next appointment:    12 months with Dr. Rennis Golden

## 2023-07-14 NOTE — Progress Notes (Signed)
LIPID CLINIC CONSULT NOTE  Chief Complaint:  Follow-up dyslipidemia  Primary Care Physician: Milus Height, PA  Primary Cardiologist:  None  HPI:  Aimee Hall is a 52 y.o. female who is being seen today for the evaluation of dyslipidemia at the request of [place], John Sevier, [place].  This is a pleasant 52 year old female who is kindly referred for evaluation and management of dyslipidemia.  Past medical history is significant for diabetes, reflux, multiple episodes of what sounds like vasovagal syncope and dehydration as well as asthma.  She also has a history of elevated cholesterol but has been hesitant to take statins due to a concerns about vitamin deficiencies associated with that.  Most recently her lipids show total cholesterol 257, triglycerides 141, HDL 34 and LDL of 197.  Hemoglobin A1c was 6.9.  Her PCP had recommended rosuvastatin however she had not had that filled and previously did try atorvastatin but did have side effects associated with it which were muscle spasms across her left upper chest.  She says that recently she has started up on a low carbohydrate diet but not a ketogenic diet and is losing some weight with this.  She is preferring to try to manage her lipids with dietary modification if possible.  03/19/2021  Aimee Hall returns today for follow-up.  She underwent calcium scoring which is reassuring and 0.  However given her younger age she may not yet have calcified her coronaries.  She does have diabetes on metformin however A1c is 6.0.  She is working on weight loss.  Her lipids continue to be elevated.  Total cholesterol 229, triglycerides of 70, HDL 40 and LDL 193.  Her LDL particle numbers remain high at 2430.  All LDL P is also high at 1127.  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  08/07/2021  Aimee Hall returns today for follow-up.  She has had a very good response to her Repatha with a reduction in LDL P  from 2004 and 30-1003 and 27, LDL-C is come down to 116 from 193.  Her small LDL P is come out from 1127-660 and total cholesterol down to 175 from 245.  This is somewhat of an incomplete response as expected typically more than 60% reduction.  It may be partially due to an elevated LP(a) which is borderline but still in normal range at 73.8 nmol/L.  She has lost a little weight recently but have been up to about 340 pounds.  She is trying to get back down to around 270 pounds.  She reports tolerating the Repatha well without any significant side effects.  01/20/2023  Aimee Hall is seen today in follow-up.  She tells me she recently was in [place], [place] and had a significant weight loss and felt that she was doing much better eating the foods there as far as her glycemic control.  I suspect she was also much more active.  She had been doing well on Repatha but discontinued it along with all of her other medications due to some concerns about potential side effects that she was reading online.  Not surprisingly, her cholesterol rebounded to her pretreatment levels with recent lipids showing LDL particle #2004 and 74, LDL-C of 183, HDL-C of 36 and triglycerides 89.  When she noted these lipid changes she says she has now gone back to therapy with the Repatha.  07/14/2023  Aimee Hall is seen today in follow-up.  She has had significant improvement in  her cholesterol again with LDL particle number of 966, down from 2004 and 74.  Her LDL is now 91 again down from 183.  Overall a very good response to therapy.  She is on Repatha.  PMHx:  Past Medical History:  Diagnosis Date   Asthma    Dehydration    Diabetes mellitus without complication (HCC)    GERD (gastroesophageal reflux disease)    Syncopal episodes     Past Surgical History:  Procedure Laterality Date   NO PAST SURGERIES      FAMHx:  Family History  Problem Relation Age of Onset   Syncope episode Other    Headache Neg Hx    Sleep  apnea Neg Hx     SOCHx:   reports that she has never smoked. She has never used smokeless tobacco. She reports that she does not drink alcohol and does not use drugs.  ALLERGIES:  Allergies  Allergen Reactions   Atorvastatin Other (See Comments)    Muscle cramps   Dilaudid [Hydromorphone]    Iodinated Contrast Media Hives   Shellfish Allergy     ROS: Pertinent items noted in HPI and remainder of comprehensive ROS otherwise negative.  HOME MEDS: Current Outpatient Medications on File Prior to Visit  Medication Sig Dispense Refill   Evolocumab (REPATHA SURECLICK) 140 MG/ML SOAJ INJECT 1 DOSE UNDER THE SKIN EVERY 14 DAYS. 2 mL 6   losartan (COZAAR) 25 MG tablet Take 25 mg by mouth daily.     Ubrogepant (UBRELVY) 100 MG TABS Take 1 tablet (100 mg total) by mouth every 2 (two) hours as needed. Maximum 200mg  a day.     Vitamin D, Ergocalciferol, (DRISDOL) 1.25 MG (50000 UNIT) CAPS capsule Take 50,000 Units by mouth once a week.     Current Facility-Administered Medications on File Prior to Visit  Medication Dose Route Frequency Provider Last Rate Last Admin   Fremanezumab-vfrm SOSY 225 mg  225 mg Subcutaneous Once         LABS/IMAGING: No results found for this or any previous visit (from the past 48 hour(s)). No results found.  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 Atorvastatin intolerant History of vasovagal syncope Type 2 diabetes Morbid obesity with weight loss efforts in place 0 CAC score (12/2020)  PLAN: 1.   Aimee Hall has restarted Repatha and has had excellent results with it.  Will plan to continue to reauthorize it as she should remain on therapy and is well-tolerated.  She has started on CPAP but has noted some difficulties with it.  One issue may be with her medical supply company.  She may need to try different mask for better fit and I encouraged her to consider [facility].  She is scheduled for CPAP follow-up in January.  Follow-up with  me annually or sooner as necessary.  Chrystie Nose, MD, [facility], FACP  [place]  [facility]  Medical Director of the [facility]id [facility] &  Cardiovascular Risk Reduction Clinic Diplomate of the American Board of Clinical Lipidology Attending Cardiologist  Direct Dial: [phone]  Fax: [phone]  Website:  [email or web address] Nicely Terri Malerba 07/14/2023, 9:02 AM

## 2023-08-03 ENCOUNTER — Encounter: Payer: Self-pay | Admitting: *Deleted

## 2023-08-03 NOTE — Patient Instructions (Addendum)
 Please continue using your CPAP regularly. While your insurance requires that you use CPAP at least 4 hours each night on 70% of the nights, I recommend, that you not skip any nights and use it throughout the night if you can. Getting used to CPAP and staying with the treatment long term does take time and patience and discipline. Untreated obstructive sleep apnea when it is moderate to severe can have an adverse impact on cardiovascular health and raise her risk for heart disease, arrhythmias, hypertension, congestive heart failure, stroke and diabetes. Untreated obstructive sleep apnea causes sleep disruption, nonrestorative sleep, and sleep deprivation. This can have an impact on your day to day functioning and cause daytime sleepiness and impairment of cognitive function, memory loss, mood disturbance, and problems focussing. Using CPAP regularly can improve these symptoms.  Continue Ajovy  and Ubrelvy  as prescribed   Follow up in 1 year  GENERAL HEADACHE INFORMATION:   Natural supplements: Magnesium Oxide or Magnesium Glycinate 500 mg at bed (up to 800 mg daily) Coenzyme Q10 300 mg in AM Vitamin B2- 200 mg twice a day   Add 1 supplement at a time since even natural supplements can have undesirable side effects. You can sometimes buy supplements cheaper (especially Coenzyme Q10) at www.webmailguide.co.za or at [facility].  Migraine with aura: There is increased risk for stroke in women with migraine with aura and a contraindication for the combined contraceptive pill for use by women who have migraine with aura. The risk for women with migraine without aura is lower. However other risk factors like smoking are far more likely to increase stroke risk than migraine. There is a recommendation for no smoking and for the use of OCPs without estrogen such as progestogen only pills particularly for women with migraine with aura.SABRA People who have migraine headaches with auras may be 3 times more likely to have a  stroke caused by a blood clot, compared to migraine patients who don't see auras. Women who take hormone-replacement therapy may be 30 percent more likely to suffer a clot-based stroke than women not taking medication containing estrogen. Other risk factors like smoking and high blood pressure may be  much more important.    Vitamins and herbs that show potential:   Magnesium: Magnesium (250 mg twice a day or 500 mg at bed) has a relaxant effect on smooth muscles such as blood vessels. Individuals suffering from frequent or daily headache usually have low magnesium levels which can be increase with daily supplementation of 400-750 mg. Three trials found 40-90% average headache reduction  when used as a preventative. Magnesium may help with headaches are aura, the best evidence for magnesium is for migraine with aura is its thought to stop the cortical spreading depression we believe is the pathophysiology of migraine aura.Magnesium also demonstrated the benefit in menstrually related migraine.  Magnesium is part of the messenger system in the serotonin cascade and it is a good muscle relaxant.  It is also useful for constipation which can be a side effect of other medications used to treat migraine. Good sources include nuts, whole grains, and tomatoes. Side Effects: loose stool/diarrhea  Riboflavin (vitamin B 2) 200 mg twice a day. This vitamin assists nerve cells in the production of ATP a principal energy storing molecule.  It is necessary for many chemical reactions in the body.  There have been at least 3 clinical trials of riboflavin using 400 mg per day all of which suggested that migraine frequency can be decreased.  All  3 trials showed significant improvement in over half of migraine sufferers.  The supplement is found in bread, cereal, milk, meat, and poultry.  Most Americans get more riboflavin than the recommended daily allowance, however riboflavin deficiency is not necessary for the supplements  to help prevent headache. Side effects: energizing, green urine   Coenzyme Q10: This is present in almost all cells in the body and is critical component for the conversion of energy.  Recent studies have shown that a nutritional supplement of CoQ10 can reduce the frequency of migraine attacks by improving the energy production of cells as with riboflavin.  Doses of 150 mg twice a day have been shown to be effective.   Melatonin: Increasing evidence shows correlation between melatonin secretion and headache conditions.  Melatonin supplementation has decreased headache intensity and duration.  It is widely used as a sleep aid.  Sleep is natures way of dealing with migraine.  A dose of 3 mg is recommended to start for headaches including cluster headache. Higher doses up to 15 mg has been reviewed for use in Cluster headache and have been used. The rationale behind using melatonin for cluster is that many theories regarding the cause of Cluster headache center around the disruption of the normal circadian rhythm in the brain.  This helps restore the normal circadian rhythm.   HEADACHE DIET: Foods and beverages which may trigger migraine Note that only 20% of headache patients are food sensitive. You will know if you are food sensitive if you get a headache consistently 20 minutes to 2 hours after eating a certain food. Only cut out a food if it causes headaches, otherwise you might remove foods you enjoy! What matters most for diet is to eat a well balanced healthy diet full of vegetables and low fat protein, and to not miss meals.   Chocolate, other sweets ALL cheeses except cottage and cream cheese Dairy products, yogurt, sour cream, ice cream Liver Meat extracts (Bovril, Marmite, meat tenderizers) Meats or fish which have undergone aging, fermenting, pickling or smoking. These include: Hotdogs,salami,Lox,sausage, mortadellas,smoked salmon, pepperoni, Pickled herring Pods of broad bean (English  beans, Chinese pea pods, Italian (fava) beans, lima and navy beans Ripe avocado, ripe banana Yeast extracts or active yeast preparations such as Brewer's or Fleishman's (commercial bakes goods are permitted) Tomato based foods, pizza (lasagna, etc.)   MSG (monosodium glutamate) is disguised as many things; look for these common aliases: Monopotassium glutamate Autolysed yeast Hydrolysed protein Sodium caseinate "flavorings" "all natural preservatives Nutrasweet   Avoid all other foods that convincingly provoke headaches.   Resources: The Dizzy Bluford Aid Your Headache Diet, migrainestrong.com  https://zamora-andrews.com/   Caffeine and Migraine For patients that have migraine, caffeine intake more than 3 days per week can lead to dependency and increased migraine frequency. I would recommend cutting back on your caffeine intake as best you can. The recommended amount of caffeine is 200-300 mg daily, although migraine patients may experience dependency at even lower doses. While you may notice an increase in headache temporarily, cutting back will be helpful for headaches in the long run. For more information on caffeine and migraine, visit: https://americanmigrainefoundation.org/resource-library/caffeine-and-migraine/   Headache Prevention Strategies:   1. Maintain a headache diary; learn to identify and avoid triggers.  - This can be a simple note where you log when you had a headache, associated symptoms, and medications used - There are several smartphone apps developed to help track migraines: Migraine Buddy, Migraine Monitor, Curelator N1-Headache App   Common  triggers include: Emotional triggers: Emotional/Upset family or friends Emotional/Upset occupation Business reversal/success Anticipation anxiety Crisis-serious Post-crisis periodNew job/position   Physical triggers: Vacation Day Weekend Strenuous Exercise High  Altitude Location New Move Menstrual Day Physical Illness Oversleep/Not enough sleep Weather changes Light: Photophobia or light sesnitivity treatment involves a balance between desensitization and reduction in overly strong input. Use dark polarized glasses outside, but not inside. Avoid bright or fluorescent light, but do not dim environment to the point that going into a normally lit room hurts. Consider FL-41 tint lenses, which reduce the most irritating wavelengths without blocking too much light.  These can be obtained at axonoptics.com or theraspecs.com Foods: see list above.   2. Limit use of acute treatments (over-the-counter medications, triptans, etc.) to no more than 2 days per week or 10 days per month to prevent medication overuse headache (rebound headache).     3. Follow a regular schedule (including weekends and holidays): Don't skip meals. Eat a balanced diet. 8 hours of sleep nightly. Minimize stress. Exercise 30 minutes per day. Being overweight is associated with a 5 times increased risk of chronic migraine. Keep well hydrated and drink 6-8 glasses of water per day.   4. Initiate non-pharmacologic measures at the earliest onset of your headache. Rest and quiet environment. Relax and reduce stress. Breathe2Relax is a free app that can instruct you on    some simple relaxtion and breathing techniques. Http://Dawnbuse.com is a    free website that provides teaching videos on relaxation.  Also, there are  many apps that   can be downloaded for "mindful" relaxation.  An app called YOGA NIDRA will help walk you through mindfulness. Another app called Calm can be downloaded to give you a structured mindfulness guide with daily reminders and skill development. Headspace for guided meditation Mindfulness Based Stress Reduction Online Course: www.palousemindfulness.com Cold compresses.   5. Don't wait!! Take the maximum allowable dosage of prescribed medication at the first sign of  migraine.   6. Compliance:  Take prescribed medication regularly as directed and at the first sign of a migraine.   7. Communicate:  Call your physician when problems arise, especially if your headaches change, increase in frequency/severity, or become associated with neurological symptoms (weakness, numbness, slurred speech, etc.). Proceed to emergency room if you experience new or worsening symptoms or symptoms do not resolve, if you have new neurologic symptoms or if headache is severe, or for any concerning symptom.   8. Headache/pain management therapies: Consider various complementary methods, including medication, behavioral therapy, psychological counselling, biofeedback, massage therapy, acupuncture, dry needling, and other modalities.  Such measures may reduce the need for medications. Counseling for pain management, where patients learn to function and ignore/minimize their pain, seems to work very well.   9. Recommend changing family's attention and focus away from patient's headaches. Instead, emphasize daily activities. If first question of day is 'How are your headaches/Do you have a headache today?', then patient will constantly think about headaches, thus making them worse. Goal is to re-direct attention away from headaches, toward daily activities and other distractions.   10. Helpful Websites: www.AmericanHeadacheSociety.org patenthood.ch www.headaches.org tightmarket.nl www.achenet.org

## 2023-08-03 NOTE — Progress Notes (Signed)
 PATIENT: Aimee Hall DOB: 08-14-1970  REASON FOR VISIT: follow up HISTORY FROM: patient  Virtual Visit via Telephone Note  I connected with Aimee Hall on 08/04/23 at 10:30 AM EST by telephone and verified that I am speaking with the correct person using two identifiers.   I discussed the limitations, risks, security and privacy concerns of performing an evaluation and management service by telephone and the availability of in person appointments. I also discussed with the patient that there may be a patient responsible charge related to this service. The patient expressed understanding and agreed to proceed.   History of Present Illness:  08/04/23 ALL: Aimee Hall is a 52 y.o. female here today for follow up for OSA recently started on CPAP. She was seen in consult with Dr Ines 02/2023 for migraines and referred to Dr Buck for sleep eval. HST 05/2023 showed severe obstructive sleep apnea - by number of events - with a total AHI of 31.8/hour and O2 nadir of 88%. AutoPAP advised. Since, she continues to adjust to therapy. She does not love using CPAP. She reports fighting with the mask. She is a side sleeper. She has a hard time sleeping on her back. She is using dreamwear stule FFM. She feels that air leaks in her eye during the night. She did not have a great experience with DME when being set up.   She continues Ajovy  and Ubrelvy  for migraine management. She reports morning headaches are improved. She has a migraine about 4 times a month. Previously 8-12.     History (copied from Dr Obie previous note)  Dear Aimee Hall,   I saw your patient, Aimee Hall, upon your kind request and my sleep clinic today for initial consultation of her sleep disorder, in particular, concern for underlying obstructive sleep apnea.  The patient is unaccompanied today.  As you know, Aimee Hall is a 52 year old female with an underlying medical history of asthma, diabetes, reflux disease, history of  syncope, migraine headaches, and severe obesity with a BMI of over 40, who reports some snoring and excessive daytime somnolence as well as morning headaches.  She had a recent overnight pulse oximetry test which showed repeated oxygen desaturations at night.  Her Epworth sleepiness score is 11 out of 24, fatigue severity score is 55 out of 63.  She had an overnight pulse oximetry test on room air on 02/23/2023.  Total duration of test time was 8 hours and 32 minutes, average oxygen saturation 94.9%, nadir was 78% with time below or at 88% saturation of nearly 14 minutes, 13 minutes and 54 seconds.  She wakes up with a headache often.  She has significant seasonal allergies and also history of asthma for which she has a rescue inhaler. I reviewed your office visit note from 02/16/2023.  She is not aware of any family history of sleep apnea.  She lives with her youngest daughter.  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  Bedtime is generally between 10 and 10:30 PM and rise time at 5:45 AM.  She has nocturia about twice per average night.  She drinks limited caffeine, 1 cup of coffee in the morning and is working on eliminating caffeine altogether.  She is a non-smoker and does not drink alcohol.   Observations/Objective:  Generalized: Well developed, in no acute distress  Mentation: Alert oriented to time, place, history taking. Follows all commands speech and language fluent  Assessment and Plan:  52 y.o. year old female  has a past medical history of Asthma, Dehydration, Diabetes mellitus without complication (HCC), GERD (gastroesophageal reflux disease), and Syncopal episodes. here with    ICD-10-CM   1. OSA on CPAP  G47.33     2. Chronic migraine without aura, with intractable migraine, so stated, with status migrainosus  G43.711       Aimee Hall continues to adjust to therapy. Compliance report shows excellent compliance. She  was encouraged to reach out to DME for any mask related concerns. She was encouraged to continue CPAP nightly for at least 4 hours. Migraines are improved. Healthy lifestyle habits encouraged. She will follow up in 1 year.   No orders of the defined types were placed in this encounter.   No orders of the defined types were placed in this encounter.    Follow Up Instructions:  I discussed the assessment and treatment plan with the patient. The patient was provided an opportunity to ask questions and all were answered. The patient agreed with the plan and demonstrated an understanding of the instructions.   The patient was advised to call back or seek an in-person evaluation if the symptoms worsen or if the condition fails to improve as anticipated.  I provided 15 minutes of non-face-to-face time during this encounter. Patient located at their place of residence during Mychart visit. Provider is in the office.    Aimee Ackroyd, NP

## 2023-08-03 NOTE — Progress Notes (Signed)
 Aimee Hall

## 2023-08-04 ENCOUNTER — Telehealth (INDEPENDENT_AMBULATORY_CARE_PROVIDER_SITE_OTHER): Payer: Federal, State, Local not specified - PPO | Admitting: Family Medicine

## 2023-08-04 ENCOUNTER — Encounter: Payer: Self-pay | Admitting: Family Medicine

## 2023-08-04 DIAGNOSIS — G4733 Obstructive sleep apnea (adult) (pediatric): Secondary | ICD-10-CM

## 2023-08-04 DIAGNOSIS — G43711 Chronic migraine without aura, intractable, with status migrainosus: Secondary | ICD-10-CM | POA: Diagnosis not present

## 2023-08-04 NOTE — Telephone Encounter (Signed)
Copy placed in chart.

## 2023-08-06 DIAGNOSIS — E7800 Pure hypercholesterolemia, unspecified: Secondary | ICD-10-CM | POA: Diagnosis not present

## 2023-08-06 DIAGNOSIS — E559 Vitamin D deficiency, unspecified: Secondary | ICD-10-CM | POA: Diagnosis not present

## 2023-08-06 DIAGNOSIS — G4733 Obstructive sleep apnea (adult) (pediatric): Secondary | ICD-10-CM | POA: Diagnosis not present

## 2023-08-06 DIAGNOSIS — E1169 Type 2 diabetes mellitus with other specified complication: Secondary | ICD-10-CM | POA: Diagnosis not present

## 2023-08-07 ENCOUNTER — Telehealth: Payer: Federal, State, Local not specified - PPO | Admitting: Adult Health

## 2023-08-08 DIAGNOSIS — G4733 Obstructive sleep apnea (adult) (pediatric): Secondary | ICD-10-CM | POA: Diagnosis not present

## 2023-08-10 ENCOUNTER — Telehealth: Payer: Self-pay

## 2023-08-10 ENCOUNTER — Encounter: Payer: Self-pay | Admitting: Family Medicine

## 2023-08-10 NOTE — Telephone Encounter (Signed)
 Faxed off pt's last appointment notes, and compliance report to [facility] [phone] on 08/10/2023.

## 2023-08-12 DIAGNOSIS — G4733 Obstructive sleep apnea (adult) (pediatric): Secondary | ICD-10-CM | POA: Diagnosis not present

## 2023-09-03 ENCOUNTER — Other Ambulatory Visit (HOSPITAL_BASED_OUTPATIENT_CLINIC_OR_DEPARTMENT_OTHER): Payer: Self-pay

## 2023-09-03 DIAGNOSIS — E1169 Type 2 diabetes mellitus with other specified complication: Secondary | ICD-10-CM | POA: Diagnosis not present

## 2023-09-03 DIAGNOSIS — E7800 Pure hypercholesterolemia, unspecified: Secondary | ICD-10-CM | POA: Diagnosis not present

## 2023-09-03 DIAGNOSIS — E559 Vitamin D deficiency, unspecified: Secondary | ICD-10-CM | POA: Diagnosis not present

## 2023-09-03 DIAGNOSIS — Z79899 Other long term (current) drug therapy: Secondary | ICD-10-CM | POA: Diagnosis not present

## 2023-09-03 DIAGNOSIS — Z6841 Body Mass Index (BMI) 40.0 and over, adult: Secondary | ICD-10-CM | POA: Diagnosis not present

## 2023-09-03 DIAGNOSIS — G4733 Obstructive sleep apnea (adult) (pediatric): Secondary | ICD-10-CM | POA: Diagnosis not present

## 2023-09-03 MED ORDER — MOUNJARO 2.5 MG/0.5ML ~~LOC~~ SOAJ
2.5000 mg | SUBCUTANEOUS | 1 refills | Status: DC
  Filled 2023-09-03: qty 2, 28d supply, fill #0
  Filled 2023-09-26: qty 2, 28d supply, fill #1

## 2023-09-08 DIAGNOSIS — G4733 Obstructive sleep apnea (adult) (pediatric): Secondary | ICD-10-CM | POA: Diagnosis not present

## 2023-09-10 DIAGNOSIS — J988 Other specified respiratory disorders: Secondary | ICD-10-CM | POA: Diagnosis not present

## 2023-09-10 DIAGNOSIS — R062 Wheezing: Secondary | ICD-10-CM | POA: Diagnosis not present

## 2023-09-10 DIAGNOSIS — J45909 Unspecified asthma, uncomplicated: Secondary | ICD-10-CM | POA: Diagnosis not present

## 2023-09-10 DIAGNOSIS — E1169 Type 2 diabetes mellitus with other specified complication: Secondary | ICD-10-CM | POA: Diagnosis not present

## 2023-09-21 ENCOUNTER — Other Ambulatory Visit: Payer: Self-pay

## 2023-09-21 ENCOUNTER — Other Ambulatory Visit (HOSPITAL_BASED_OUTPATIENT_CLINIC_OR_DEPARTMENT_OTHER): Payer: Self-pay

## 2023-09-21 MED ORDER — FREESTYLE LIBRE 14 DAY SENSOR MISC
3 refills | Status: DC
  Filled 2023-09-21 – 2023-09-22 (×2): qty 2, 28d supply, fill #0
  Filled 2023-10-15 – 2023-10-16 (×2): qty 2, 28d supply, fill #1
  Filled 2023-11-11: qty 2, 28d supply, fill #2
  Filled 2023-12-09: qty 2, 28d supply, fill #3

## 2023-09-22 ENCOUNTER — Other Ambulatory Visit (HOSPITAL_COMMUNITY): Payer: Self-pay

## 2023-09-22 ENCOUNTER — Other Ambulatory Visit: Payer: Self-pay

## 2023-09-22 ENCOUNTER — Other Ambulatory Visit (HOSPITAL_BASED_OUTPATIENT_CLINIC_OR_DEPARTMENT_OTHER): Payer: Self-pay

## 2023-09-22 ENCOUNTER — Encounter: Payer: Self-pay | Admitting: Neurology

## 2023-09-22 DIAGNOSIS — G4733 Obstructive sleep apnea (adult) (pediatric): Secondary | ICD-10-CM

## 2023-09-22 DIAGNOSIS — Z789 Other specified health status: Secondary | ICD-10-CM

## 2023-09-22 MED FILL — Evolocumab Subcutaneous Soln Auto-Injector 140 MG/ML: SUBCUTANEOUS | 28 days supply | Qty: 2 | Fill #0 | Status: AC

## 2023-09-23 ENCOUNTER — Other Ambulatory Visit (HOSPITAL_BASED_OUTPATIENT_CLINIC_OR_DEPARTMENT_OTHER): Payer: Self-pay

## 2023-09-23 NOTE — Telephone Encounter (Signed)
 Order sent to [facility]. Pressure changed in [facility] to 4 to 10 instead of 7 to 13.

## 2023-09-23 NOTE — Telephone Encounter (Signed)
 See MyChart response from me.  Please change pressure and/or send order to her DME provider.

## 2023-09-24 DIAGNOSIS — G4733 Obstructive sleep apnea (adult) (pediatric): Secondary | ICD-10-CM | POA: Diagnosis not present

## 2023-09-24 NOTE — Telephone Encounter (Signed)
 Aimee Hall, Aimee Hall, Aimee Jefferson, RN; [place], [place] Got It thank Bonita Quin

## 2023-10-01 ENCOUNTER — Other Ambulatory Visit (HOSPITAL_BASED_OUTPATIENT_CLINIC_OR_DEPARTMENT_OTHER): Payer: Self-pay

## 2023-10-01 DIAGNOSIS — E559 Vitamin D deficiency, unspecified: Secondary | ICD-10-CM | POA: Diagnosis not present

## 2023-10-01 DIAGNOSIS — E1169 Type 2 diabetes mellitus with other specified complication: Secondary | ICD-10-CM | POA: Diagnosis not present

## 2023-10-01 DIAGNOSIS — E7800 Pure hypercholesterolemia, unspecified: Secondary | ICD-10-CM | POA: Diagnosis not present

## 2023-10-01 DIAGNOSIS — K5909 Other constipation: Secondary | ICD-10-CM | POA: Diagnosis not present

## 2023-10-01 MED ORDER — MOUNJARO 2.5 MG/0.5ML ~~LOC~~ SOAJ
2.5000 mg | SUBCUTANEOUS | 1 refills | Status: DC
Start: 2023-10-01 — End: 2024-01-12
  Filled 2023-10-12 – 2023-10-21 (×4): qty 2, 28d supply, fill #0
  Filled 2023-12-06: qty 2, 28d supply, fill #1

## 2023-10-02 DIAGNOSIS — E7849 Other hyperlipidemia: Secondary | ICD-10-CM | POA: Diagnosis not present

## 2023-10-02 DIAGNOSIS — Z0000 Encounter for general adult medical examination without abnormal findings: Secondary | ICD-10-CM | POA: Diagnosis not present

## 2023-10-02 DIAGNOSIS — G43909 Migraine, unspecified, not intractable, without status migrainosus: Secondary | ICD-10-CM | POA: Diagnosis not present

## 2023-10-02 DIAGNOSIS — E1169 Type 2 diabetes mellitus with other specified complication: Secondary | ICD-10-CM | POA: Diagnosis not present

## 2023-10-02 DIAGNOSIS — F419 Anxiety disorder, unspecified: Secondary | ICD-10-CM | POA: Diagnosis not present

## 2023-10-02 DIAGNOSIS — Z111 Encounter for screening for respiratory tuberculosis: Secondary | ICD-10-CM | POA: Diagnosis not present

## 2023-10-06 DIAGNOSIS — G4733 Obstructive sleep apnea (adult) (pediatric): Secondary | ICD-10-CM | POA: Diagnosis not present

## 2023-10-13 ENCOUNTER — Other Ambulatory Visit (HOSPITAL_BASED_OUTPATIENT_CLINIC_OR_DEPARTMENT_OTHER): Payer: Self-pay

## 2023-10-13 MED FILL — Evolocumab Subcutaneous Soln Auto-Injector 140 MG/ML: SUBCUTANEOUS | 28 days supply | Qty: 2 | Fill #1 | Status: AC

## 2023-10-15 ENCOUNTER — Other Ambulatory Visit (HOSPITAL_BASED_OUTPATIENT_CLINIC_OR_DEPARTMENT_OTHER): Payer: Self-pay

## 2023-10-21 ENCOUNTER — Other Ambulatory Visit (HOSPITAL_BASED_OUTPATIENT_CLINIC_OR_DEPARTMENT_OTHER): Payer: Self-pay

## 2023-10-21 ENCOUNTER — Other Ambulatory Visit: Payer: Self-pay

## 2023-10-29 ENCOUNTER — Other Ambulatory Visit (HOSPITAL_BASED_OUTPATIENT_CLINIC_OR_DEPARTMENT_OTHER): Payer: Self-pay

## 2023-10-29 MED ORDER — EPINEPHRINE 0.3 MG/0.3ML IJ SOAJ
0.3000 mg | INTRAMUSCULAR | 1 refills | Status: AC | PRN
  Filled 2023-10-29: qty 2, 2d supply, fill #0
  Filled 2024-04-03: qty 2, 2d supply, fill #1

## 2023-11-05 ENCOUNTER — Other Ambulatory Visit (HOSPITAL_BASED_OUTPATIENT_CLINIC_OR_DEPARTMENT_OTHER): Payer: Self-pay

## 2023-11-05 DIAGNOSIS — K5909 Other constipation: Secondary | ICD-10-CM | POA: Diagnosis not present

## 2023-11-05 DIAGNOSIS — E1169 Type 2 diabetes mellitus with other specified complication: Secondary | ICD-10-CM | POA: Diagnosis not present

## 2023-11-05 DIAGNOSIS — E7800 Pure hypercholesterolemia, unspecified: Secondary | ICD-10-CM | POA: Diagnosis not present

## 2023-11-05 DIAGNOSIS — E559 Vitamin D deficiency, unspecified: Secondary | ICD-10-CM | POA: Diagnosis not present

## 2023-11-05 MED ORDER — MOUNJARO 2.5 MG/0.5ML ~~LOC~~ SOAJ
2.5000 mg | SUBCUTANEOUS | 1 refills | Status: DC
  Filled 2023-11-05 – 2023-11-11 (×2): qty 2, 28d supply, fill #0

## 2023-11-06 DIAGNOSIS — G4733 Obstructive sleep apnea (adult) (pediatric): Secondary | ICD-10-CM | POA: Diagnosis not present

## 2023-11-08 MED FILL — Evolocumab Subcutaneous Soln Auto-Injector 140 MG/ML: SUBCUTANEOUS | 28 days supply | Qty: 2 | Fill #2 | Status: AC

## 2023-11-11 ENCOUNTER — Other Ambulatory Visit (HOSPITAL_BASED_OUTPATIENT_CLINIC_OR_DEPARTMENT_OTHER): Payer: Self-pay

## 2023-11-26 ENCOUNTER — Other Ambulatory Visit: Payer: Self-pay | Admitting: Physician Assistant

## 2023-11-26 DIAGNOSIS — Z1231 Encounter for screening mammogram for malignant neoplasm of breast: Secondary | ICD-10-CM

## 2023-12-01 ENCOUNTER — Other Ambulatory Visit (HOSPITAL_BASED_OUTPATIENT_CLINIC_OR_DEPARTMENT_OTHER): Payer: Self-pay

## 2023-12-01 DIAGNOSIS — M25511 Pain in right shoulder: Secondary | ICD-10-CM | POA: Diagnosis not present

## 2023-12-01 DIAGNOSIS — M259 Joint disorder, unspecified: Secondary | ICD-10-CM | POA: Diagnosis not present

## 2023-12-01 DIAGNOSIS — E119 Type 2 diabetes mellitus without complications: Secondary | ICD-10-CM | POA: Diagnosis not present

## 2023-12-01 DIAGNOSIS — M25551 Pain in right hip: Secondary | ICD-10-CM | POA: Diagnosis not present

## 2023-12-01 DIAGNOSIS — H5213 Myopia, bilateral: Secondary | ICD-10-CM | POA: Diagnosis not present

## 2023-12-01 DIAGNOSIS — H35413 Lattice degeneration of retina, bilateral: Secondary | ICD-10-CM | POA: Diagnosis not present

## 2023-12-01 DIAGNOSIS — H35033 Hypertensive retinopathy, bilateral: Secondary | ICD-10-CM | POA: Diagnosis not present

## 2023-12-01 DIAGNOSIS — N938 Other specified abnormal uterine and vaginal bleeding: Secondary | ICD-10-CM | POA: Diagnosis not present

## 2023-12-01 DIAGNOSIS — H524 Presbyopia: Secondary | ICD-10-CM | POA: Diagnosis not present

## 2023-12-01 DIAGNOSIS — H52223 Regular astigmatism, bilateral: Secondary | ICD-10-CM | POA: Diagnosis not present

## 2023-12-01 DIAGNOSIS — H59812 Chorioretinal scars after surgery for detachment, left eye: Secondary | ICD-10-CM | POA: Diagnosis not present

## 2023-12-01 MED ORDER — DICLOFENAC SODIUM 50 MG PO TBEC
50.0000 mg | DELAYED_RELEASE_TABLET | Freq: Two times a day (BID) | ORAL | 0 refills | Status: DC
  Filled 2023-12-01: qty 40, 20d supply, fill #0

## 2023-12-02 ENCOUNTER — Telehealth: Payer: Self-pay | Admitting: Family Medicine

## 2023-12-02 NOTE — Telephone Encounter (Signed)
 Pt called stating that she has had an increase in her Migraines and she would like to schedule an appt to discuss possible medication change.

## 2023-12-03 ENCOUNTER — Other Ambulatory Visit (HOSPITAL_BASED_OUTPATIENT_CLINIC_OR_DEPARTMENT_OTHER): Payer: Self-pay

## 2023-12-03 DIAGNOSIS — L732 Hidradenitis suppurativa: Secondary | ICD-10-CM | POA: Diagnosis not present

## 2023-12-03 MED ORDER — CLINDAMYCIN PHOSPHATE 1 % EX LOTN
1.0000 | TOPICAL_LOTION | Freq: Every day | CUTANEOUS | 2 refills | Status: DC
Start: 2023-12-03 — End: 2024-06-07
  Filled 2023-12-03: qty 60, 30d supply, fill #0
  Filled 2023-12-28: qty 60, 30d supply, fill #1
  Filled 2024-01-26: qty 60, 30d supply, fill #2

## 2023-12-03 MED ORDER — METFORMIN HCL ER 500 MG PO TB24
500.0000 mg | ORAL_TABLET | Freq: Every day | ORAL | 1 refills | Status: DC
  Filled 2023-12-03: qty 30, 30d supply, fill #0
  Filled 2023-12-28: qty 30, 30d supply, fill #1

## 2023-12-06 ENCOUNTER — Other Ambulatory Visit: Payer: Self-pay | Admitting: Internal Medicine

## 2023-12-06 DIAGNOSIS — G4733 Obstructive sleep apnea (adult) (pediatric): Secondary | ICD-10-CM | POA: Diagnosis not present

## 2023-12-07 ENCOUNTER — Other Ambulatory Visit (HOSPITAL_BASED_OUTPATIENT_CLINIC_OR_DEPARTMENT_OTHER): Payer: Self-pay

## 2023-12-07 ENCOUNTER — Ambulatory Visit
Admission: RE | Admit: 2023-12-07 | Discharge: 2023-12-07 | Disposition: A | Discharge status: Home / Self Care | Source: Ambulatory Visit | Attending: Physician Assistant | Admitting: Physician Assistant

## 2023-12-07 ENCOUNTER — Other Ambulatory Visit: Payer: Self-pay

## 2023-12-07 DIAGNOSIS — Z1231 Encounter for screening mammogram for malignant neoplasm of breast: Secondary | ICD-10-CM

## 2023-12-07 MED ORDER — MOUNJARO 2.5 MG/0.5ML ~~LOC~~ SOAJ
2.5000 mg | SUBCUTANEOUS | 1 refills | Status: DC
  Filled 2023-12-07: qty 2, 28d supply, fill #0

## 2023-12-07 MED ORDER — REPATHA SURECLICK 140 MG/ML ~~LOC~~ SOAJ
140.0000 mg | SUBCUTANEOUS | 3 refills | Status: AC
  Filled 2023-12-07 – 2023-12-15 (×4): qty 6, 84d supply, fill #0
  Filled 2024-02-29: qty 6, 84d supply, fill #1
  Filled 2024-05-21: qty 6, 84d supply, fill #2
  Filled 2024-08-16: qty 6, 84d supply, fill #3

## 2023-12-08 ENCOUNTER — Other Ambulatory Visit (HOSPITAL_BASED_OUTPATIENT_CLINIC_OR_DEPARTMENT_OTHER): Payer: Self-pay

## 2023-12-08 DIAGNOSIS — M25551 Pain in right hip: Secondary | ICD-10-CM | POA: Diagnosis not present

## 2023-12-08 DIAGNOSIS — M7521 Bicipital tendinitis, right shoulder: Secondary | ICD-10-CM | POA: Diagnosis not present

## 2023-12-08 MED ORDER — MELOXICAM 15 MG PO TABS
15.0000 mg | ORAL_TABLET | Freq: Every day | ORAL | 0 refills | Status: DC
  Filled 2023-12-08: qty 10, 10d supply, fill #0

## 2023-12-08 NOTE — Progress Notes (Unsigned)
 No chief complaint on file.  Aimee Hall: headaches Aimee Hall: OSA on CPAP  HISTORY OF PRESENT ILLNESS:  12/08/23 ALL:  Aimee Hall is a 53 y.o. female here today for follow up for OSA on CPAP and migraines. She was last seen 07/2023 and continued to adjust to CPAP therapy. She had continued Ajovy  and Ubrelvy  and felt migraines had improved. She called 09/2023 requesting lower pressure range and Aimee Aimee Hall adjusted to 4-10cmH20.   Since,   LDL was 91 07/2023, down from 183 01/2023.   08/04/23 ALL: Aimee Hall is a 53 y.o. female here today for follow up for OSA recently started on CPAP. She was seen in consult with Aimee Aimee Hall 02/2023 for migraines and referred to Aimee Aimee Hall for sleep eval. HST 05/2023 showed severe obstructive sleep apnea - by number of events - with a total AHI of 31.8/hour and O2 nadir of 88%. AutoPAP advised. Since, she continues to adjust to therapy. She does not love using CPAP. She reports fighting with the mask. She is a side sleeper. She has a hard time sleeping on her back. She is using dreamwear stule FFM. She feels that air leaks in her eye during the night. She did not have a great experience with DME when being set up.   She continues Ajovy  and Ubrelvy  for migraine management. She reports morning headaches are improved. She has a migraine about 4 times a month. Previously 8-12.     History (copied from Aimee Hall previous note)  Dear Aimee Hall,   I saw your patient, Aimee Hall, upon your kind request and my sleep clinic today for initial consultation of her sleep disorder, in particular, concern for underlying obstructive sleep apnea.  The patient is unaccompanied today.  As you know, Aimee Hall is a 53 year old female with an underlying medical history of asthma, diabetes, reflux disease, history of syncope, migraine headaches, and severe obesity with a BMI of over 40, who reports some snoring and excessive daytime somnolence as well as morning headaches.  She had a  recent overnight pulse oximetry test which showed repeated oxygen desaturations at night.  Her Epworth sleepiness score is 11 out of 24, fatigue severity score is 55 out of 63.  She had an overnight pulse oximetry test on room air on 02/23/2023.  Total duration of test time was 8 hours and 32 minutes, average oxygen saturation 94.9%, nadir was 78% with time below or at 88% saturation of nearly 14 minutes, 13 minutes and 54 seconds.  She wakes up with a headache often.  She has significant seasonal allergies and also history of asthma for which she has a rescue inhaler. I reviewed your office visit note from 02/16/2023.  She is not aware of any family history of sleep apnea.  She lives with her youngest daughter.  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  Bedtime is generally between 10 and 10:30 PM and rise time at 5:45 AM.  She has nocturia about twice per average night.  She drinks limited caffeine, 1 cup of coffee in the morning and is working on eliminating caffeine altogether.  She is a non-smoker and does not drink alcohol.   REVIEW OF SYSTEMS: Out of a complete 14 system review of symptoms, the patient complains only of the following symptoms, and all other reviewed systems are negative.   ALLERGIES: Allergies  Allergen Reactions   Atorvastatin Other (See Comments)  Muscle cramps   Dilaudid  [Hydromorphone ]    Iodinated Contrast Media Hives   Shellfish Allergy      HOME MEDICATIONS: Outpatient Medications Prior to Visit  Medication Sig Dispense Refill   clindamycin  (CLEOCIN  T) 1 % lotion Apply 1 application externally once daily. 60 mL 2   Continuous Glucose Sensor (FREESTYLE LIBRE 14 DAY SENSOR) MISC Apply 1 sensor as directed and replace every 14 days. 2 each 3   diclofenac  (VOLTAREN ) 50 MG EC tablet Take 1 tablet (50 mg total) by mouth 2 (two) times daily with a meal. 40 tablet 0   EPINEPHrine  (EPIPEN  2-PAK) 0.3  mg/0.3 mL IJ SOAJ injection Inject 0.3 mg into the skin as needed. 2 each 1   Evolocumab  (REPATHA  SURECLICK) 140 MG/ML SOAJ Inject 140 mg into the skin every 14 (fourteen) days. 6 mL 3   losartan (COZAAR) 25 MG tablet Take 25 mg by mouth daily.     metFORMIN  (GLUCOPHAGE -XR) 500 MG 24 hr tablet Take 1 tablet (500 mg total) by mouth daily with evening meal. 30 tablet 1   tirzepatide  (MOUNJARO ) 2.5 MG/0.5ML Pen Inject 2.5 mg (1 pen) into the skin once a week. 2 mL 1   tirzepatide  (MOUNJARO ) 2.5 MG/0.5ML Pen Inject 2.5 mg into the skin once a week. 2 mL 1   tirzepatide  (MOUNJARO ) 2.5 MG/0.5ML Pen Inject 2.5 mg into the skin once a week. 2 mL 1   Ubrogepant  (UBRELVY ) 100 MG TABS Take 1 tablet (100 mg total) by mouth every 2 (two) hours as needed. Maximum 200mg  a day.     Vitamin D, Ergocalciferol, (DRISDOL) 1.25 MG (50000 UNIT) CAPS capsule Take 50,000 Units by mouth once a week.     Facility-Administered Medications Prior to Visit  Medication Dose Route Frequency Provider Last Rate Last Admin   Fremanezumab -vfrm SOSY 225 mg  225 mg Subcutaneous Once          PAST MEDICAL HISTORY: Past Medical History:  Diagnosis Date   Asthma    Dehydration    Diabetes mellitus without complication (HCC)    GERD (gastroesophageal reflux disease)    Syncopal episodes      PAST SURGICAL HISTORY: Past Surgical History:  Procedure Laterality Date   NO PAST SURGERIES       FAMILY HISTORY: Family History  Problem Relation Age of Onset   Syncope episode Other    Headache Neg Hx    Sleep apnea Neg Hx      SOCIAL HISTORY: Social History   Socioeconomic History   Marital status: Divorced    Spouse name: Not on file   Number of children: Not on file   Years of education: Not on file   Highest education level: Not on file  Occupational History   Not on file  Tobacco Use   Smoking status: Never   Smokeless tobacco: Never  Substance and Sexual Activity   Alcohol use: No   Drug use: No    Sexual activity: Not Currently  Other Topics Concern   Not on file  Social History Narrative   Not on file   Social Drivers of Health   Financial Resource Strain: Not on file  Food Insecurity: Not on file  Transportation Needs: Not on file  Physical Activity: Not on file  Stress: Not on file  Social Connections: Unknown (12/13/2021)   Received [facility]   Social Network    Social Network: Not on file  Intimate Partner Violence: Unknown (11/04/2021)  Received from [facility], [facility]   HITS    Physically Hurt: Not on file    Insult or Talk Down To: Not on file    Threaten Physical Harm: Not on file    Scream or Curse: Not on file     PHYSICAL EXAM  There were no vitals filed for this visit. There is no height or weight on file to calculate BMI.  Generalized: Well developed, in no acute distress  Cardiology: normal rate and rhythm, no murmur auscultated  Respiratory: clear to auscultation bilaterally    Neurological examination  Mentation: Alert oriented to time, place, history taking. Follows all commands speech and language fluent Cranial nerve II-XII: Pupils were equal round reactive to light. Extraocular movements were full, visual field were full on confrontational test. Facial sensation and strength were normal. Uvula tongue midline. Head turning and shoulder shrug  were normal and symmetric. Motor: The motor testing reveals 5 over 5 strength of all 4 extremities. Good symmetric motor tone is noted throughout.  Sensory: Sensory testing is intact to soft touch on all 4 extremities. No evidence of extinction is noted.  Coordination: Cerebellar testing reveals good finger-nose-finger and heel-to-shin bilaterally.  Gait and station: Gait is normal. Tandem gait is normal. Romberg is negative. No drift is seen.  Reflexes: Deep tendon reflexes are symmetric and normal bilaterally.    DIAGNOSTIC DATA (LABS, IMAGING, TESTING) - I reviewed  patient records, labs, notes, testing and imaging myself where available.  Lab Results  Component Value Date   WBC 9.7 12/19/2016   HGB 12.6 12/19/2016   HCT 36.7 12/19/2016   MCV 87.4 12/19/2016   PLT 270 12/19/2016      Component Value Date/Time   NA 136 12/19/2016 0025   K 3.4 (L) 12/19/2016 0025   CL 103 12/19/2016 0025   CO2 22 12/19/2016 0025   GLUCOSE 143 (H) 12/19/2016 0025   BUN 19 12/19/2016 0025   CREATININE 0.97 12/19/2016 0025   CALCIUM 8.0 (L) 12/19/2016 0025   PROT 7.0 12/19/2016 0025   ALBUMIN 3.6 12/19/2016 0025   AST 13 (L) 12/19/2016 0025   ALT 15 12/19/2016 0025   ALKPHOS 57 12/19/2016 0025   BILITOT 0.8 12/19/2016 0025   GFRNONAA >60 12/19/2016 0025   GFRAA >60 12/19/2016 0025   No results found for: "CHOL", "HDL", "LDLCALC", "LDLDIRECT", "TRIG", "CHOLHDL" No results found for: "HGBA1C" No results found for: "VITAMINB12" Lab Results  Component Value Date   TSH 1.990 02/16/2023        No data to display               No data to display           ASSESSMENT AND PLAN  53 y.o. year old female  has a past medical history of Asthma, Dehydration, Diabetes mellitus without complication (HCC), GERD (gastroesophageal reflux disease), and Syncopal episodes. here with    No diagnosis found.  Mikell A. Streiff ***.  Healthy lifestyle habits encouraged. *** will follow up with PCP as directed. *** will return to see me in ***, sooner if needed. *** verbalizes understanding and agreement with this plan.   No orders of the defined types were placed in this encounter.    No orders of the defined types were placed in this encounter.    Terrilyn Fick, MSN, FNP-C 12/08/2023, 10:45 AM  [facility] [address], [address] [place], [place] [address] [phone]

## 2023-12-08 NOTE — Patient Instructions (Incomplete)
 Below is our plan:  We will continue to monitor neuropathy symptoms. Try reducing alcohol intake.   Please continue using your CPAP regularly. While your insurance requires that you use CPAP at least 4 hours each night on 70% of the nights, I recommend, that you not skip any nights and use it throughout the night if you can. Getting used to CPAP and staying with the treatment long term does take time and patience and discipline. Untreated obstructive sleep apnea when it is moderate to severe can have an adverse impact on cardiovascular health and raise her risk for heart disease, arrhythmias, hypertension, congestive heart failure, stroke and diabetes. Untreated obstructive sleep apnea causes sleep disruption, nonrestorative sleep, and sleep deprivation. This can have an impact on your day to day functioning and cause daytime sleepiness and impairment of cognitive function, memory loss, mood disturbance, and problems focussing. Using CPAP regularly can improve these symptoms.  We will update supply orders, today.   Please make sure you are staying well hydrated. I recommend 50-60 ounces daily. Well balanced diet and regular exercise encouraged. Consistent sleep schedule with 6-8 hours recommended.   Please continue follow up with care team as directed.   Follow up with me in 1 year   You may receive a survey regarding today's visit. I encourage you to leave honest feed back as I do use this information to improve patient care. Thank you for seeing me today!

## 2023-12-09 NOTE — Progress Notes (Unsigned)
 Aimee Hall

## 2023-12-10 ENCOUNTER — Encounter: Payer: Self-pay | Admitting: Family Medicine

## 2023-12-10 ENCOUNTER — Ambulatory Visit (INDEPENDENT_AMBULATORY_CARE_PROVIDER_SITE_OTHER): Admitting: Family Medicine

## 2023-12-10 ENCOUNTER — Other Ambulatory Visit (HOSPITAL_BASED_OUTPATIENT_CLINIC_OR_DEPARTMENT_OTHER): Payer: Self-pay

## 2023-12-10 VITALS — BP 117/75 | HR 70 | Ht 70.0 in | Wt 295.0 lb

## 2023-12-10 DIAGNOSIS — E7800 Pure hypercholesterolemia, unspecified: Secondary | ICD-10-CM | POA: Diagnosis not present

## 2023-12-10 DIAGNOSIS — E559 Vitamin D deficiency, unspecified: Secondary | ICD-10-CM | POA: Diagnosis not present

## 2023-12-10 DIAGNOSIS — G4733 Obstructive sleep apnea (adult) (pediatric): Secondary | ICD-10-CM | POA: Diagnosis not present

## 2023-12-10 DIAGNOSIS — R55 Syncope and collapse: Secondary | ICD-10-CM

## 2023-12-10 DIAGNOSIS — E1169 Type 2 diabetes mellitus with other specified complication: Secondary | ICD-10-CM | POA: Diagnosis not present

## 2023-12-10 DIAGNOSIS — G43711 Chronic migraine without aura, intractable, with status migrainosus: Secondary | ICD-10-CM

## 2023-12-10 DIAGNOSIS — K5909 Other constipation: Secondary | ICD-10-CM | POA: Diagnosis not present

## 2023-12-10 MED ORDER — AJOVY 225 MG/1.5ML ~~LOC~~ SOAJ
225.0000 mg | SUBCUTANEOUS | 3 refills | Status: DC
  Filled 2023-12-10: qty 1.5, 30d supply, fill #0
  Filled 2023-12-16 – 2023-12-21 (×4): qty 4.5, 90d supply, fill #0

## 2023-12-10 MED ORDER — MOUNJARO 5 MG/0.5ML ~~LOC~~ SOAJ
5.0000 mg | SUBCUTANEOUS | 1 refills | Status: DC
  Filled 2023-12-10 – 2023-12-22 (×5): qty 6, 84d supply, fill #0
  Filled 2023-12-22 – 2023-12-23 (×2): qty 2, 28d supply, fill #0
  Filled 2024-01-06 – 2024-01-13 (×2): qty 2, 28d supply, fill #1
  Filled 2024-02-01 – 2024-02-08 (×2): qty 2, 28d supply, fill #2
  Filled 2024-02-22 – 2024-02-29 (×4): qty 2, 28d supply, fill #3
  Filled 2024-05-16: qty 2, 28d supply, fill #4

## 2023-12-10 MED ORDER — ZEPBOUND 5 MG/0.5ML ~~LOC~~ SOAJ
5.0000 mg | SUBCUTANEOUS | 1 refills | Status: DC
  Filled 2023-12-11 – 2023-12-18 (×3): qty 6, 84d supply, fill #0
  Filled 2023-12-23: qty 2, 28d supply, fill #0
  Filled 2024-01-06: qty 6, 84d supply, fill #0

## 2023-12-11 ENCOUNTER — Other Ambulatory Visit (HOSPITAL_BASED_OUTPATIENT_CLINIC_OR_DEPARTMENT_OTHER): Payer: Self-pay

## 2023-12-11 MED ORDER — ONDANSETRON 4 MG PO TBDP
4.0000 mg | ORAL_TABLET | Freq: Three times a day (TID) | ORAL | 1 refills | Status: AC | PRN
  Filled 2023-12-11: qty 20, 7d supply, fill #0

## 2023-12-13 ENCOUNTER — Other Ambulatory Visit (HOSPITAL_BASED_OUTPATIENT_CLINIC_OR_DEPARTMENT_OTHER): Payer: Self-pay

## 2023-12-13 ENCOUNTER — Other Ambulatory Visit: Payer: Self-pay

## 2023-12-14 ENCOUNTER — Other Ambulatory Visit (HOSPITAL_BASED_OUTPATIENT_CLINIC_OR_DEPARTMENT_OTHER): Payer: Self-pay

## 2023-12-14 MED ORDER — ZEPBOUND 2.5 MG/0.5ML ~~LOC~~ SOAJ
2.5000 mg | SUBCUTANEOUS | 1 refills | Status: DC
  Filled 2023-12-14 – 2024-01-01 (×3): qty 2, 28d supply, fill #0

## 2023-12-15 ENCOUNTER — Other Ambulatory Visit (HOSPITAL_BASED_OUTPATIENT_CLINIC_OR_DEPARTMENT_OTHER): Payer: Self-pay

## 2023-12-15 ENCOUNTER — Other Ambulatory Visit: Payer: Self-pay

## 2023-12-16 ENCOUNTER — Other Ambulatory Visit (HOSPITAL_BASED_OUTPATIENT_CLINIC_OR_DEPARTMENT_OTHER): Payer: Self-pay

## 2023-12-16 ENCOUNTER — Other Ambulatory Visit: Payer: Self-pay | Admitting: Sports Medicine

## 2023-12-16 DIAGNOSIS — M25551 Pain in right hip: Secondary | ICD-10-CM

## 2023-12-17 ENCOUNTER — Other Ambulatory Visit (HOSPITAL_BASED_OUTPATIENT_CLINIC_OR_DEPARTMENT_OTHER): Payer: Self-pay

## 2023-12-17 MED ORDER — FLUTICASONE PROPIONATE 50 MCG/ACT NA SUSP: 1.0000 | Freq: Every day | NASAL | 2 refills | Status: DC | PRN

## 2023-12-17 MED ORDER — NAPROXEN SODIUM 550 MG PO TABS: 550.0000 mg | ORAL_TABLET | Freq: Two times a day (BID) | ORAL | 1 refills | Status: DC

## 2023-12-17 MED ORDER — ONDANSETRON 4 MG PO TBDP: 4.0000 mg | ORAL_TABLET | Freq: Three times a day (TID) | ORAL | 3 refills | Status: DC | PRN

## 2023-12-17 MED ORDER — ERGOCALCIFEROL 1.25 MG (50000 UT) PO CAPS
50000.0000 [IU] | ORAL_CAPSULE | ORAL | 1 refills | Status: DC
  Filled 2024-01-14 – 2024-02-09 (×2): qty 12, 84d supply, fill #0

## 2023-12-17 MED ORDER — UBRELVY 100 MG PO TABS: 100.0000 mg | ORAL_TABLET | ORAL | 0 refills | Status: DC

## 2023-12-17 MED ORDER — DEXCOM G7 SENSOR MISC: 1.0000 | 3 refills | Status: DC

## 2023-12-17 MED ORDER — MOUNJARO 2.5 MG/0.5ML ~~LOC~~ SOAJ
2.5000 mg | SUBCUTANEOUS | 0 refills | Status: DC
Start: 2023-08-10 — End: 2024-01-12

## 2023-12-17 MED ORDER — ALBUTEROL SULFATE HFA 108 (90 BASE) MCG/ACT IN AERS
2.0000 | INHALATION_SPRAY | RESPIRATORY_TRACT | 2 refills | Status: AC | PRN
  Filled 2023-12-17: qty 6.7, 20d supply, fill #0
  Filled 2023-12-23 – 2024-02-22 (×2): qty 6.7, 17d supply, fill #0

## 2023-12-18 ENCOUNTER — Encounter (HOSPITAL_BASED_OUTPATIENT_CLINIC_OR_DEPARTMENT_OTHER): Payer: Self-pay | Admitting: Internal Medicine

## 2023-12-18 ENCOUNTER — Other Ambulatory Visit (HOSPITAL_BASED_OUTPATIENT_CLINIC_OR_DEPARTMENT_OTHER): Payer: Self-pay

## 2023-12-18 NOTE — Telephone Encounter (Signed)
 FYI

## 2023-12-21 ENCOUNTER — Other Ambulatory Visit (HOSPITAL_COMMUNITY): Payer: Self-pay

## 2023-12-21 ENCOUNTER — Telehealth: Payer: Self-pay | Admitting: Pharmacy Technician

## 2023-12-21 ENCOUNTER — Other Ambulatory Visit (HOSPITAL_BASED_OUTPATIENT_CLINIC_OR_DEPARTMENT_OTHER): Payer: Self-pay

## 2023-12-21 DIAGNOSIS — R399 Unspecified symptoms and signs involving the genitourinary system: Secondary | ICD-10-CM | POA: Diagnosis not present

## 2023-12-21 DIAGNOSIS — Z6841 Body Mass Index (BMI) 40.0 and over, adult: Secondary | ICD-10-CM | POA: Diagnosis not present

## 2023-12-21 NOTE — Telephone Encounter (Signed)
 Pharmacy Patient Advocate Encounter   Received notification from Patient Advice Request messages-[id number] that prior authorization for REPATHA  is required/requested.   Insurance verification completed.   The patient is insured through [facility] .   Per test claim: PA required; PA submitted to above mentioned insurance via CoverMyMeds Key/confirmation #/[facility] Status is pending    Prior Approval for Repatha  will expire 02/06/2024

## 2023-12-22 ENCOUNTER — Other Ambulatory Visit (HOSPITAL_BASED_OUTPATIENT_CLINIC_OR_DEPARTMENT_OTHER): Payer: Self-pay

## 2023-12-22 ENCOUNTER — Other Ambulatory Visit (HOSPITAL_COMMUNITY): Payer: Self-pay

## 2023-12-22 NOTE — Telephone Encounter (Signed)
 Pharmacy Patient Advocate Encounter  Received notification from [facility] that Prior Authorization for REPATHA  has been APPROVED from 11/21/23 to 12/20/24. Unable to obtain price due to refill too soon rejection, last fill date 12/15/23 next available fill date 01/10/24

## 2023-12-23 ENCOUNTER — Ambulatory Visit (HOSPITAL_BASED_OUTPATIENT_CLINIC_OR_DEPARTMENT_OTHER): Attending: Sports Medicine | Admitting: Physical Therapy

## 2023-12-23 ENCOUNTER — Other Ambulatory Visit (HOSPITAL_BASED_OUTPATIENT_CLINIC_OR_DEPARTMENT_OTHER): Payer: Self-pay

## 2023-12-23 ENCOUNTER — Other Ambulatory Visit: Payer: Self-pay

## 2023-12-23 DIAGNOSIS — M25511 Pain in right shoulder: Secondary | ICD-10-CM | POA: Insufficient documentation

## 2023-12-23 DIAGNOSIS — M25611 Stiffness of right shoulder, not elsewhere classified: Secondary | ICD-10-CM | POA: Insufficient documentation

## 2023-12-23 DIAGNOSIS — M6281 Muscle weakness (generalized): Secondary | ICD-10-CM | POA: Diagnosis not present

## 2023-12-23 NOTE — Therapy (Signed)
 OUTPATIENT PHYSICAL THERAPY SHOULDER EVALUATION   Patient Name: Aimee Hall MRN: [id number] DOB:[birth year], 53 y.o., female Today's Date: 12/24/2023  END OF SESSION:  PT End of Session - 12/23/23 1328     Visit Number 1    Number of Visits 12    Date for PT Re-Evaluation 02/03/24    Authorization Type BCBS    PT Start Time 1324    PT Stop Time 1417    PT Time Calculation (min) 53 min    Activity Tolerance Patient tolerated treatment well    Behavior During Therapy WFL for tasks assessed/performed             Past Medical History:  Diagnosis Date   Asthma    Dehydration    Diabetes mellitus without complication (HCC)    GERD (gastroesophageal reflux disease)    Syncopal episodes    Past Surgical History:  Procedure Laterality Date   NO PAST SURGERIES     Patient Active Problem List   Diagnosis Date Noted   Chronic migraine without aura, with intractable migraine, so stated, with status migrainosus 02/16/2023    PCP: Diamond Formica, PA  REFERRING PROVIDER: Rance Burrows, MD  REFERRING DIAG: M75.21 (ICD-10-CM) - Bicipital tendinitis, right shoulder  THERAPY DIAG:  Right shoulder pain, unspecified chronicity  Stiffness of right shoulder, not elsewhere classified  Muscle weakness (generalized)  Rationale for Evaluation and Treatment: Rehabilitation  ONSET DATE: Nov 2024  SUBJECTIVE:                                                                                                                                                                                      SUBJECTIVE STATEMENT: Pt reports her pain began around November 2024.  Her pain progressively worsened and is now constant.  Pt saw MD on 5/6 and had x rays.  Pt states MD informed her that her x ray looks like a normal x ray for her age.  She didn't have any bone spurs.  MD ordered PT and meloxicam .  Pt states her shoulder felt much better while taking meloxciam though the pain returned as soon as  she completed the medication.  PT order indicated R biceps tendonitis vs impingement.   Pt has disturbed sleep.  Pt has pain with dressing, driving, picking up backpack, reaching, and bathing.  Pt has pain with any movement in R UE.  Pt performs computer work and has increased pain with computer work.   Pt received massage therapy once per month.   Hand dominance: Right  PERTINENT HISTORY: Bilat hip pain DM type 2, obesity, migraine/HA's, anxiety  PAIN:  NPRS:  8/10 current, 10/10 worst, 4/10  best Location:  anterior R shoulder, biceps origin, UT Type:  dull at rest, sharp with movement.  constant  PRECAUTIONS: None    WEIGHT BEARING RESTRICTIONS: No  FALLS:  Has patient fallen in last 6 months? 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  DIAGNOSTIC FINDINGS:  Pt had x rays though PT unable to view them.   PATIENT SURVEYS:  UEFI:  24/80  COGNITION: Overall cognitive status: Within functional limits for tasks assessed      POSTURE: Pt holds arm in the "sling position" and uses her L UE for support.  PT gave pt multiple cues to relax R UE and not hold it in the "sling position".  UPPER EXTREMITY ROM:   AROM/PROM Right eval Left eval  Shoulder flexion 62/83 with pain 154  Shoulder scaption 83 with pain 150  Shoulder abduction 69 with pain 112 with pain  Shoulder adduction    Shoulder internal rotation    Shoulder external rotation 13/18 51  Elbow flexion    Elbow extension    Wrist flexion    Wrist extension    Wrist ulnar deviation    Wrist radial deviation    Wrist pronation    Wrist supination    (Blank rows = not tested)  UPPER EXTREMITY  MMT:  MMT Right eval Left eval  Shoulder flexion    Shoulder extension    Shoulder abduction    Shoulder adduction    Shoulder internal rotation    Shoulder external rotation    Middle trapezius    Lower trapezius    Elbow flexion    Elbow extension    Wrist flexion    Wrist extension    Wrist ulnar deviation    Wrist radial deviation    Wrist pronation    Wrist supination    Grip strength (lbs)    (Blank rows = not tested)  SHOULDER SPECIAL TESTS: Yergason's Test:  R: positive, L:  negative  Speed's Test:  R:  positive, L: negative Neer's Impingement:  R:  pain with limited ROM, L:  negative   PALPATION:  R anterior shoulder, coracoid process, and UT                                                                                                                             TREATMENT DATE:  Pt performed scap retractions with a 3 sec hold x 10 reps.  Pt received a HEP handout and was educated in correct form and appropriate frequency.   PATIENT EDUCATION: Education details:  PT educated pt in positioning with computer work.  POC, dx, relevant anatomy, objective  findings, HEP, rationale of interventions, and what to expect next treatment.  PT answered pt's questions.  Person educated: Patient Education method: Explanation, Demonstration, Tactile cues, Verbal cues, and Handouts Education comprehension: verbalized understanding, returned demonstration, verbal cues required, tactile cues required, and needs further education  HOME EXERCISE PROGRAM: Access Code: [email or web address] URL: [email or web address] Date: 12/23/2023 Prepared by: Marnie Siren  Exercises - Seated Scapular Retraction  - 2 x daily - 7 x weekly - 2 sets - 10 reps - 3 seconds hold  ASSESSMENT:  CLINICAL IMPRESSION: Patient is a 53 y.o. female with a dx of R shoulder bicipital tendinitis presenting to the clinic with R shoulder pain, limited R shoulder ROM, and muscle weakness in R UE.  Pt's pain  has progressively worsened and is now constant.  She has disturbed sleep.  Pt has pain with ADL's, IADL's, reaching, and work activities.  She has pain with shoulder ROM testing and is significantly limited with ROM t/o R shoulder.  Pt had positive tests for bicipital tendinitis.  She should benefit from skilled PT to improve ROM, pain, and strength and to assist with returning to PLOF.    OBJECTIVE IMPAIRMENTS: decreased activity tolerance, decreased ROM, decreased strength, hypomobility, increased muscle spasms, impaired flexibility, impaired UE functional use, and pain.   ACTIVITY LIMITATIONS: carrying, lifting, sleeping, bathing, dressing, reach over head, and hygiene/grooming  PARTICIPATION LIMITATIONS: cleaning, driving, shopping, community activity, and occupation  PERSONAL FACTORS: 1 comorbidity: DM type 2 are also affecting patient's functional outcome.   REHAB POTENTIAL: Good  CLINICAL DECISION MAKING: Stable/uncomplicated  EVALUATION COMPLEXITY: Low   GOALS:   SHORT TERM GOALS: Target date: 01/13/2024   Pt will be independent and compliant with HEP for improved ROM, pain, strength, and function.  Baseline: Goal status: INITIAL  2.  Pt will report at least a 25% improvement in pain and performance of ADL's.   Baseline:  Goal status: INITIAL  3.  Pt will be able to drive without increased pain.  Baseline:  Goal status: INITIAL Target date:  01/20/2024  4.  Pt will demo at least a 30 deg increase in flexion, abd, and scaption AROM and a 25 deg increase in ER AROM for improved reaching and performance of daily activities.  Baseline:  Goal status: INITIAL    LONG TERM GOALS: Target date: 02/03/2024  Pt will be able to reach overhead into a cabinet without difficulty and significant pain.  Baseline:  Goal status: INITIAL  2.  Pt will be able to perform her work activities without significant pain.  Baseline:  Goal status: INITIAL  3.  Pt will be able to perform  self care activities/ADL's without difficulty and significant pain.   Baseline:  Goal status: INITIAL  4.  Pt will be able to perform her normal reaching activities without significant pain Baseline:  Goal status: INITIAL  5.  Pt will demo R shoulder AROM to be [facility] t/o for performance of ADLs and IADLs.  Baseline:  Goal status: INITIAL    PLAN:  PT FREQUENCY: 2x/week  PT DURATION: 6 weeks  PLANNED INTERVENTIONS: [id number]- PT Re-evaluation, 97750- Physical Performance Testing, 97110-Therapeutic exercises, 97530- Therapeutic activity, [id number]- Neuromuscular re-education, 97535- Self Care, [id number]- Manual therapy, [id number]- Aquatic Therapy, [id number]- Electrical stimulation (unattended), [id number]- Electrical stimulation (manual), 97035- Ultrasound, Patient/Family education, Taping, Dry Needling, Joint mobilization, Spinal mobilization, Cryotherapy, and Moist heat  PLAN FOR NEXT SESSION: STM to UT.  Shoulder ROM.  Scapular exercises per pt tolerance.    Trina Fujita III PT,  DPT 12/24/23 9:46 PM

## 2023-12-24 ENCOUNTER — Encounter (HOSPITAL_BASED_OUTPATIENT_CLINIC_OR_DEPARTMENT_OTHER): Payer: Self-pay | Admitting: Physical Therapy

## 2023-12-31 ENCOUNTER — Ambulatory Visit
Admission: RE | Admit: 2023-12-31 | Discharge: 2023-12-31 | Disposition: A | Discharge status: Home / Self Care | Source: Ambulatory Visit | Attending: Sports Medicine | Admitting: Sports Medicine

## 2023-12-31 DIAGNOSIS — M25551 Pain in right hip: Secondary | ICD-10-CM

## 2023-12-31 MED ORDER — GADOPICLENOL 0.5 MMOL/ML IV SOLN
10.0000 mL | Freq: Once | INTRAVENOUS | Status: AC | PRN
  Administered 2023-12-31: 10 mL via INTRAVENOUS

## 2024-01-01 ENCOUNTER — Other Ambulatory Visit (HOSPITAL_BASED_OUTPATIENT_CLINIC_OR_DEPARTMENT_OTHER): Payer: Self-pay

## 2024-01-05 ENCOUNTER — Encounter: Payer: Self-pay | Admitting: Radiology

## 2024-01-06 ENCOUNTER — Other Ambulatory Visit: Payer: Self-pay | Admitting: Obstetrics and Gynecology

## 2024-01-06 ENCOUNTER — Other Ambulatory Visit (HOSPITAL_BASED_OUTPATIENT_CLINIC_OR_DEPARTMENT_OTHER): Payer: Self-pay

## 2024-01-06 DIAGNOSIS — G4733 Obstructive sleep apnea (adult) (pediatric): Secondary | ICD-10-CM | POA: Diagnosis not present

## 2024-01-06 DIAGNOSIS — N858 Other specified noninflammatory disorders of uterus: Secondary | ICD-10-CM | POA: Diagnosis not present

## 2024-01-09 ENCOUNTER — Other Ambulatory Visit (HOSPITAL_BASED_OUTPATIENT_CLINIC_OR_DEPARTMENT_OTHER): Payer: Self-pay

## 2024-01-10 NOTE — Therapy (Signed)
 OUTPATIENT PHYSICAL THERAPY SHOULDER TREATMENT   Patient Name: Aimee Hall MRN: [id number] DOB:1971-04-08, 53 y.o., female Today's Date: 01/10/2024  END OF SESSION:    Past Medical History:  Diagnosis Date   Asthma    Dehydration    Diabetes mellitus without complication (HCC)    GERD (gastroesophageal reflux disease)    Syncopal episodes    Past Surgical History:  Procedure Laterality Date   NO PAST SURGERIES     Patient Active Problem List   Diagnosis Date Noted   Chronic migraine without aura, with intractable migraine, so stated, with status migrainosus 02/16/2023    PCP: Diamond Formica, PA  REFERRING PROVIDER: Rance Burrows, MD  REFERRING DIAG: M75.21 (ICD-10-CM) - Bicipital tendinitis, right shoulder  THERAPY DIAG:  No diagnosis found.  Rationale for Evaluation and Treatment: Rehabilitation  ONSET DATE: Nov 2024  SUBJECTIVE:                                                                                                                                                                                      SUBJECTIVE STATEMENT: Pt states she had increased pain after prior Rx.  Pt has been performing her scapular retractions at home.  Pt states she is not hurting as bad today as it was last time she was here.  Pt reports it feels more inflamed on certain days.  Pt reports she has a 2/10 pain currently though can have significant increased pain with certain movements.  Pt states her shoulder "screamed" when she tried to put her backpack on yesterday.  Pt has disturbed sleep due to R shoulder and hip.  Pt states her shoulder and hip hurt at the same time if it's inflamed.  Pt states she is not "inflamed today".     reports her pain began around November 2024.  Her pain progressively worsened and is now constant.  Pt saw MD on 5/6 and had x rays.  Pt states MD informed her that her x ray looks like a normal x ray for her age.  She didn't have any bone spurs.  MD ordered  PT and meloxicam .  Pt states her shoulder felt much better while taking meloxciam though the pain returned as soon as she completed the medication.  PT order indicated R biceps tendonitis vs impingement.   Pt has disturbed sleep.  Pt has pain with dressing, driving, picking up backpack, reaching, and bathing.  Pt has pain with any movement in R UE.  Pt performs computer work and has increased pain with computer work.   Pt received massage therapy once per month.   Hand dominance: Right  PERTINENT HISTORY: Bilat hip pain  DM type 2, obesity, migraine/HA's, anxiety  PAIN:  NPRS:  2/10 current, 10/10 worst, 4/10 best Location:  anterior R shoulder, biceps origin, UT Type:  dull at rest, sharp with movement.  constant  PRECAUTIONS: None    WEIGHT BEARING RESTRICTIONS: No  FALLS:  Has patient fallen in last 6 months? 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  DIAGNOSTIC FINDINGS:  Pt had x rays though PT unable to view them.   PATIENT SURVEYS:  UEFI:  24/80  COGNITION: Overall cognitive status: Within functional limits for tasks assessed      POSTURE: Pt holds arm in the "sling position" and uses her L UE for support.  PT gave pt multiple cues to relax R UE and not hold it in the "sling position".  UPPER EXTREMITY ROM:   AROM/PROM Right eval Left eval  Shoulder flexion 62/83 with pain 154  Shoulder scaption 83 with pain 150  Shoulder abduction 69 with pain 112 with pain  Shoulder adduction    Shoulder internal rotation    Shoulder external rotation 13/18 51  Elbow flexion    Elbow extension    Wrist flexion    Wrist extension    Wrist ulnar deviation     Wrist radial deviation    Wrist pronation    Wrist supination    (Blank rows = not tested)  UPPER EXTREMITY MMT:  MMT Right eval Left eval  Shoulder flexion    Shoulder extension    Shoulder abduction    Shoulder adduction    Shoulder internal rotation    Shoulder external rotation    Middle trapezius    Lower trapezius    Elbow flexion    Elbow extension    Wrist flexion    Wrist extension    Wrist ulnar deviation    Wrist radial deviation    Wrist pronation    Wrist supination    Grip strength (lbs)    (Blank rows = not tested)  SHOULDER SPECIAL TESTS: Yergason's Test:  R: positive, L:  negative  Speed's Test:  R:  positive, L: negative Neer's Impingement:  R:  pain with limited ROM, L:  negative   PALPATION:  R anterior shoulder, coracoid process, and UT                                                                                                                             TREATMENT DATE:  Pt performed scap retractions with a 3 sec hold x 10 reps.  Pt received a HEP handout and was educated in correct form and appropriate frequency.   Reviewed current function,  HEP compliance, pain level, and response to prior Rx.  Reviewed HEP.  Seated table slides in flexion x 10 Supine flexion AAROM with wrist grasped x 10 reps and fingers clasped approx 6-7 reps  TM to UT.  Shoulder ROM.  Scapular exercises per pt tolerance. Isometric shoulder flexi Supine wand ER vs ER AROM Prone row  vs rows with T-band     ER AROM: 47 deg Flex AROM:  129 deg  PATIENT EDUCATION: Education details:  PT educated pt in positioning with computer work.  POC, dx, relevant anatomy, objective findings, HEP, rationale of interventions, and what to expect next treatment.  PT answered pt's questions.  Person educated: Patient Education method: Explanation, Demonstration, Tactile cues, Verbal cues, and Handouts Education comprehension: verbalized understanding, returned demonstration,  verbal cues required, tactile cues required, and needs further education  HOME EXERCISE PROGRAM: Access Code: [email or web address] URL: [email or web address] Date: 12/23/2023 Prepared by: Marnie Siren  Exercises - Seated Scapular Retraction  - 2 x daily - 7 x weekly - 2 sets - 10 reps - 3 seconds hold  Updated HEP:  ASSESSMENT:  CLINICAL IMPRESSION: Patient is a 53 y.o. female with a dx of R shoulder bicipital tendinitis presenting to the clinic with R shoulder pain, limited R shoulder ROM, and muscle weakness in R UE.  Pt's pain has progressively worsened and is now constant.  She has disturbed sleep.  Pt has pain with ADL's, IADL's, reaching, and work activities.  She has pain with shoulder ROM testing and is significantly limited with ROM t/o R shoulder.  Pt had positive tests for bicipital tendinitis.  She should benefit from skilled PT to improve ROM, pain, and strength and to assist with returning to PLOF.    Pt demonstrates significantly improved R shoulder AROM from 13 deg to 47 deg in ER and 62 deg to 129 deg in flexion today.  Pt states she is not "inflamed today".  Neck feels better after STM No increased shoulder pain  OBJECTIVE IMPAIRMENTS: decreased activity tolerance, decreased ROM, decreased strength, hypomobility, increased muscle spasms, impaired flexibility, impaired UE functional use, and pain.   ACTIVITY LIMITATIONS: carrying, lifting, sleeping, bathing, dressing, reach over head, and hygiene/grooming  PARTICIPATION LIMITATIONS: cleaning, driving, shopping, community activity, and occupation  PERSONAL FACTORS: 1 comorbidity: DM type 2 are also affecting patient's functional outcome.   REHAB POTENTIAL: Good  CLINICAL DECISION MAKING: Stable/uncomplicated  EVALUATION COMPLEXITY: Low   GOALS:   SHORT TERM GOALS: Target date: 01/13/2024   Pt will be independent and compliant with HEP for improved ROM, pain, strength, and function.  Baseline: Goal  status: INITIAL  2.  Pt will report at least a 25% improvement in pain and performance of ADL's.   Baseline:  Goal status: INITIAL  3.  Pt will be able to drive without increased pain.  Baseline:  Goal status: INITIAL Target date:  01/20/2024  4.  Pt will demo at least a 30 deg increase in flexion, abd, and scaption AROM and a 25 deg increase in ER AROM for improved reaching and performance of daily activities.  Baseline:  Goal status: INITIAL    LONG TERM GOALS: Target date: 02/03/2024  Pt will be able to reach overhead into a cabinet without difficulty and significant pain.  Baseline:  Goal status: INITIAL  2.  Pt will be able to perform her work activities without significant pain.  Baseline:  Goal status: INITIAL  3.  Pt will be able to perform self care activities/ADL's without difficulty  and significant pain.   Baseline:  Goal status: INITIAL  4.  Pt will be able to perform her normal reaching activities without significant pain Baseline:  Goal status: INITIAL  5.  Pt will demo R shoulder AROM to be [facility] t/o for performance of ADLs and IADLs.  Baseline:  Goal status: INITIAL    PLAN:  PT FREQUENCY: 2x/week  PT DURATION: 6 weeks  PLANNED INTERVENTIONS: 97164- PT Re-evaluation, 97750- Physical Performance Testing, 97110-Therapeutic exercises, 97530- Therapeutic activity, [id number]- Neuromuscular re-education, 97535- Self Care, 13086- Manual therapy, [id number]- Aquatic Therapy, [id number]- Electrical stimulation (unattended), [id number]- Electrical stimulation (manual), 97035- Ultrasound, Patient/Family education, Taping, Dry Needling, Joint mobilization, Spinal mobilization, Cryotherapy, and Moist heat  PLAN FOR NEXT SESSION: STM to UT.  Shoulder ROM.  Scapular exercises per pt tolerance.    Trina Fujita III PT, DPT 01/10/24 8:22 AM

## 2024-01-11 ENCOUNTER — Encounter (HOSPITAL_BASED_OUTPATIENT_CLINIC_OR_DEPARTMENT_OTHER): Payer: Self-pay | Admitting: Physical Therapy

## 2024-01-11 ENCOUNTER — Ambulatory Visit (HOSPITAL_BASED_OUTPATIENT_CLINIC_OR_DEPARTMENT_OTHER): Attending: Sports Medicine | Admitting: Physical Therapy

## 2024-01-11 DIAGNOSIS — M6281 Muscle weakness (generalized): Secondary | ICD-10-CM | POA: Insufficient documentation

## 2024-01-11 DIAGNOSIS — M25511 Pain in right shoulder: Secondary | ICD-10-CM | POA: Diagnosis not present

## 2024-01-11 DIAGNOSIS — M25611 Stiffness of right shoulder, not elsewhere classified: Secondary | ICD-10-CM | POA: Diagnosis not present

## 2024-01-12 ENCOUNTER — Other Ambulatory Visit (HOSPITAL_BASED_OUTPATIENT_CLINIC_OR_DEPARTMENT_OTHER): Payer: Self-pay

## 2024-01-12 ENCOUNTER — Encounter: Payer: Self-pay | Admitting: Allergy and Immunology

## 2024-01-12 ENCOUNTER — Ambulatory Visit: Admitting: Allergy and Immunology

## 2024-01-12 ENCOUNTER — Encounter (HOSPITAL_BASED_OUTPATIENT_CLINIC_OR_DEPARTMENT_OTHER): Payer: Self-pay | Admitting: Physical Therapy

## 2024-01-12 ENCOUNTER — Other Ambulatory Visit: Payer: Self-pay

## 2024-01-12 VITALS — BP 120/76 | HR 66 | Temp 97.6°F | Resp 20 | Ht 71.0 in | Wt 290.0 lb

## 2024-01-12 DIAGNOSIS — T7840XD Allergy, unspecified, subsequent encounter: Secondary | ICD-10-CM

## 2024-01-12 DIAGNOSIS — J4520 Mild intermittent asthma, uncomplicated: Secondary | ICD-10-CM | POA: Diagnosis not present

## 2024-01-12 DIAGNOSIS — J3089 Other allergic rhinitis: Secondary | ICD-10-CM

## 2024-01-12 DIAGNOSIS — G43909 Migraine, unspecified, not intractable, without status migrainosus: Secondary | ICD-10-CM

## 2024-01-12 DIAGNOSIS — J301 Allergic rhinitis due to pollen: Secondary | ICD-10-CM | POA: Diagnosis not present

## 2024-01-12 DIAGNOSIS — T781XXD Other adverse food reactions, not elsewhere classified, subsequent encounter: Secondary | ICD-10-CM | POA: Diagnosis not present

## 2024-01-12 DIAGNOSIS — N920 Excessive and frequent menstruation with regular cycle: Secondary | ICD-10-CM | POA: Diagnosis not present

## 2024-01-12 MED ORDER — NEFFY 2 MG/0.1ML NA SOLN: 1.0000 | NASAL | 4 refills | Status: DC

## 2024-01-12 MED ORDER — RYALTRIS 665-25 MCG/ACT NA SUSP: NASAL | 1 refills | Status: DC

## 2024-01-12 MED ORDER — OLOPATADINE HCL 0.2 % OP SOLN
OPHTHALMIC | 1 refills | Status: DC
  Filled 2024-01-12: qty 2.5, 25d supply, fill #0
  Filled 2024-02-01: qty 2.5, 25d supply, fill #1

## 2024-01-12 MED ORDER — AIRSUPRA 90-80 MCG/ACT IN AERO
2.0000 | INHALATION_SPRAY | RESPIRATORY_TRACT | 2 refills | Status: DC | PRN
  Filled 2024-01-12: qty 10.7, 17d supply, fill #0

## 2024-01-12 MED ORDER — CETIRIZINE HCL 10 MG PO TABS
10.0000 mg | ORAL_TABLET | Freq: Every day | ORAL | 1 refills | Status: AC | PRN
  Filled 2024-01-12: qty 100, 100d supply, fill #0

## 2024-01-12 NOTE — Progress Notes (Unsigned)
 [place] - [place] - [place] - [place] - [place]   Dear Noelle Redmon,  Thank you for referring Amand A. Oats to the [facility] [place]  on 01/12/2024.   Below is a summation of this patient's evaluation and recommendations.  Thank you for your referral. I will keep you informed about this patient's response to treatment.   If you have any questions please do not hesitate to contact me.   Sincerely,  Fabienne Holter, MD Allergy / Immunology [facility] [place]    ______________________________________________________________________    NEW PATIENT NOTE  Referring Provider: Diamond Formica, PA Primary Provider: Redmon, Noelle, PA Date of office visit: 01/12/2024    Subjective:   Chief Complaint:  Aimee Hall (DOB: 1971-08-04) is a 53 y.o. female who presents to the clinic on 01/12/2024 with a chief complaint of Asthma (30+ years. Has albuterol  inhaler. Uses about 3x a week.) and Allergic Rhinitis  (Since childhood. Has used zyrtec in the past. Fish/Shellfish allergy.) .     HPI: Rever presents to this clinic in evaluation of sensitivities.  Ameri has a long history of developing issues with allergic rhinoconjunctivitis for which I apparently saw her in this clinic 20 years ago.  She will develop some itchy eyes and watery eyes and nasal congestion and sneezing and used some Zyrtec and sometimes this helps.  She has some perennial symptoms but it definitely flares during the spring and fall especially if she is exposed to grass.  She does need to mow her grass.  She has a long history of asthma presenting as intermittent wheezing and coughing once again with some activity during the spring and fall.  Her use of a short acting bronchodilators about 1 time per week.  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   She has not consumed any shellfish or fish since that point in time.  She does have an injectable epinephrine  device.  She has a long history of migraine headache with a frequency of approximately twice a month.  She did see Dr. Narciso Backers, neurology, who recommended some preventative agents which she has yet to use.  She does drink 2 coffees per day.  She has sensitivity to various strong scents and fumes.  Perfumes, cooking smells, detergents, soaps, or precipitate a similar type of presentation.  Salivation quickly followed by headache located in the frontal region of her head and then nausea and sometimes vomiting and sometimes syncope.  If she can remove herself from the sense then her presentation usually does not progress but if she cannot remove herself from the send then she will progress to the point of developing syncope.  Past Medical History:  Diagnosis Date   Asthma    Dehydration    Diabetes mellitus without complication (HCC)    Eczema    GERD (gastroesophageal reflux disease)    Syncopal episodes    Urticaria     Past Surgical History:  Procedure Laterality Date   NO PAST SURGERIES      Allergies as of 01/12/2024       Reactions   Atorvastatin Other (See Comments)   Muscle cramps   Dilaudid  [hydromorphone ]    Iodinated Contrast Media Hives   Shellfish Allergy         Medication List     Airsupra 90-80 MCG/ACT Aero Generic drug: Albuterol -Budesonide Inhale 2  Inhalations into the lungs every 4 (four) hours as needed. Started by: Lakeria Starkman J Naydelin Ziegler   albuterol  108 (90 Base) MCG/ACT inhaler Commonly known as: VENTOLIN  HFA Inhale 2 puffs into the lungs every 4 (four) hours as needed for wheezing.   cetirizine 10 MG tablet Commonly known as: ZYRTEC Take 1 tablet (10 mg total) by mouth daily as needed for allergies or rhinitis. Started by: Shannia Jacuinde J Johnna Bollier   clindamycin  1 % gel Commonly known as: CLINDAGEL  Apply 1 Application topically 2 (two) times daily.    clindamycin  1 % lotion Commonly known as: CLEOCIN  T Apply 1 application externally once daily.   diclofenac  50 MG EC tablet Commonly known as: VOLTAREN  Take 1 tablet (50 mg total) by mouth 2 (two) times daily with a meal.   EPINEPHrine  0.3 mg/0.3 mL Soaj injection Commonly known as: EpiPen  2-Pak Inject 0.3 mg into the skin as needed.   Neffy 2 MG/0.1ML Soln Generic drug: EPINEPHrine  Place 1 Dose into the nose as directed. For single use only. Use same nostril if another dose is needed.   fluticasone  50 MCG/ACT nasal spray Commonly known as: FLONASE  Place 1 spray into both nostrils daily as needed.   FreeStyle Libre 14 Day Sensor Misc Apply 1 sensor as directed and replace every 14 days.   losartan 25 MG tablet Commonly known as: COZAAR Take 25 mg by mouth daily.   Mounjaro  5 MG/0.5ML Pen Generic drug: tirzepatide  Inject 5 mg into the skin once a week.   naproxen  sodium 550 MG tablet Commonly known as: ANAPROX  Take 1 tablet (550 mg total) by mouth 2 (two) times daily with a meal.   Olopatadine HCl 0.2 % Soln Instill 1 drop  into each eye once daily as needed. Started by: Billal Rollo J Emilyann Banka   ondansetron  4 MG disintegrating tablet Commonly known as: ZOFRAN -ODT Place 1 tablet (4 mg total) on the tongue and allow to dissolve every 8 (eight) hours as needed.   Repatha  SureClick 140 MG/ML Soaj Generic drug: Evolocumab  Inject 140 mg into the skin every 14 (fourteen) days.   Ubrelvy  100 MG Tabs Generic drug: Ubrogepant  Take 1 tablet (100 mg total) by mouth every 2 (two) hours as needed. Maximum 200mg  a day.   Ubrelvy  100 MG Tabs Generic drug: Ubrogepant  Take 1 tablet (100 mg total) by mouth at onset of headache. May repeat in 2 hours as needed. Max 2 in 24 hours.   Vitamin D  (Ergocalciferol ) 1.25 MG (50000 UNIT) Caps capsule Commonly known as: DRISDOL  Take 50,000 Units by mouth once a week.   ergocalciferol  1.25 MG (50000 UT) capsule Commonly known as: VITAMIN  D2 Take 1 capsule (50,000 Units total) by mouth once a week.    Review of systems negative except as noted in HPI / PMHx or noted below:  Review of Systems  Constitutional: Negative.   HENT: Negative.    Eyes: Negative.   Respiratory: Negative.    Cardiovascular: Negative.   Gastrointestinal: Negative.   Genitourinary: Negative.   Musculoskeletal: Negative.   Skin: Negative.   Neurological: Negative.   Endo/Heme/Allergies: Negative.   Psychiatric/Behavioral: Negative.      Family History  Problem Relation Age of Onset   Eczema Mother    Angioedema Mother    Allergic rhinitis Mother    Eczema Father    Allergic rhinitis Father    Allergic rhinitis Sister    Asthma Brother    Eczema Brother    Allergic rhinitis Brother    Angioedema Maternal Aunt  Allergic rhinitis Maternal Aunt    Angioedema Maternal Uncle    Allergic rhinitis Maternal Uncle    Allergic rhinitis Paternal Aunt    Allergic rhinitis Paternal Uncle    Angioedema Maternal Grandmother    Allergic rhinitis Maternal Grandmother    Angioedema Maternal Grandfather    Allergic rhinitis Maternal Grandfather    Allergic rhinitis Paternal Grandmother    Allergic rhinitis Paternal Grandfather    Syncope episode Other    Headache Neg Hx    Sleep apnea Neg Hx     Social History   Socioeconomic History   Marital status: Divorced    Spouse name: Not on file   Number of children: Not on file   Years of education: Not on file   Highest education level: Not on file  Occupational History   Not on file  Tobacco Use   Smoking status: Never   Smokeless tobacco: Never  Substance and Sexual Activity   Alcohol use: No   Drug use: No   Sexual activity: Not Currently  Other Topics Concern   Not on file  Social History Narrative   Pt lives family    Pt works    Social Drivers of Corporate investment banker Strain: Not on file  Food Insecurity: Not on file  Transportation Needs: Not on file  Physical  Activity: Not on file  Stress: Not on file  Social Connections: Unknown (12/13/2021)   Received [facility]   Social Network    Social Network: Not on file  Intimate Partner Violence: Unknown (11/04/2021)   Received from [facility], [facility]   HITS    Physically Hurt: Not on file    Insult or Talk Down To: Not on file    Threaten Physical Harm: Not on file    Scream or Curse: Not on file    Environmental and Social history  Lives in a house with a dry environment, no animals located inside the household, no carpet in the bedroom, no plastic in the bed, no plastic in the pillow, no smoking ongoing with inside household.  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  Objective:   Vitals:   01/12/24 0916  BP: 120/76  Pulse: 66  Resp: 20  Temp: 97.6 F (36.4 C)  SpO2: 97%   Height: 5\' 11"  (180.3 cm) Weight: 290 lb (131.5 kg)  Physical Exam Constitutional:      Appearance: She is not diaphoretic.  HENT:     Head: Normocephalic.     Right Ear: Tympanic membrane, ear canal and external ear normal.     Left Ear: Tympanic membrane, ear canal and external ear normal.     Nose: Nose normal. No mucosal edema or rhinorrhea.     Mouth/Throat:     Pharynx: Uvula midline. No oropharyngeal exudate.  Eyes:     Conjunctiva/sclera: Conjunctivae normal.  Neck:     Thyroid : No thyromegaly.     Trachea: Trachea normal. No tracheal tenderness or tracheal deviation.  Cardiovascular:     Rate and Rhythm: Normal rate and regular rhythm.     Heart sounds: Normal heart sounds, S1 normal and S2 normal. No murmur heard. Pulmonary:     Effort: No respiratory distress.     Breath sounds: Normal breath sounds. No stridor. No wheezing or rales.  Lymphadenopathy:     Head:     Right side of head:  No tonsillar adenopathy.     Left side of head: No tonsillar adenopathy.     Cervical: No cervical adenopathy.   Skin:    Findings: No erythema or rash.     Nails: There is no clubbing.  Neurological:     Mental Status: She is alert.     Diagnostics: Allergy skin tests were not performed.   Spirometry was performed and demonstrated an FEV1 of 2.68 @ 92 % of predicted. FEV1/FVC = 0.79   Assessment and Plan:    1. Asthma, mild intermittent, well-controlled   2. Adverse food reaction, subsequent encounter   3. Perennial allergic rhinitis   4. Seasonal allergic rhinitis due to pollen   5. Allergic reaction, subsequent encounter     Patient Instructions   1. Allergen avoidance measures - pollens, shellfish, fish  2. Minimize exposure to scents / fumes  3. Minimize caffeine consumption to decrease headache  4. Discuss with Dr. Tresia Fruit about a headache preventative  5. Treat and prevent allergic reactions involving airway and eyes:   A. Sports glasses and mask during extensive exposure  B. Ryaltris - 2 sprays each nostril 1-2 times per day (SP) C. Cetirizine 10 mg - 1 tablet 1 time per day if needed  D. Pataday - 1 drop each eye 1 time per day if needed  E. Airsupra - 2 inhalations every 4-6 hours (coupon) if needed  F. NEFFY, benadryl, MD/ER evaluation for allergic reaction  6. Blood - area 2 aeroallergen profile, shellfish IgE panel, Fish IgE panel, tryptase    7. Return to clinic in 4 weeks or earlier if problem   Fabienne Holter, MD Allergy / Immunology [facility] [place] 

## 2024-01-12 NOTE — Patient Instructions (Addendum)
  1. Allergen avoidance measures - pollens, shellfish, fish  2. Minimize exposure to scents / fumes  3. Minimize caffeine consumption to decrease headache  4. Discuss with Dr. Tresia Fruit about a headache preventative  5. Treat and prevent allergic reactions involving airway and eyes:   A. Sports glasses and mask during extensive exposure  B. Ryaltris - 2 sprays each nostril 1-2 times per day (SP) C. Cetirizine 10 mg - 1 tablet 1 time per day if needed  D. Pataday - 1 drop each eye 1 time per day if needed  E. Airsupra - 2 inhalations every 4-6 hours (coupon) if needed  F. NEFFY, benadryl, MD/ER evaluation for allergic reaction  6. Blood - area 2 aeroallergen profile, shellfish IgE panel, Fish IgE panel, tryptase    7. Return to clinic in 4 weeks or earlier if problem

## 2024-01-13 ENCOUNTER — Encounter: Payer: Self-pay | Admitting: Allergy and Immunology

## 2024-01-13 DIAGNOSIS — J45909 Unspecified asthma, uncomplicated: Secondary | ICD-10-CM | POA: Diagnosis not present

## 2024-01-13 DIAGNOSIS — G43909 Migraine, unspecified, not intractable, without status migrainosus: Secondary | ICD-10-CM | POA: Diagnosis not present

## 2024-01-13 DIAGNOSIS — J302 Other seasonal allergic rhinitis: Secondary | ICD-10-CM | POA: Diagnosis not present

## 2024-01-14 ENCOUNTER — Other Ambulatory Visit (HOSPITAL_BASED_OUTPATIENT_CLINIC_OR_DEPARTMENT_OTHER): Payer: Self-pay

## 2024-01-14 ENCOUNTER — Ambulatory Visit: Payer: Self-pay | Admitting: Allergy and Immunology

## 2024-01-14 ENCOUNTER — Other Ambulatory Visit: Payer: Self-pay

## 2024-01-14 DIAGNOSIS — E1169 Type 2 diabetes mellitus with other specified complication: Secondary | ICD-10-CM | POA: Diagnosis not present

## 2024-01-14 DIAGNOSIS — E559 Vitamin D deficiency, unspecified: Secondary | ICD-10-CM | POA: Diagnosis not present

## 2024-01-14 DIAGNOSIS — K5909 Other constipation: Secondary | ICD-10-CM | POA: Diagnosis not present

## 2024-01-14 DIAGNOSIS — E7800 Pure hypercholesterolemia, unspecified: Secondary | ICD-10-CM | POA: Diagnosis not present

## 2024-01-14 LAB — ALLERGENS W/TOTAL IGE AREA 2
Alternaria Alternata IgE: 0.26 kU/L — AB
Aspergillus Fumigatus IgE: 0.17 kU/L — AB
Bermuda Grass IgE: 0.33 kU/L — AB
Cat Dander IgE: 1.64 kU/L — AB
Cedar, Mountain IgE: 0.56 kU/L — AB
Cladosporium Herbarum IgE: 0.21 kU/L — AB
Cockroach, German IgE: 10.3 kU/L — AB
Common Silver Birch IgE: <0.1 kU/L
Cottonwood IgE: 0.41 kU/L — AB
D Farinae IgE: 16.4 kU/L — AB
D Pteronyssinus IgE: 14.3 kU/L — AB
Dog Dander IgE: 0.78 kU/L — AB
Elm, American IgE: 0.35 kU/L — AB
IgE (Immunoglobulin E), Serum: 1738 [IU]/mL — ABNORMAL HIGH (ref 6–495)
Johnson Grass IgE: 0.6 kU/L — AB
Maple/Box Elder IgE: 0.52 kU/L — AB
Mouse Urine IgE: <0.1 kU/L
Oak, White IgE: 0.44 kU/L — AB
Pecan, Hickory IgE: 0.35 kU/L — AB
Penicillium Chrysogen IgE: <0.1 kU/L
Pigweed, Rough IgE: 0.12 kU/L — AB
Ragweed, Short IgE: 0.21 kU/L — AB
Sheep Sorrel IgE Qn: <0.1 kU/L
Timothy Grass IgE: 3.55 kU/L — AB
White Mulberry IgE: <0.1 kU/L

## 2024-01-14 LAB — TRYPTASE: Tryptase: 4.3 ug/L (ref 2.2–13.2)

## 2024-01-14 LAB — ALLERGEN PROFILE, FOOD-FISH
Allergen Mackerel IgE: <0.1 kU/L
Allergen Salmon IgE: <0.1 kU/L
Allergen Trout IgE: 0.14 kU/L — AB
Allergen Walley Pike IgE: 0.1 kU/L — AB
Codfish IgE: <0.1 kU/L
Halibut IgE: 0.55 kU/L — AB
Tuna: 0.16 kU/L — AB

## 2024-01-14 LAB — SURGICAL PATHOLOGY

## 2024-01-14 LAB — ALLERGEN PROFILE, SHELLFISH
Clam IgE: 1.48 kU/L — AB
F023-IgE Crab: 10.5 kU/L — AB
F080-IgE Lobster: 9.96 kU/L — AB
F290-IgE Oyster: 0.12 kU/L — AB
Scallop IgE: 1.16 kU/L — AB
Shrimp IgE: 17.7 kU/L — AB

## 2024-01-18 ENCOUNTER — Ambulatory Visit: Admitting: Family Medicine

## 2024-01-19 ENCOUNTER — Ambulatory Visit (HOSPITAL_BASED_OUTPATIENT_CLINIC_OR_DEPARTMENT_OTHER): Admitting: Physical Therapy

## 2024-01-19 ENCOUNTER — Other Ambulatory Visit (HOSPITAL_BASED_OUTPATIENT_CLINIC_OR_DEPARTMENT_OTHER): Payer: Self-pay

## 2024-01-19 ENCOUNTER — Encounter (HOSPITAL_BASED_OUTPATIENT_CLINIC_OR_DEPARTMENT_OTHER): Payer: Self-pay | Admitting: Physical Therapy

## 2024-01-19 DIAGNOSIS — M25611 Stiffness of right shoulder, not elsewhere classified: Secondary | ICD-10-CM | POA: Diagnosis not present

## 2024-01-19 DIAGNOSIS — M25511 Pain in right shoulder: Secondary | ICD-10-CM | POA: Diagnosis not present

## 2024-01-19 DIAGNOSIS — M6281 Muscle weakness (generalized): Secondary | ICD-10-CM | POA: Diagnosis not present

## 2024-01-19 NOTE — Therapy (Signed)
 OUTPATIENT PHYSICAL THERAPY SHOULDER TREATMENT   Patient Name: Aimee Hall MRN: [id number] DOB:Aug 23, 1970, 53 y.o., female Today's Date: 01/19/2024  END OF SESSION:  PT End of Session - 01/19/24 1616     Visit Number 3    Number of Visits 12    Date for PT Re-Evaluation 02/03/24    Authorization Type BCBS    PT Start Time 1617    PT Stop Time 1655    PT Time Calculation (min) 38 min    Behavior During Therapy WFL for tasks assessed/performed           Past Medical History:  Diagnosis Date   Asthma    Dehydration    Diabetes mellitus without complication (HCC)    Eczema    GERD (gastroesophageal reflux disease)    Syncopal episodes    Urticaria    Past Surgical History:  Procedure Laterality Date   NO PAST SURGERIES     Patient Active Problem List   Diagnosis Date Noted   Chronic migraine without aura, with intractable migraine, so stated, with status migrainosus 02/16/2023    PCP: Diamond Formica, PA  REFERRING PROVIDER: Rance Burrows, MD  REFERRING DIAG: M75.21 (ICD-10-CM) - Bicipital tendinitis, right shoulder  THERAPY DIAG:  Right shoulder pain, unspecified chronicity  Stiffness of right shoulder, not elsewhere classified  Muscle weakness (generalized)  Rationale for Evaluation and Treatment: Rehabilitation  ONSET DATE: Nov 2024  SUBJECTIVE:                                                                                                                                                                                      SUBJECTIVE STATEMENT: Whatever he did with my shoulder last time was great.  It was so much better.      Pt states she had increased pain after prior Rx.  Pt has been performing her scapular retractions at home.  Pt states she is not hurting as bad today as it was last time she was here.  Pt reports it feels more inflamed on certain days, but she is not inflamed today.  Pt reports she has a 2/10 pain currently though can have  significant increased pain with certain movements.  Pt states her shoulder screamed when she tried to put her backpack on yesterday.  Pt has disturbed sleep due to R shoulder and hip.  Pt states her shoulder and hip hurt at the same time if it's inflamed.         Pt received massage therapy once per month.   Hand dominance: Right  PERTINENT HISTORY: Bilat hip pain DM type 2, obesity, migraine/HA's, anxiety  PAIN:  NPRS:  2/10 current, 10/10 worst, 4/10 best Location:  anterior R shoulder, biceps origin, UT Type:  dull at rest, sharp with movement.  constant  PRECAUTIONS: None    WEIGHT BEARING RESTRICTIONS: No  FALLS:  Has patient fallen in last 6 months? 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  DIAGNOSTIC FINDINGS:  Pt had x rays though PT unable to view them.   PATIENT SURVEYS:  UEFI:  24/80  COGNITION: Overall cognitive status: Within functional limits for tasks assessed      POSTURE: Pt holds arm in the sling position and uses her L UE for support.  PT gave pt multiple cues to relax R UE and not hold it in the sling position.  UPPER EXTREMITY ROM:   AROM/PROM Right eval Left eval Right 6/9  Shoulder flexion 62/83 with pain 154 129  Shoulder scaption 83 with pain 150   Shoulder abduction 69 with pain 112 with pain   Shoulder adduction     Shoulder internal rotation     Shoulder external rotation 13/18 51 47  Elbow flexion     Elbow extension     Wrist flexion     Wrist extension     Wrist ulnar deviation     Wrist radial deviation     Wrist pronation     Wrist supination     (Blank rows = not tested)  UPPER EXTREMITY MMT:  MMT  Right eval Left eval  Shoulder flexion    Shoulder extension    Shoulder abduction    Shoulder adduction    Shoulder internal rotation    Shoulder external rotation    Middle trapezius    Lower trapezius    Elbow flexion    Elbow extension    Wrist flexion    Wrist extension    Wrist ulnar deviation    Wrist radial deviation    Wrist pronation    Wrist supination    Grip strength (lbs)    (Blank rows = not tested)  SHOULDER SPECIAL TESTS: Yergason's Test:  R: positive, L:  negative  Speed's Test:  R:  positive, L: negative Neer's Impingement:  R:  pain with limited ROM, L:  negative   PALPATION:  R anterior shoulder, coracoid process, and UT                                                                                                                             TREATMENT DATE:   [facility] Adult PT Treatment:  DATE: 01/19/24  Therapeutic Exercise: Scap squeeze x 5 sec x 5 Low doorway stretch x 15s x 2 Midlevel doorway stretch (elbows straight)  15s x 2 Shoulder rolls  High level doorway stretch  - not tolerated Overhead doorway stretch x 10s (limited tolerance) L stretch (like table slide) with arms on elevated desk x 15s Manual Therapy: IASTM to R ant delt, biceps brachii, pec, infraspinatus;  TPR to R rhomboid, levator scapula, upper trap, infraspinatus Sensitive skin rock tape applied to R prox biceps brachii near origin and over R infraspinatus to deltoid tuberosity Self Care: Pt instructed in self massage to R upper trap with ball and door frame; returned demo with cues.   PATIENT EDUCATION: Education details:  treatment / exercise rationale and modifications   Person educated: Patient Education method: Explanation, Demonstration, Tactile cues, Verbal cues, Education comprehension: verbalized understanding, returned demonstration, verbal cues required, tactile cues required, and needs further education  HOME  EXERCISE PROGRAM: Access Code: [email or web address] URL: [email or web address] Date: 12/23/2023 Prepared by: Marnie Siren  Exercises - Seated Scapular Retraction  - 2 x daily - 7 x weekly - 2 sets - 10 reps - 3 seconds hold  Updated HEP: - Seated Shoulder Flexion Towel Slide at Table Top  - 1-2 x daily - 7 x weekly - 1-2 sets - 10 reps - Supine Shoulder Flexion AAROM   - 1 x daily - 7 x weekly - 3 sets - 10 reps - Sidelying Shoulder External Rotation  - 1 x daily - 7 x weekly - 2 sets - 10 reps - Isometric Shoulder Flexion at Wall  - 1 x daily - 5 x weekly - 2 sets - 10 reps - 5 seconds hold  ASSESSMENT:  CLINICAL IMPRESSION: Pt with high pain sensitivity with STM/IASTM to R infraspinatus.  Responded well to TPR to R upper trap and IASTM to R biceps brachii/deltoid. Tolerated low and mid-level doorway stretch with elbow straight. Encouraged pt to avoid sling position with RUE. Trial of sensitive skin rock tape applied to R shoulder to increase proprioception.   She should benefit from skilled PT to improve ROM, pain, and strength and to assist with returning to PLOF.     OBJECTIVE IMPAIRMENTS: decreased activity tolerance, decreased ROM, decreased strength, hypomobility, increased muscle spasms, impaired flexibility, impaired UE functional use, and pain.   ACTIVITY LIMITATIONS: carrying, lifting, sleeping, bathing, dressing, reach over head, and hygiene/grooming  PARTICIPATION LIMITATIONS: cleaning, driving, shopping, community activity, and occupation  PERSONAL FACTORS: 1 comorbidity: DM type 2 are also affecting patient's functional outcome.   REHAB POTENTIAL: Good  CLINICAL DECISION MAKING: Stable/uncomplicated  EVALUATION COMPLEXITY: Low   GOALS:   SHORT TERM GOALS: Target date: 01/13/2024   Pt will be independent and compliant with HEP for improved ROM, pain, strength, and function.  Baseline: Goal status: INITIAL  2.  Pt will report at least a 25% improvement in  pain and performance of ADL's.   Baseline:  Goal status: INITIAL  3.  Pt will be able to drive without increased pain.  Baseline:  Goal status: INITIAL Target date:  01/20/2024  4.  Pt will demo at least a 30 deg increase in flexion, abd, and scaption AROM and a 25 deg increase in ER AROM for improved reaching and performance of daily activities.  Baseline:  Goal status:  50% MET    LONG TERM GOALS: Target date: 02/03/2024  Pt will be able to reach overhead into a cabinet without difficulty and significant pain.  Baseline:  Goal status: INITIAL  2.  Pt will be able to perform her work activities without significant pain.  Baseline:  Goal status: INITIAL  3.  Pt will be able to perform self care activities/ADL's without difficulty and significant pain.   Baseline:  Goal status: INITIAL  4.  Pt will be able to perform her normal reaching activities without significant pain Baseline:  Goal status: INITIAL  5.  Pt will demo R shoulder AROM to be [facility] t/o for performance of ADLs and IADLs.  Baseline:  Goal status: INITIAL    PLAN:  PT FREQUENCY: 2x/week  PT DURATION: 6 weeks  PLANNED INTERVENTIONS: 97164- PT Re-evaluation, 97750- Physical Performance Testing, 97110-Therapeutic exercises, 97530- Therapeutic activity, [id number]- Neuromuscular re-education, 97535- Self Care, [id number]- Manual therapy, [id number]- Aquatic Therapy, M5784- Electrical stimulation (unattended), [id number]- Electrical stimulation (manual), 97035- Ultrasound, Patient/Family education, Taping, Dry Needling, Joint mobilization, Spinal mobilization, Cryotherapy, and Moist heat  PLAN FOR NEXT SESSION: STM to UT.  Shoulder ROM.  Scapular exercises per pt tolerance.    Almedia Jacobsen, PTA 01/19/24 6:01 PM [facility] [address] [place], [place], [address] Phone: [phone]   Fax:  [phone]

## 2024-01-21 ENCOUNTER — Encounter (HOSPITAL_BASED_OUTPATIENT_CLINIC_OR_DEPARTMENT_OTHER): Admitting: Physical Therapy

## 2024-01-26 ENCOUNTER — Encounter (HOSPITAL_BASED_OUTPATIENT_CLINIC_OR_DEPARTMENT_OTHER): Payer: Self-pay | Admitting: Physical Therapy

## 2024-01-26 ENCOUNTER — Other Ambulatory Visit (HOSPITAL_BASED_OUTPATIENT_CLINIC_OR_DEPARTMENT_OTHER): Payer: Self-pay

## 2024-01-26 ENCOUNTER — Ambulatory Visit (HOSPITAL_BASED_OUTPATIENT_CLINIC_OR_DEPARTMENT_OTHER): Admitting: Physical Therapy

## 2024-01-26 DIAGNOSIS — M25611 Stiffness of right shoulder, not elsewhere classified: Secondary | ICD-10-CM

## 2024-01-26 DIAGNOSIS — M25511 Pain in right shoulder: Secondary | ICD-10-CM

## 2024-01-26 DIAGNOSIS — M6281 Muscle weakness (generalized): Secondary | ICD-10-CM | POA: Diagnosis not present

## 2024-01-26 NOTE — Therapy (Signed)
 OUTPATIENT PHYSICAL THERAPY SHOULDER TREATMENT   Patient Name: Aimee Hall MRN: [id number] DOB:January 13, 1971, 53 y.o., female Today's Date: 01/26/2024  END OF SESSION:  PT End of Session - 01/26/24 1623     Visit Number 4    Number of Visits 12    Date for PT Re-Evaluation 02/03/24    Authorization Type BCBS    PT Start Time 1617    PT Stop Time 1657    PT Time Calculation (min) 40 min    Activity Tolerance Patient tolerated treatment well    Behavior During Therapy WFL for tasks assessed/performed           Past Medical History:  Diagnosis Date   Asthma    Dehydration    Diabetes mellitus without complication (HCC)    Eczema    GERD (gastroesophageal reflux disease)    Syncopal episodes    Urticaria    Past Surgical History:  Procedure Laterality Date   NO PAST SURGERIES     Patient Active Problem List   Diagnosis Date Noted   Chronic migraine without aura, with intractable migraine, so stated, with status migrainosus 02/16/2023    PCP: Alvera Reagin, PA  REFERRING PROVIDER: Dasie Fitch, MD  REFERRING DIAG: M75.21 (ICD-10-CM) - Bicipital tendinitis, right shoulder  THERAPY DIAG:  Right shoulder pain, unspecified chronicity  Stiffness of right shoulder, not elsewhere classified  Muscle weakness (generalized)  Rationale for Evaluation and Treatment: Rehabilitation  ONSET DATE: Nov 2024  SUBJECTIVE:                                                                                                                                                                                      SUBJECTIVE STATEMENT: Pt reports good relief with tape on shoulder.  Left tape on 6 days. No skin irritation.  Will receive monthly massage this weekend.      Pt states she had increased pain after prior Rx.  Pt has been performing her scapular retractions at home.  Pt states she is not hurting as bad today as it was last time she was here.  Pt reports it feels more  inflamed on certain days, but she is not inflamed today.  Pt reports she has a 2/10 pain currently though can have significant increased pain with certain movements.  Pt states her shoulder screamed when she tried to put her backpack on yesterday.  Pt has disturbed sleep due to R shoulder and hip.  Pt states her shoulder and hip hurt at the same time if it's inflamed.         Pt received massage therapy once per month.   Hand dominance: Right  PERTINENT HISTORY: Bilat hip pain DM type 2, obesity, migraine/HA's, anxiety  PAIN:  NPRS:  1-2/10 current Location:  anterior R shoulder, biceps origin, UT Type:  dull at rest, sharp with movement.  constant  PRECAUTIONS: None    WEIGHT BEARING RESTRICTIONS: No  FALLS:  Has patient fallen in last 6 months? 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  DIAGNOSTIC FINDINGS:  Pt had x rays though PT unable to view them.   PATIENT SURVEYS:  UEFI:  24/80  COGNITION: Overall cognitive status: Within functional limits for tasks assessed      POSTURE: Pt holds arm in the sling position and uses her L UE for support.  PT gave pt multiple cues to relax R UE and not hold it in the sling position.  UPPER EXTREMITY ROM:   AROM/PROM Right eval Left eval Right 6/9  Shoulder flexion 62/83 with pain 154 129  Shoulder scaption 83 with pain 150   Shoulder abduction 69 with pain 112 with pain   Shoulder adduction     Shoulder internal rotation     Shoulder external rotation 13/18 51 47  Elbow flexion     Elbow extension     Wrist flexion     Wrist extension     Wrist ulnar deviation     Wrist radial deviation     Wrist  pronation     Wrist supination     (Blank rows = not tested)  UPPER EXTREMITY MMT:  MMT Right eval Left eval  Shoulder flexion    Shoulder extension    Shoulder abduction    Shoulder adduction    Shoulder internal rotation    Shoulder external rotation    Middle trapezius    Lower trapezius    Elbow flexion    Elbow extension    Wrist flexion    Wrist extension    Wrist ulnar deviation    Wrist radial deviation    Wrist pronation    Wrist supination    Grip strength (lbs)    (Blank rows = not tested)  SHOULDER SPECIAL TESTS: Yergason's Test:  R: positive, L:  negative  Speed's Test:  R:  positive, L: negative Neer's Impingement:  R:  pain with limited ROM, L:  negative   PALPATION:  R anterior shoulder, coracoid process, and UT                                                                                                                             TREATMENT DATE:   [facility] Adult PT Treatment:  DATE: 01/26/24  Therapeutic Exercise: UBE, L1, forward, 1 min backward (increased pain) Low doorway stretch x 15s x 2 Midlevel doorway stretch (elbows straight)  15s x 2 Bilat shoulder ext with cane behind back x 10 Seated thoracic ext with hands behind head, x 10s x 2 Relaxed w's with scap squeeze x 5 sec  Shoulder rolls  Open book R rotation x 6 Star gazer stretch with palms on forehead -> LTR Manual Therapy: IASTM to R ant delt, biceps brachii, pec, infraspinatus;  TPR to R rhomboid, levator scapula, upper trap, infraspinatus Sensitive skin rock tape applied to R prox biceps brachii near origin and over R infraspinatus to deltoid tuberosity- perpendicular pieces to assist with decompression.    [facility] Adult PT Treatment:                                                DATE: 01/19/24  Therapeutic Exercise: Scap squeeze x 5 sec x 5 Low doorway stretch x 15s x 2 Midlevel doorway stretch (elbows straight)  15s x 2 Shoulder  rolls  High level doorway stretch  - not tolerated Overhead doorway stretch x 10s (limited tolerance) L stretch (like table slide) with arms on elevated desk x 15s Manual Therapy: IASTM to R ant delt, biceps brachii, pec, infraspinatus;  TPR to R rhomboid, levator scapula, upper trap, infraspinatus Sensitive skin rock tape applied to R prox biceps brachii near origin and over R infraspinatus to deltoid tuberosity Self Care: Pt instructed in self massage to R upper trap with ball and door frame; returned demo with cues.   PATIENT EDUCATION: Education details:  treatment / exercise rationale and modifications   Person educated: Patient Education method: Explanation, Demonstration, Tactile cues, Verbal cues, Education comprehension: verbalized understanding, returned demonstration, verbal cues required, tactile cues required, and needs further education  HOME EXERCISE PROGRAM: Access Code: [email or web address] URL: [email or web address] Date: 12/23/2023 Prepared by: Mose Minerva  Exercises - Seated Scapular Retraction  - 2 x daily - 7 x weekly - 2 sets - 10 reps - 3 seconds hold  Updated HEP: - Seated Shoulder Flexion Towel Slide at Table Top  - 1-2 x daily - 7 x weekly - 1-2 sets - 10 reps - Supine Shoulder Flexion AAROM   - 1 x daily - 7 x weekly - 3 sets - 10 reps - Sidelying Shoulder External Rotation  - 1 x daily - 7 x weekly - 2 sets - 10 reps - Isometric Shoulder Flexion at Wall  - 1 x daily - 5 x weekly - 2 sets - 10 reps - 5 seconds hold  ASSESSMENT:  CLINICAL IMPRESSION: Pt with high pain sensitivity with STM/IASTM to R infraspinatus.  Improved tolerance to IASTM to R biceps brachii/deltoid. Tolerated new stretches (open book and star gazer stretch)well, but had difficulty with transition to the positioning for exercise. Reapplied tape, as pt reported improved tolerance for R shoulder ROM with tape applied. she should benefit from skilled PT to improve ROM, pain, and  strength and to assist with returning to PLOF.     OBJECTIVE IMPAIRMENTS: decreased activity tolerance, decreased ROM, decreased strength, hypomobility, increased muscle spasms, impaired flexibility, impaired UE functional use, and pain.   ACTIVITY LIMITATIONS: carrying, lifting, sleeping, bathing, dressing, reach over head, and hygiene/grooming  PARTICIPATION LIMITATIONS: cleaning, driving, shopping, community activity, and occupation  PERSONAL FACTORS:  1 comorbidity: DM type 2 are also affecting patient's functional outcome.   REHAB POTENTIAL: Good  CLINICAL DECISION MAKING: Stable/uncomplicated  EVALUATION COMPLEXITY: Low   GOALS:   SHORT TERM GOALS: Target date: 01/13/2024   Pt will be independent and compliant with HEP for improved ROM, pain, strength, and function.  Baseline: Goal status: INITIAL  2.  Pt will report at least a 25% improvement in pain and performance of ADL's.   Baseline:  Goal status: INITIAL  3.  Pt will be able to drive without increased pain.  Baseline:  Goal status: INITIAL Target date:  01/20/2024  4.  Pt will demo at least a 30 deg increase in flexion, abd, and scaption AROM and a 25 deg increase in ER AROM for improved reaching and performance of daily activities.  Baseline:  Goal status:  50% MET    LONG TERM GOALS: Target date: 02/03/2024  Pt will be able to reach overhead into a cabinet without difficulty and significant pain.  Baseline:  Goal status: INITIAL  2.  Pt will be able to perform her work activities without significant pain.  Baseline:  Goal status: INITIAL  3.  Pt will be able to perform self care activities/ADL's without difficulty and significant pain.   Baseline:  Goal status: INITIAL  4.  Pt will be able to perform her normal reaching activities without significant pain Baseline:  Goal status: INITIAL  5.  Pt will demo R shoulder AROM to be [facility] t/o for performance of ADLs and IADLs.  Baseline:  Goal status:  INITIAL    PLAN:  PT FREQUENCY: 2x/week  PT DURATION: 6 weeks  PLANNED INTERVENTIONS: 97164- PT Re-evaluation, 97750- Physical Performance Testing, 97110-Therapeutic exercises, 97530- Therapeutic activity, [id number]- Neuromuscular re-education, 97535- Self Care, 02859- Manual therapy, [id number]- Aquatic Therapy, H9716- Electrical stimulation (unattended), [id number]- Electrical stimulation (manual), 97035- Ultrasound, Patient/Family education, Taping, Dry Needling, Joint mobilization, Spinal mobilization, Cryotherapy, and Moist heat  PLAN FOR NEXT SESSION: STM to UT.  Shoulder ROM.  Scapular exercises per pt tolerance.   Delon Aquas, PTA 01/26/24 6:03 PM [facility] [address] [place], [place], [address] Phone: [phone]   Fax:  [phone]

## 2024-01-29 ENCOUNTER — Encounter (HOSPITAL_BASED_OUTPATIENT_CLINIC_OR_DEPARTMENT_OTHER): Admitting: Physical Therapy

## 2024-02-01 ENCOUNTER — Other Ambulatory Visit (HOSPITAL_BASED_OUTPATIENT_CLINIC_OR_DEPARTMENT_OTHER): Payer: Self-pay

## 2024-02-02 DIAGNOSIS — M25551 Pain in right hip: Secondary | ICD-10-CM | POA: Diagnosis not present

## 2024-02-02 DIAGNOSIS — M25511 Pain in right shoulder: Secondary | ICD-10-CM | POA: Diagnosis not present

## 2024-02-02 DIAGNOSIS — M7912 Myalgia of auxiliary muscles, head and neck: Secondary | ICD-10-CM | POA: Diagnosis not present

## 2024-02-03 ENCOUNTER — Ambulatory Visit (HOSPITAL_BASED_OUTPATIENT_CLINIC_OR_DEPARTMENT_OTHER): Attending: Sports Medicine | Admitting: Physical Therapy

## 2024-02-03 ENCOUNTER — Other Ambulatory Visit (HOSPITAL_BASED_OUTPATIENT_CLINIC_OR_DEPARTMENT_OTHER): Payer: Self-pay | Admitting: Sports Medicine

## 2024-02-03 DIAGNOSIS — M25511 Pain in right shoulder: Secondary | ICD-10-CM

## 2024-02-03 DIAGNOSIS — M25611 Stiffness of right shoulder, not elsewhere classified: Secondary | ICD-10-CM | POA: Diagnosis not present

## 2024-02-03 DIAGNOSIS — M6281 Muscle weakness (generalized): Secondary | ICD-10-CM | POA: Diagnosis not present

## 2024-02-03 NOTE — Therapy (Signed)
 OUTPATIENT PHYSICAL THERAPY SHOULDER TREATMENT   Patient Name: Aimee Hall MRN: [id number] DOB:06/03/1971, 53 y.o., female Today's Date: 02/03/2024  END OF SESSION:  PT End of Session - 02/03/24 1600     Visit Number 5    Number of Visits 12    Date for PT Re-Evaluation 02/03/24    Authorization Type BC[employer]    PT Start Time 1600    PT Stop Time 1640    PT Time Calculation (min) 40 min           Past Medical History:  Diagnosis Date   Asthma    Dehydration    Diabetes mellitus without complication (HCC)    Eczema    GERD (gastroesophageal reflux disease)    Syncopal episodes    Urticaria    Past Surgical History:  Procedure Laterality Date   NO PAST SURGERIES     Patient Active Problem List   Diagnosis Date Noted   Chronic migraine without aura, with intractable migraine, so stated, with status migrainosus 02/16/2023    PCP: Alvera Reagin, PA  REFERRING PROVIDER: Dasie Fitch, MD  REFERRING DIAG: M75.21 (ICD-10-CM) - Bicipital tendinitis, right shoulder  THERAPY DIAG:  Right shoulder pain, unspecified chronicity  Stiffness of right shoulder, not elsewhere classified  Muscle weakness (generalized)  Rationale for Evaluation and Treatment: Rehabilitation  ONSET DATE: Nov 2024  SUBJECTIVE:                                                                                                                                                                                      SUBJECTIVE STATEMENT: Pt reports she had visit with Dr yesterday; had lidocane injection in (posterior) R shoulder. Not having the stabbing pain in there now.  She reports that dr ordered an MRI of shoulder. Pt reports 40% improvement in pain since starting therapy.     Hand dominance: Right  PERTINENT HISTORY: Bilat hip pain DM type 2, obesity, migraine/HA's, anxiety  PAIN:  NPRS:  2-3/10 current Location:  anterior R shoulder, biceps origin, UT Type:  dull at rest, sharp with  movement.  constant  PRECAUTIONS: None    WEIGHT BEARING RESTRICTIONS: No  FALLS:  Has patient fallen in last 6 months? 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  DIAGNOSTIC FINDINGS:  Pt had x rays though  PT unable to view them.   PATIENT SURVEYS:  UEFI:  24/80  COGNITION: Overall cognitive status: Within functional limits for tasks assessed      POSTURE: Pt holds arm in the sling position and uses her L UE for support.  PT gave pt multiple cues to relax R UE and not hold it in the sling position.  UPPER EXTREMITY ROM:   AROM/PROM Right eval Left eval Right 6/9 Right 7/2  Shoulder flexion 62/83 with pain 154 129   Shoulder scaption 83 with pain 150    Shoulder abduction 69 with pain 112 with pain  109  Shoulder adduction      Shoulder internal rotation      Shoulder external rotation 13/18 51 47   Elbow flexion      Elbow extension      Wrist flexion      Wrist extension      Wrist ulnar deviation      Wrist radial deviation      Wrist pronation      Wrist supination      (Blank rows = not tested)  UPPER EXTREMITY MMT:  MMT Right eval Left eval  Shoulder flexion    Shoulder extension    Shoulder abduction    Shoulder adduction    Shoulder internal rotation    Shoulder external rotation    Middle trapezius    Lower trapezius    Elbow flexion    Elbow extension    Wrist flexion    Wrist extension    Wrist ulnar deviation    Wrist radial deviation    Wrist pronation    Wrist supination    Grip strength (lbs)    (Blank rows = not tested)  SHOULDER SPECIAL TESTS: Yergason's Test:  R: positive, L:  negative  Speed's Test:  R:   positive, L: negative Neer's Impingement:  R:  pain with limited ROM, L:  negative   PALPATION:  R anterior shoulder, coracoid process, and UT                                                                                                                             TREATMENT DATE:   [facility] Adult PT Treatment:                                                DATE: 02/03/24  Therapeutic Exercise: UBE, L1, forward, 1 min backward (increased discomfort and fatigue) Low doorway stretch x 15s x 2 Midlevel doorway stretch (elbows straight)  15s x 2 Bilat shoulder ext with cane behind back x 10 Forward ball rolls on elevated table (L stretch) for increased bilat shoulder flexion ROM x 5 R levator stretch (L lateral flexion)  Open book R rotation x 10, cues for head to follow UE  Star gazer stretch with palms on forehead -> on top of head in hooklying with LTR    Relaxed w's with scap squeeze x 3 sec x 5 Manual Therapy: IASTM to R ant delt, biceps brachii, pec, infraspinatus;  TPR to R rhomboid, levator scapula, upper trap, infraspinatus Sensitive skin rock tape applied to R prox biceps brachii near origin and over R infraspinatus to deltoid tuberosity- perpendicular pieces to assist with decompression.   [facility] Adult PT Treatment:                                                DATE: 01/26/24  Therapeutic Exercise: UBE, L1, forward, 1 min backward (increased pain) Low doorway stretch x 15s x 2 Midlevel doorway stretch (elbows straight)  15s x 2 Bilat shoulder ext with cane behind back x 10 Seated thoracic ext with hands behind head, x 10s x 2 Relaxed w's with scap squeeze x 5 sec  Shoulder rolls  Open book R rotation x 6 Star gazer stretch with palms on forehead -> LTR Manual Therapy: IASTM to R ant delt, biceps brachii, pec, infraspinatus;  TPR to R rhomboid, levator scapula, upper trap, infraspinatus Sensitive skin rock tape applied to R prox biceps brachii near origin and over R  infraspinatus to deltoid tuberosity- perpendicular pieces to assist with decompression.    [facility] Adult PT Treatment:                                                DATE: 01/19/24  Therapeutic Exercise: Scap squeeze x 5 sec x 5 Low doorway stretch x 15s x 2 Midlevel doorway stretch (elbows straight)  15s x 2 Shoulder rolls  High level doorway stretch  - not tolerated Overhead doorway stretch x 10s (limited tolerance) L stretch (like table slide) with arms on elevated desk x 15s Manual Therapy: IASTM to R ant delt, biceps brachii, pec, infraspinatus;  TPR to R rhomboid, levator scapula, upper trap, infraspinatus Sensitive skin rock tape applied to R prox biceps brachii near origin and over R infraspinatus to deltoid tuberosity Self Care: Pt instructed in self massage to R upper trap with ball and door frame; returned demo with cues.   PATIENT EDUCATION: Education details:  treatment / exercise rationale and modifications   Person educated: Patient Education method: Explanation, Demonstration, Tactile cues, Verbal cues, Education comprehension: verbalized understanding, returned demonstration, verbal cues required, tactile cues required, and needs further education  HOME EXERCISE PROGRAM: Access Code: [email or web address] URL: [email or web address] Date: 12/23/2023 Prepared by: Mose Minerva  Exercises - Seated Scapular Retraction  - 2 x daily - 7 x weekly - 2 sets - 10 reps - 3 seconds hold  Updated HEP: - Seated Shoulder Flexion Towel Slide at Table Top  - 1-2 x daily - 7 x weekly - 1-2 sets - 10 reps - Supine Shoulder Flexion AAROM   - 1 x daily - 7 x weekly - 3 sets - 10 reps - Sidelying Shoulder External Rotation  - 1 x daily - 7 x weekly - 2 sets - 10 reps - Isometric Shoulder Flexion at Wall  - 1 x daily - 5 x weekly - 2 sets - 10 reps - 5 seconds hold  ASSESSMENT:  CLINICAL IMPRESSION: Improved pain sensitivity with STM to R infraspinatus.  Improved tolerance for  open book and star gazer stretch. Reapplied tape, as pt reported improved tolerance for R shoulder ROM with tape applied. she should benefit from skilled PT to improve ROM, pain, and strength and to assist with returning to PLOF. ROM  continues to improve.  Pt has partially met STG and is making gradual progress towards LTGs.  POC ended today; PT to assess goals for recert next visit.    OBJECTIVE IMPAIRMENTS: decreased activity tolerance, decreased ROM, decreased strength, hypomobility, increased muscle spasms, impaired flexibility, impaired UE functional use, and pain.   ACTIVITY LIMITATIONS: carrying, lifting, sleeping, bathing, dressing, reach over head, and hygiene/grooming  PARTICIPATION LIMITATIONS: cleaning, driving, shopping, community activity, and occupation  PERSONAL FACTORS: 1 comorbidity: DM type 2 are also affecting patient's functional outcome.   REHAB POTENTIAL: Good  CLINICAL DECISION MAKING: Stable/uncomplicated  EVALUATION COMPLEXITY: Low   GOALS:   SHORT TERM GOALS: Target date: 01/13/2024   Pt will be independent and compliant with HEP for improved ROM, pain, strength, and function.  Baseline: Goal status: IN PROGRESS - 02/03/24  2.  Pt will report at least a 25% improvement in pain and performance of ADL's.   Baseline: 40% Goal status: MET - 02/03/24  3.  Pt will be able to drive without increased pain.  Baseline: painful with driving in reverse Goal status: Partially MET 02/03/24 Target date:  01/20/2024  4.  Pt will demo at least a 30 deg increase in flexion, abd, and scaption AROM and a 25 deg increase in ER AROM for improved reaching and performance of daily activities.  Baseline:  Goal status:  Partially met -02/03/24    LONG TERM GOALS: Target date: 02/03/2024  Pt will be able to reach overhead into a cabinet without difficulty and significant pain.  Baseline:  Goal status: Partially Met -02/03/24  2.  Pt will be able to perform her work activities  without significant pain.  Baseline:  Goal status: INITIAL  3.  Pt will be able to perform self care activities/ADL's without difficulty and significant pain.   Baseline:  Goal status: INITIAL  4.  Pt will be able to perform her normal reaching activities without significant pain Baseline:  Goal status: INITIAL  5.  Pt will demo R shoulder AROM to be [facility] t/o for performance of ADLs and IADLs.  Baseline:  Goal status: INITIAL    PLAN:  PT FREQUENCY: 2x/week  PT DURATION: 6 weeks  PLANNED INTERVENTIONS: 97164- PT Re-evaluation, 97750- Physical Performance Testing, 97110-Therapeutic exercises, 97530- Therapeutic activity, [id number]- Neuromuscular re-education, 97535- Self Care, [id number]- Manual therapy, [id number]- Aquatic Therapy, H9716- Electrical stimulation (unattended), [id number]- Electrical stimulation (manual), 97035- Ultrasound, Patient/Family education, Taping, Dry Needling, Joint mobilization, Spinal mobilization, Cryotherapy, and Moist heat  PLAN FOR NEXT SESSION: STM to UT.  Shoulder ROM.  Scapular exercises per pt tolerance.   Delon Aquas, PTA 02/03/24 5:01 PM [facility] [address] [place], [place], [address] Phone: [phone]   Fax:  [phone]

## 2024-02-04 ENCOUNTER — Other Ambulatory Visit (HOSPITAL_BASED_OUTPATIENT_CLINIC_OR_DEPARTMENT_OTHER): Payer: Self-pay

## 2024-02-04 ENCOUNTER — Encounter (HOSPITAL_BASED_OUTPATIENT_CLINIC_OR_DEPARTMENT_OTHER): Admitting: Physical Therapy

## 2024-02-05 DIAGNOSIS — G4733 Obstructive sleep apnea (adult) (pediatric): Secondary | ICD-10-CM | POA: Diagnosis not present

## 2024-02-08 ENCOUNTER — Other Ambulatory Visit (HOSPITAL_BASED_OUTPATIENT_CLINIC_OR_DEPARTMENT_OTHER): Payer: Self-pay

## 2024-02-08 ENCOUNTER — Encounter (HOSPITAL_BASED_OUTPATIENT_CLINIC_OR_DEPARTMENT_OTHER): Admitting: Physical Therapy

## 2024-02-08 ENCOUNTER — Other Ambulatory Visit: Payer: Self-pay

## 2024-02-09 ENCOUNTER — Other Ambulatory Visit (HOSPITAL_BASED_OUTPATIENT_CLINIC_OR_DEPARTMENT_OTHER): Payer: Self-pay

## 2024-02-09 DIAGNOSIS — G4733 Obstructive sleep apnea (adult) (pediatric): Secondary | ICD-10-CM | POA: Diagnosis not present

## 2024-02-10 ENCOUNTER — Ambulatory Visit (HOSPITAL_BASED_OUTPATIENT_CLINIC_OR_DEPARTMENT_OTHER)
Admission: RE | Admit: 2024-02-10 | Discharge: 2024-02-10 | Disposition: A | Discharge status: Home / Self Care | Source: Ambulatory Visit | Attending: Sports Medicine | Admitting: Sports Medicine

## 2024-02-10 ENCOUNTER — Ambulatory Visit (INDEPENDENT_AMBULATORY_CARE_PROVIDER_SITE_OTHER): Admitting: Allergy and Immunology

## 2024-02-10 ENCOUNTER — Encounter: Payer: Self-pay | Admitting: Allergy and Immunology

## 2024-02-10 ENCOUNTER — Other Ambulatory Visit (HOSPITAL_BASED_OUTPATIENT_CLINIC_OR_DEPARTMENT_OTHER): Payer: Self-pay

## 2024-02-10 VITALS — BP 118/72 | HR 74 | Resp 18

## 2024-02-10 DIAGNOSIS — M25511 Pain in right shoulder: Secondary | ICD-10-CM | POA: Diagnosis not present

## 2024-02-10 DIAGNOSIS — M7581 Other shoulder lesions, right shoulder: Secondary | ICD-10-CM | POA: Diagnosis not present

## 2024-02-10 DIAGNOSIS — J3089 Other allergic rhinitis: Secondary | ICD-10-CM | POA: Diagnosis not present

## 2024-02-10 DIAGNOSIS — M75111 Incomplete rotator cuff tear or rupture of right shoulder, not specified as traumatic: Secondary | ICD-10-CM | POA: Diagnosis not present

## 2024-02-10 DIAGNOSIS — M19011 Primary osteoarthritis, right shoulder: Secondary | ICD-10-CM | POA: Diagnosis not present

## 2024-02-10 DIAGNOSIS — J301 Allergic rhinitis due to pollen: Secondary | ICD-10-CM | POA: Diagnosis not present

## 2024-02-10 DIAGNOSIS — G43909 Migraine, unspecified, not intractable, without status migrainosus: Secondary | ICD-10-CM

## 2024-02-10 DIAGNOSIS — T781XXD Other adverse food reactions, not elsewhere classified, subsequent encounter: Secondary | ICD-10-CM

## 2024-02-10 DIAGNOSIS — J4520 Mild intermittent asthma, uncomplicated: Secondary | ICD-10-CM | POA: Diagnosis not present

## 2024-02-10 MED ORDER — LORAZEPAM 1 MG PO TABS
1.0000 mg | ORAL_TABLET | ORAL | 0 refills | Status: DC
  Filled 2024-02-10: qty 1, 1d supply, fill #0

## 2024-02-10 MED ORDER — FREESTYLE LIBRE 14 DAY SENSOR MISC
3 refills | Status: DC
  Filled 2024-02-10: qty 2, 28d supply, fill #0
  Filled 2024-02-22 – 2024-03-10 (×2): qty 2, 28d supply, fill #1
  Filled 2024-04-07: qty 2, 28d supply, fill #2
  Filled 2024-05-05: qty 2, 28d supply, fill #3

## 2024-02-10 NOTE — Progress Notes (Unsigned)
 [place] - [place] - [place] - [place] - [place]   Follow-up Note  Referring Provider: Redmon, Noelle, [place] Primary Provider: Redmon, Noelle, PA Date of Office Visit: 02/10/2024  Subjective:   Aimee Hall (DOB: Sep 03, 1970) is a 53 y.o. 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  I last saw her in this clinic 12 January 2024 for her initial evaluation.  She has had some intermittent issues with her nose such as some nasal congestion some sneezing some itchiness and some itchy eyes.  She is not using any nasal steroid at this point in time.  She still continues to have headaches with a frequency of about twice a week.  She did contact her neurologist and she has an appointment in December 2025.  She is using a CPAP machine but apparently her CPAP mask does not really fit very well for it causes nasal bridge discomfort and also has leaks.  She has attempted to contact the company who supplied her with a mask but unfortunately a logistical block or communication block appears to be evident as she is unable to get a different mask.  She still has sensitivity to various scents and irritants.  Allergies as of 02/10/2024       Reactions   Atorvastatin Other (See Comments)   Muscle cramps   Dilaudid  [hydromorphone ]    Iodinated Contrast Media Hives   Shellfish Allergy         Medication List    Airsupra  90-80 MCG/ACT Aero Generic drug: Albuterol -Budesonide  Inhale 2 Inhalations into the lungs every 4 (four) hours as needed.   albuterol  108 (90 Base) MCG/ACT inhaler Commonly known as: VENTOLIN  HFA Inhale 2 puffs into the lungs every 4 (four) hours as needed for wheezing.   cetirizine  10 MG tablet Commonly known as: ZYRTEC  Take 1 tablet (10 mg total) by mouth  daily as needed for allergies or rhinitis.   clindamycin  1 % gel Commonly known as: CLINDAGEL  Apply 1 Application topically 2 (two) times daily.   clindamycin  1 % lotion Commonly known as: CLEOCIN  T Apply 1 application externally once daily.   diclofenac  50 MG EC tablet Commonly known as: VOLTAREN  Take 1 tablet (50 mg total) by mouth 2 (two) times daily with a meal.   EPINEPHrine  0.3 mg/0.3 mL Soaj injection Commonly known as: EpiPen  2-Pak Inject 0.3 mg into the skin as needed.   Neffy  2 MG/0.1ML Soln Generic drug: EPINEPHrine  Place 1 Dose into the nose as directed. For single use only. Use same nostril if another dose is needed.   fluticasone  50 MCG/ACT nasal spray Commonly known as: FLONASE  Place 1 spray into both nostrils daily as needed.   FreeStyle Libre 14 Day Sensor Misc Apply 1 sensor as directed and replace every 14 days.   LORazepam  1 MG tablet Commonly known as: ATIVAN  Take 1 tablet (1 mg total) by mouth 30 mins prior to MRI.   losartan 25 MG tablet Commonly known as: COZAAR Take 25 mg by mouth daily.   Mounjaro  5 MG/0.5ML Pen Generic drug: tirzepatide  Inject 5 mg into the skin once a week.   naproxen  sodium 550 MG tablet Commonly known as: ANAPROX  Take 1 tablet (550 mg total) by mouth 2 (two) times daily with a meal.   Olopatadine  HCl 0.2 % Soln Instill 1 drop  into each eye once daily as needed.   ondansetron  4 MG disintegrating tablet Commonly known as: ZOFRAN -ODT Place 1 tablet (4 mg total) on the tongue and allow to dissolve every 8 (eight) hours as needed.   Repatha  SureClick 140 MG/ML Soaj Generic drug: Evolocumab  Inject 140 mg into the skin every 14 (fourteen) days.   Ryaltris  665-25 MCG/ACT Susp Generic drug: Olopatadine -Mometasone 2 sprays each nostril 1-2 times per day   Ubrelvy  100 MG Tabs Generic drug: Ubrogepant  Take 1 tablet (100 mg total) by mouth every 2 (two) hours as needed. Maximum 200mg  a day.   Ubrelvy  100 MG  Tabs Generic drug: Ubrogepant  Take 1 tablet (100 mg total) by mouth at onset of headache. May repeat in 2 hours as needed. Max 2 in 24 hours.   Vitamin D  (Ergocalciferol ) 1.25 MG (50000 UNIT) Caps capsule Commonly known as: DRISDOL  Take 50,000 Units by mouth once a week.   ergocalciferol  1.25 MG (50000 UT) capsule Commonly known as: VITAMIN D2 Take 1 capsule (50,000 Units total) by mouth once a week.    Past Medical History:  Diagnosis Date   Asthma    Dehydration    Diabetes mellitus without complication (HCC)    Eczema    GERD (gastroesophageal reflux disease)    Syncopal episodes    Urticaria     Past Surgical History:  Procedure Laterality Date   NO PAST SURGERIES      Review of systems negative except as noted in HPI / PMHx or noted below:  Review of Systems  Constitutional: Negative.   HENT: Negative.    Eyes: Negative.   Respiratory: Negative.    Cardiovascular: Negative.   Gastrointestinal: Negative.   Genitourinary: Negative.   Musculoskeletal: Negative.   Skin: Negative.   Neurological: Negative.   Endo/Heme/Allergies: Negative.   Psychiatric/Behavioral: Negative.       Objective:   Vitals:   02/10/24 0814  BP: 118/72  Pulse: 74  Resp: 18  SpO2: 99%          Physical Exam Constitutional:      Appearance: She is not diaphoretic.  HENT:     Head: Normocephalic.     Right Ear: Tympanic membrane, ear canal and external ear normal.     Left Ear: Tympanic membrane, ear canal and external ear normal.     Nose: Nose normal. No mucosal edema or rhinorrhea.     Mouth/Throat:     Pharynx: Uvula midline. No oropharyngeal exudate.  Eyes:     Conjunctiva/sclera: Conjunctivae normal.  Neck:     Thyroid : No thyromegaly.     Trachea: Trachea normal. No tracheal tenderness or tracheal deviation.  Cardiovascular:     Rate and Rhythm: Normal rate and regular rhythm.     Heart sounds: Normal heart sounds, S1 normal and S2 normal. No murmur  heard. Pulmonary:     Effort: No respiratory distress.     Breath sounds: Normal breath sounds. No stridor. No wheezing or rales.  Lymphadenopathy:     Head:     Right side of head: No tonsillar adenopathy.     Left side of head: No tonsillar adenopathy.     Cervical: No cervical adenopathy.  Skin:    Findings: No erythema or rash.     Nails: There is no clubbing.  Neurological:     Mental Status: She is alert.     Diagnostics:    Spirometry was performed and demonstrated an FEV1 of 2.68 at 92 % of  predicted.  Results of blood test obtained 12 January 2024 identifies IgE antibodies directed against multiple members of seafood on a shellfish panel and a fish panel, IgE antibodies directed against multiple aeroallergens including dust mite, cat, dog, trees, grasses, weeds, molds.  Assessment and Plan:   1. Perennial allergic rhinitis   2. Seasonal allergic rhinitis due to pollen   3. Asthma, mild intermittent, well-controlled   4. Migraine syndrome   5. Adverse food reaction, subsequent encounter    1. Allergen avoidance measures - dust mite, cat, dog, pollens, mold, shellfish, fish  2. Minimize exposure to scents / fumes  3. Minimize caffeine consumption to decrease headache  4. Discuss with Dr. Ines about a headache preventative agent  5. Treat and prevent allergic reactions involving airway and eyes:   A. Sports glasses and mask during extensive exposure  B. Ryaltris  - 2 sprays each nostril 1-2 times per day (SP)  6. If needed:  A. Cetirizine  10 mg - 1 tablet 1 time per day if needed  B. Pataday  - 1 drop each eye 1 time per day if needed  C. Airsupra  - 2 inhalations every 4-6 hours (coupon) if needed  D. NEFFY , benadryl, MD/ER evaluation for allergic reaction  7. Consider starting immunotherapy    8. Return to clinic in 6 months or earlier if problem  9. Influenza = Tamiflu. Covid = Paxlovid  Rekha should consider starting a course of immunotherapy directed  against her multiorgan atopic disease as she is very allergic and I do not think that her medications are really going to provide her adequate protection against the development of atopic symptoms especially as she goes through the seasons of the year.  Decreasing her atopic mucosal hypersensitivity will also decrease her sensitivity to various respiratory irritants.  She needs to get her headaches under better control and that may be an issue with adjusting her CPAP mask and she can follow-up with her neurologist regarding that issue.  Obviously she cannot eat shellfish or fish.  We will see her back in this clinic in 6 months or earlier if there is a problem.  Camellia Denis, MD Allergy / Immunology [facility]

## 2024-02-10 NOTE — Patient Instructions (Addendum)
  1. Allergen avoidance measures - dust mite, cat, dog, pollens, mold, shellfish, fish  2. Minimize exposure to scents / fumes  3. Minimize caffeine consumption to decrease headache  4. Discuss with Dr. Ines about a headache preventative agent  5. Treat and prevent allergic reactions involving airway and eyes:   A. Sports glasses and mask during extensive exposure  B. Ryaltris  - 2 sprays each nostril 1-2 times per day (SP)  6. If needed:  A. Cetirizine  10 mg - 1 tablet 1 time per day if needed  B. Pataday  - 1 drop each eye 1 time per day if needed  C. Airsupra  - 2 inhalations every 4-6 hours (coupon) if needed  D. NEFFY , benadryl, MD/ER evaluation for allergic reaction  7. Consider starting immunotherapy    8. Return to clinic in 6 months or earlier if problem  9. Influenza = Tamiflu. Covid = Paxlovid

## 2024-02-11 ENCOUNTER — Encounter: Payer: Self-pay | Admitting: Allergy and Immunology

## 2024-02-11 ENCOUNTER — Ambulatory Visit (HOSPITAL_BASED_OUTPATIENT_CLINIC_OR_DEPARTMENT_OTHER): Admitting: Physical Therapy

## 2024-02-11 DIAGNOSIS — M25511 Pain in right shoulder: Secondary | ICD-10-CM | POA: Diagnosis not present

## 2024-02-11 DIAGNOSIS — M25611 Stiffness of right shoulder, not elsewhere classified: Secondary | ICD-10-CM

## 2024-02-11 DIAGNOSIS — M6281 Muscle weakness (generalized): Secondary | ICD-10-CM | POA: Diagnosis not present

## 2024-02-11 NOTE — Therapy (Signed)
 OUTPATIENT PHYSICAL THERAPY SHOULDER TREATMENT / PROGRESS NOTE   Patient Name: Aimee Hall MRN: [id number] DOB:[birth year], 53 y.o., female Today's Date: 02/12/2024  END OF SESSION:  PT End of Session - 02/11/24 1627     Visit Number 6    Number of Visits 18    Date for PT Re-Evaluation 03/24/24    Authorization Type BC[employer]    PT Start Time 1624    PT Stop Time 1713    PT Time Calculation (min) 49 min    Activity Tolerance Patient tolerated treatment well    Behavior During Therapy WFL for tasks assessed/performed           Past Medical History:  Diagnosis Date   Asthma    Dehydration    Diabetes mellitus without complication (HCC)    Eczema    GERD (gastroesophageal reflux disease)    Syncopal episodes    Urticaria    Past Surgical History:  Procedure Laterality Date   NO PAST SURGERIES     Patient Active Problem List   Diagnosis Date Noted   Chronic migraine without aura, with intractable migraine, so stated, with status migrainosus 02/16/2023    PCP: Alvera Reagin, PA  REFERRING PROVIDER: Dasie Fitch, MD  REFERRING DIAG: M75.21 (ICD-10-CM) - Bicipital tendinitis, right shoulder  THERAPY DIAG:  Right shoulder pain, unspecified chronicity  Stiffness of right shoulder, not elsewhere classified  Muscle weakness (generalized)  Rationale for Evaluation and Treatment: Rehabilitation  ONSET DATE: Nov 2024  SUBJECTIVE:                                                                                                                                                                                      SUBJECTIVE STATEMENT: It's better than what it was, but my inflammation is down.  When my inflammation is up, my pain is up. When my hip hurts, my shoulder hurts.  Pt reports improved shoulder mobility, but she can have a 10/10 with certain movements.  She has increased pain with picking up backpack.  Pt has a 6/10 pain with picking up backpack.  She has  increased pain after 5 hours of using her mouse at work and the pain continues to increase.  Pt has increased pain with donning bra.  She reports improved pain with reaching her head.  Pt has increased pain with reaching overhead.  Pt reports 40% improvement in pain since starting therapy.     Pt states she read the results from her MRI though has not spoken with MD yet.    Hand dominance: Right  PERTINENT HISTORY: Bilat hip pain DM type 2, obesity, migraine/HA's, anxiety  PAIN:  NPRS:  3/10 current, 10/10 worst, 1/10 best Location:  anterior R shoulder, biceps origin, UT Type:  dull at rest, sharp with movement.  constant  PRECAUTIONS: None    WEIGHT BEARING RESTRICTIONS: No  FALLS:  Has patient fallen in last 6 months? 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  DIAGNOSTIC FINDINGS:  Pt had x rays though PT unable to view them.  MRI: FINDINGS:   Bones: Moderate AC joint arthrosis is present with reactive edema and mild hypertrophy. Mild glenohumeral arthrosis is present. No significant joint effusion. There is an indeterminate bubbly cystic lesion in the posterior lateral humeral head measuring 1.5 cm in maximum size. This could be a benign cyst or possibly chondroid lesion. No aggressive features are present. Correlation with x-ray recommended to evaluate for possible calcific matrix. Otherwise, the bones are unremarkable. There is no fracture or contusion pattern.   Rotator cuff: There is mild insertional tendinosis of the supraspinatus and infraspinatus tendons. There is no significant partial or full-thickness tear. There is a mild intrasubstance  partial tear of the supraspinatus tendon. No full-thickness tear is present. No significant fatty atrophy of the rotator cuff muscles. The subscapularis and teres minor tendons are unremarkable.   Labrum and biceps tendon: Biceps tendon is intact. There is mild blunting of the superior labrum without a well defined superior labral tear. The anterior and posterior labrum are unremarkable.     IMPRESSION: Moderate AC joint arthrosis with reactive edema and mild hypertrophy. Correlation for [facility] joint symptoms.   Mild insertional tendinosis of the supraspinatus tendon with a mild interstitial partial tear. No significant partial or full-thickness rotator cuff tear is present.   Biceps tendon is intact.   Slight blunting of the superior labrum without evidence of a discrete superior labral tear.  PATIENT SURVEYS:  UEFI:  36/80  COGNITION: Overall cognitive status: Within functional limits for tasks assessed      UPPER EXTREMITY ROM:   AROM/PROM Right eval Left eval Right 6/9 Right 7/2 Right 7/10  Shoulder flexion 62/83 with pain 154 129  148  Shoulder scaption 83 with pain 150   133  Shoulder abduction 69 with pain 112 with pain  109 92  Shoulder adduction       Shoulder internal rotation       Shoulder external rotation 13/18 51 47  38/50  Elbow flexion       Elbow extension       Wrist flexion       Wrist extension       Wrist ulnar deviation       Wrist radial deviation       Wrist pronation       Wrist supination       (Blank rows = not tested)  UPPER EXTREMITY MMT:  MMT Right eval Left eval Right 7/10  Shoulder flexion     Shoulder extension     Shoulder abduction     Shoulder adduction     Shoulder internal rotation     Shoulder external rotation   5/5  Middle trapezius     Lower trapezius     Elbow flexion  Elbow extension     Wrist flexion     Wrist extension     Wrist ulnar deviation     Wrist radial deviation     Wrist pronation      Wrist supination     Grip strength (lbs)     (Blank rows = not tested)  SHOULDER SPECIAL TESTS: Yergason's Test:  R: negative, L:  negative  Speed's Test:  R:  positive, L: negative                                                                                                                                TREATMENT DATE:   02/11/2024 Reviewed current function and pain levels.  Assessed shoulder ROM, shoulder ER strength, and special testing.  See above. Reviewed goals and educated pt concerning progress and objective findings. Pt completed UEFI.  Pt received STM to R UT in sitting to improve soft tissue tightness and mobility and reduce muscle spasm for improved usage of R UE.  PT used kinesiotape on R shoulder and proximal biceps with 2 strips crossing.  PT  instructed pt in appropriate wear time and to remove the tape if it irritates her skin.      [facility] Adult PT Treatment:                                                DATE: 02/03/24  Therapeutic Exercise: UBE, L1, forward, 1 min backward (increased discomfort and fatigue) Low doorway stretch x 15s x 2 Midlevel doorway stretch (elbows straight)  15s x 2 Bilat shoulder ext with cane behind back x 10 Forward ball rolls on elevated table (L stretch) for increased bilat shoulder flexion ROM x 5 R levator stretch (L lateral flexion)  Open book R rotation x 10, cues for head to follow UE  Star gazer stretch with palms on forehead -> on top of head in hooklying with LTR    Relaxed w's with scap squeeze x 3 sec x 5 Manual Therapy: IASTM to R ant delt, biceps brachii, pec, infraspinatus;  TPR to R rhomboid, levator scapula, upper trap, infraspinatus Sensitive skin rock tape applied to R prox biceps brachii near origin and over R infraspinatus to deltoid tuberosity- perpendicular pieces to assist with decompression.   [facility] Adult PT Treatment:                                                DATE: 01/26/24  Therapeutic  Exercise: UBE, L1, forward, 1 min backward (increased pain) Low doorway stretch x 15s x 2 Midlevel doorway stretch (elbows straight)  15s x 2 Bilat shoulder ext with cane behind back  x 10 Seated thoracic ext with hands behind head, x 10s x 2 Relaxed w's with scap squeeze x 5 sec  Shoulder rolls  Open book R rotation x 6 Star gazer stretch with palms on forehead -> LTR Manual Therapy: IASTM to R ant delt, biceps brachii, pec, infraspinatus;  TPR to R rhomboid, levator scapula, upper trap, infraspinatus Sensitive skin rock tape applied to R prox biceps brachii near origin and over R infraspinatus to deltoid tuberosity- perpendicular pieces to assist with decompression.    [facility] Adult PT Treatment:                                                DATE: 01/19/24  Therapeutic Exercise: Scap squeeze x 5 sec x 5 Low doorway stretch x 15s x 2 Midlevel doorway stretch (elbows straight)  15s x 2 Shoulder rolls  High level doorway stretch  - not tolerated Overhead doorway stretch x 10s (limited tolerance) L stretch (like table slide) with arms on elevated desk x 15s Manual Therapy: IASTM to R ant delt, biceps brachii, pec, infraspinatus;  TPR to R rhomboid, levator scapula, upper trap, infraspinatus Sensitive skin rock tape applied to R prox biceps brachii near origin and over R infraspinatus to deltoid tuberosity Self Care: Pt instructed in self massage to R upper trap with ball and door frame; returned demo with cues.   PATIENT EDUCATION: Education details:  exercise form, dx, prognosis, rationale of interventions, progress, objective findings, goal progress, and taping. Person educated: Patient Education method: Explanation, Demonstration, Tactile cues, Verbal cues, Education comprehension: verbalized understanding, returned demonstration, verbal cues required, tactile cues required, and needs further education  HOME EXERCISE PROGRAM: Access Code: [email or web address] URL:  [email or web address] Date: 12/23/2023 Prepared by: Mose Minerva  Exercises - Seated Scapular Retraction  - 2 x daily - 7 x weekly - 2 sets - 10 reps - 3 seconds hold   - Seated Shoulder Flexion Towel Slide at Table Top  - 1-2 x daily - 7 x weekly - 1-2 sets - 10 reps - Supine Shoulder Flexion AAROM   - 1 x daily - 7 x weekly - 3 sets - 10 reps - Sidelying Shoulder External Rotation  - 1 x daily - 7 x weekly - 2 sets - 10 reps - Isometric Shoulder Flexion at Wall  - 1 x daily - 5 x weekly - 2 sets - 10 reps - 5 seconds hold  ASSESSMENT:  CLINICAL IMPRESSION: Improved pain sensitivity with STM to R infraspinatus.  Improved tolerance for open book and star gazer stretch. Reapplied tape, as pt reported improved tolerance for R shoulder ROM with tape applied. she should benefit from skilled PT to improve ROM, pain, and strength and to assist with returning to PLOF. ROM  continues to improve.  Pt has partially met STG and is making gradual progress towards LTGs.  POC ended today; PT to assess goals for recert next visit.   Pt reports improved shoulder mobility, but she can have a 10/10 with certain movements.  She has increased pain with picking up backpack.  Pt has a 6/10 pain with picking up backpack.  She has increased pain after 5 hours of using her mouse at work and the pain continues to increase.  Pt has increased pain with donning bra.  She reports improved pain with reaching her head.  Pt has  increased pain with reaching overhead.  Pt reports 40% improvement in pain since starting therapy.     Current and best pain have improved Pt continues to have a positive Speed's test though now has a negative Yergason's test.     OBJECTIVE IMPAIRMENTS: decreased activity tolerance, decreased ROM, decreased strength, hypomobility, increased muscle spasms, impaired flexibility, impaired UE functional use, and pain.   ACTIVITY LIMITATIONS: carrying, lifting, sleeping, bathing, dressing,  reach over head, and hygiene/grooming  PARTICIPATION LIMITATIONS: cleaning, driving, shopping, community activity, and occupation  PERSONAL FACTORS: 1 comorbidity: DM type 2 are also affecting patient's functional outcome.   REHAB POTENTIAL: Good  CLINICAL DECISION MAKING: Stable/uncomplicated  EVALUATION COMPLEXITY: Low   GOALS:   SHORT TERM GOALS: Target date: 01/13/2024   Pt will be independent and compliant with HEP for improved ROM, pain, strength, and function.  Baseline: Goal status: IN PROGRESS - 02/03/24  2.  Pt will report at least a 25% improvement in pain and performance of ADL's.   Baseline: 40% Goal status: MET - 02/03/24  3.  Pt will be able to drive without increased pain.  Baseline: painful with driving in reverse Goal status: Partially MET 02/03/24 Target date:  01/20/2024  4.  Pt will demo at least a 30 deg increase in flexion, abd, and scaption AROM and a 25 deg increase in ER AROM for improved reaching and performance of daily activities.  Baseline:  Goal status:  Partially met -02/03/24    LONG TERM GOALS: Target date: 03/24/2024  Pt will be able to reach overhead into a cabinet without difficulty and significant pain.  Baseline:  Goal status: Partially Met -02/03/24  2.  Pt will be able to perform her work activities without significant pain.  Baseline:  Goal status: NOT MET  3.  Pt will be able to perform self care activities/ADL's without difficulty and significant pain.   Baseline:  Goal status: PROGRESSING  4.  Pt will be able to perform her normal reaching activities without significant pain Baseline:  Goal status: PROGRESSING  5.  Pt will demo R shoulder AROM to be [facility] t/o for performance of ADLs and IADLs.  Baseline:  Goal status: INITIAL    PLAN:  PT FREQUENCY: 2x/week  PT DURATION: 6 weeks  PLANNED INTERVENTIONS: 97164- PT Re-evaluation, 97750- Physical Performance Testing, 97110-Therapeutic exercises, 97530- Therapeutic activity,  [id number]- Neuromuscular re-education, 97535- Self Care, [id number]- Manual therapy, [id number]- Aquatic Therapy, [id number]- Electrical stimulation (unattended), [id number]- Electrical stimulation (manual), 97035- Ultrasound, Patient/Family education, Taping, Dry Needling, Joint mobilization, Spinal mobilization, Cryotherapy, and Moist heat  PLAN FOR NEXT SESSION: STM to UT.  Shoulder ROM.  Scapular exercises per pt tolerance. Cont with taping.   Delon Aquas, PTA 02/12/24 10:04 PM [facility] [address] [place], [place], [address] Phone: [phone]   Fax:  [phone]

## 2024-02-12 ENCOUNTER — Encounter (HOSPITAL_BASED_OUTPATIENT_CLINIC_OR_DEPARTMENT_OTHER): Payer: Self-pay | Admitting: Physical Therapy

## 2024-02-12 DIAGNOSIS — L732 Hidradenitis suppurativa: Secondary | ICD-10-CM | POA: Diagnosis not present

## 2024-02-12 DIAGNOSIS — G43109 Migraine with aura, not intractable, without status migrainosus: Secondary | ICD-10-CM | POA: Diagnosis not present

## 2024-02-12 DIAGNOSIS — E119 Type 2 diabetes mellitus without complications: Secondary | ICD-10-CM | POA: Diagnosis not present

## 2024-02-15 ENCOUNTER — Encounter (HOSPITAL_BASED_OUTPATIENT_CLINIC_OR_DEPARTMENT_OTHER): Admitting: Physical Therapy

## 2024-02-15 DIAGNOSIS — E1169 Type 2 diabetes mellitus with other specified complication: Secondary | ICD-10-CM | POA: Diagnosis not present

## 2024-02-15 DIAGNOSIS — E559 Vitamin D deficiency, unspecified: Secondary | ICD-10-CM | POA: Diagnosis not present

## 2024-02-15 DIAGNOSIS — M19011 Primary osteoarthritis, right shoulder: Secondary | ICD-10-CM | POA: Diagnosis not present

## 2024-02-15 DIAGNOSIS — K5909 Other constipation: Secondary | ICD-10-CM | POA: Diagnosis not present

## 2024-02-15 DIAGNOSIS — E7800 Pure hypercholesterolemia, unspecified: Secondary | ICD-10-CM | POA: Diagnosis not present

## 2024-02-16 ENCOUNTER — Encounter: Payer: Self-pay | Admitting: Allergy and Immunology

## 2024-02-16 NOTE — Telephone Encounter (Signed)
 I called patient and she states her reaction happened today. 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 She feels a little better but she was experiencing SOB and chest tightness. She states she also had some hives. She took some zyrtec  and used her rescue inhaler. She does have her epi pen on hand. I did advice her if her symptoms get worse than she can either use her epi or go to urgent care.  Patient wants to know if we can prescribe a portable neb for her. Please advice. Thanks

## 2024-02-17 ENCOUNTER — Ambulatory Visit (HOSPITAL_BASED_OUTPATIENT_CLINIC_OR_DEPARTMENT_OTHER): Admitting: Physical Therapy

## 2024-02-17 ENCOUNTER — Other Ambulatory Visit: Payer: Self-pay

## 2024-02-17 ENCOUNTER — Other Ambulatory Visit (HOSPITAL_BASED_OUTPATIENT_CLINIC_OR_DEPARTMENT_OTHER): Payer: Self-pay

## 2024-02-17 DIAGNOSIS — M6281 Muscle weakness (generalized): Secondary | ICD-10-CM | POA: Diagnosis not present

## 2024-02-17 DIAGNOSIS — M25611 Stiffness of right shoulder, not elsewhere classified: Secondary | ICD-10-CM | POA: Diagnosis not present

## 2024-02-17 DIAGNOSIS — M25511 Pain in right shoulder: Secondary | ICD-10-CM

## 2024-02-17 MED ORDER — ALBUTEROL SULFATE (2.5 MG/3ML) 0.083% IN NEBU
2.5000 mg | INHALATION_SOLUTION | RESPIRATORY_TRACT | 3 refills | Status: AC | PRN
  Filled 2024-02-17: qty 150, 9d supply, fill #0

## 2024-02-17 NOTE — Telephone Encounter (Signed)
 Prescription for albuterol  neb sent to [facility]. Patient informed.

## 2024-02-17 NOTE — Therapy (Signed)
 OUTPATIENT PHYSICAL THERAPY SHOULDER TREATMENT / PROGRESS NOTE   Patient Name: Aimee Hall MRN: [id number] DOB:12/03/1970, 53 y.o., female Today's Date: 02/17/2024  END OF SESSION:     Past Medical History:  Diagnosis Date   Asthma    Dehydration    Diabetes mellitus without complication (HCC)    Eczema    GERD (gastroesophageal reflux disease)    Syncopal episodes    Urticaria    Past Surgical History:  Procedure Laterality Date   NO PAST SURGERIES     Patient Active Problem List   Diagnosis Date Noted   Chronic migraine without aura, with intractable migraine, so stated, with status migrainosus 02/16/2023    PCP: Alvera Reagin, PA  REFERRING PROVIDER: Dasie Fitch, MD  REFERRING DIAG: M75.21 (ICD-10-CM) - Bicipital tendinitis, right shoulder  THERAPY DIAG:  No diagnosis found.  Rationale for Evaluation and Treatment: Rehabilitation  ONSET DATE: Nov 2024  SUBJECTIVE:                                                                                                                                                                                      SUBJECTIVE STATEMENT: Pt had an allergic reaction to a seafood truck at work yesterday.  She used her epi pen and inhaler yesterday.  Pt reports her skin was a little irritated/itchy from the k-tape last Rx.  She removed it about 3 days later.  Pt saw MD on Monday and spoke about her MRI.  Pt was informed she had a small tear.  Pt received an injection and states her shoulder is feeling better.  She states the pain is not gone, though better.   Pt denies any adverse effects after prior Rx, just soreness.    Hand dominance: Right  PERTINENT HISTORY: Bilat hip pain DM type 2, obesity, migraine/HA's, anxiety  PAIN:  NPRS:  1-2/10 current, 10/10 worst, 1/10 best Location:  anterior R shoulder, biceps origin, UT Type:  dull at rest, sharp with movement.  constant  PRECAUTIONS: None    WEIGHT BEARING  RESTRICTIONS: No  FALLS:  Has patient fallen in last 6 months? 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  DIAGNOSTIC FINDINGS:  Pt had x rays though PT unable to view them.  MRI: FINDINGS:   Bones: Moderate AC joint arthrosis is present with  reactive edema and mild hypertrophy. Mild glenohumeral arthrosis is present. No significant joint effusion. There is an indeterminate bubbly cystic lesion in the posterior lateral humeral head measuring 1.5 cm in maximum size. This could be a benign cyst or possibly chondroid lesion. No aggressive features are present. Correlation with x-ray recommended to evaluate for possible calcific matrix. Otherwise, the bones are unremarkable. There is no fracture or contusion pattern.   Rotator cuff: There is mild insertional tendinosis of the supraspinatus and infraspinatus tendons. There is no significant partial or full-thickness tear. There is a mild intrasubstance partial tear of the supraspinatus tendon. No full-thickness tear is present. No significant fatty atrophy of the rotator cuff muscles. The subscapularis and teres minor tendons are unremarkable.   Labrum and biceps tendon: Biceps tendon is intact. There is mild blunting of the superior labrum without a well defined superior labral tear. The anterior and posterior labrum are unremarkable.     IMPRESSION: Moderate AC joint arthrosis with reactive edema and mild hypertrophy. Correlation for [facility] joint symptoms.   Mild insertional tendinosis of the supraspinatus tendon with a mild interstitial partial tear. No significant partial or full-thickness rotator cuff tear is  present.   Biceps tendon is intact.   Slight blunting of the superior labrum without evidence of a discrete superior labral tear.  PATIENT SURVEYS:  UEFI:  36/80  COGNITION: Overall cognitive status: Within functional limits for tasks assessed      UPPER EXTREMITY ROM:   AROM/PROM Right eval Left eval Right 6/9 Right 7/2 Right 7/10  Shoulder flexion 62/83 with pain 154 129  148  Shoulder scaption 83 with pain 150   133  Shoulder abduction 69 with pain 112 with pain  109 92  Shoulder adduction       Shoulder internal rotation       Shoulder external rotation 13/18 51 47  38/50  Elbow flexion       Elbow extension       Wrist flexion       Wrist extension       Wrist ulnar deviation       Wrist radial deviation       Wrist pronation       Wrist supination       (Blank rows = not tested)  UPPER EXTREMITY MMT:  MMT Right eval Left eval Right 7/10  Shoulder flexion     Shoulder extension     Shoulder abduction     Shoulder adduction     Shoulder internal rotation     Shoulder external rotation   5/5  Middle trapezius     Lower trapezius     Elbow flexion     Elbow extension     Wrist flexion     Wrist extension     Wrist ulnar deviation     Wrist radial deviation     Wrist pronation     Wrist supination     Grip strength (lbs)     (Blank rows = not tested)  SHOULDER SPECIAL TESTS: Yergason's Test:  R: negative, L:  negative  Speed's Test:  R:  positive, L: negative  TREATMENT DATE:   02/18/24 Reviewed pt presentation, pain levels, and response to prior Rx.  Pt received R shoulder PROM in flexion, scaption, and ER per pt tolerance and tissue tolerance  S/L ER 2x10 Supine serratus punches 2x10 Supine shoulder ABC x 1 reps   prone row 2x10  PT updated HEP and gave pt a HEP handout.  PT educated pt in correct form and  appropriate frequency.   Manual Therapy: Pt received STM and TPR to R UT and proximomedial scapular mm     02/11/2024 Reviewed current function and pain levels.  Assessed shoulder ROM, shoulder ER strength, and special testing.  See above. Reviewed goals and educated pt concerning progress and objective findings. Pt completed UEFI.  Pt received STM to R UT in sitting to improve soft tissue tightness and mobility and reduce muscle spasm for improved usage of R UE.  PT used kinesiotape on R shoulder and proximal biceps with 2 strips crossing.  PT  instructed pt in appropriate wear time and to remove the tape if it irritates her skin.      [facility] Adult PT Treatment:                                                DATE: 02/03/24  Therapeutic Exercise: UBE, L1, forward, 1 min backward (increased discomfort and fatigue) Low doorway stretch x 15s x 2 Midlevel doorway stretch (elbows straight)  15s x 2 Bilat shoulder ext with cane behind back x 10 Forward ball rolls on elevated table (L stretch) for increased bilat shoulder flexion ROM x 5 R levator stretch (L lateral flexion)  Open book R rotation x 10, cues for head to follow UE  Star gazer stretch with palms on forehead -> on top of head in hooklying with LTR    Relaxed w's with scap squeeze x 3 sec x 5 Manual Therapy: IASTM to R ant delt, biceps brachii, pec, infraspinatus;  TPR to R rhomboid, levator scapula, upper trap, infraspinatus Sensitive skin rock tape applied to R prox biceps brachii near origin and over R infraspinatus to deltoid tuberosity- perpendicular pieces to assist with decompression.     PATIENT EDUCATION: Education details:  exercise form, dx, prognosis, rationale of interventions, progress, objective findings, goal progress, and taping. Person educated: Patient Education method: Explanation, Demonstration, Tactile cues, Verbal cues, Education comprehension: verbalized understanding, returned demonstration,  verbal cues required, tactile cues required, and needs further education  HOME EXERCISE PROGRAM: Access Code: [email or web address] URL: [email or web address] Date: 12/23/2023 Prepared by: Mose Minerva  Exercises - Seated Scapular Retraction  - 2 x daily - 7 x weekly - 2 sets - 10 reps - 3 seconds hold   - Seated Shoulder Flexion Towel Slide at Table Top  - 1-2 x daily - 7 x weekly - 1-2 sets - 10 reps - Supine Shoulder Flexion AAROM   - 1 x daily - 7 x weekly - 3 sets - 10 reps - Sidelying Shoulder External Rotation  - 1 x daily - 7 x weekly - 2 sets - 10 reps - Isometric Shoulder Flexion at Wall  - 1 x daily - 5 x weekly - 2 sets - 10 reps - 5 seconds hold  Updated HEP: - Supine Single Arm Shoulder Protraction  - 1 x daily - 7 x weekly - 2 sets - 10 reps -  Supine Shoulder Alphabet  - 1 x daily - 7 x weekly - 1 reps  ASSESSMENT:  CLINICAL IMPRESSION: Pt is making progress in PT.  Though she is making progress, she continues to have significant pain and limitations with daily activities.Pt reports improved shoulder mobility, but she can have a 10/10 with certain movements.  Pt reports 40% improvement in pain since starting therapy.   She has increased pain with picking up backpack and after 5 hours of using her mouse at work.  She has improved pain with reaching her head though increased pain with reaching overhead.  Her current and best pain have improved.  She demonstrates a significant improvement in shoulder ROM.  Pt continues to have a positive Speed's test though now has a negative Yergason's test.  Pt responds well to manual techniques including STM and taping having improved pain and sx's.  Pt met STG #2 and partially met all other STG's and LTG #1.  Pt should benefit from cont skilled PT to address ongoing goals and impairments and to restore desired level of function.    Updated HEP. Feel so much better after STM   OBJECTIVE IMPAIRMENTS: decreased activity tolerance,  decreased ROM, decreased strength, hypomobility, increased muscle spasms, impaired flexibility, impaired UE functional use, and pain.   ACTIVITY LIMITATIONS: carrying, lifting, sleeping, bathing, dressing, reach over head, and hygiene/grooming  PARTICIPATION LIMITATIONS: cleaning, driving, shopping, community activity, and occupation  PERSONAL FACTORS: 1 comorbidity: DM type 2 are also affecting patient's functional outcome.   REHAB POTENTIAL: Good  CLINICAL DECISION MAKING: Stable/uncomplicated  EVALUATION COMPLEXITY: Low   GOALS:   SHORT TERM GOALS: Target date: 01/13/2024   Pt will be independent and compliant with HEP for improved ROM, pain, strength, and function.  Baseline: Goal status: IN PROGRESS - 02/03/24  2.  Pt will report at least a 25% improvement in pain and performance of ADL's.   Baseline: 40% Goal status: MET - 02/03/24  3.  Pt will be able to drive without increased pain.  Baseline: painful with driving in reverse Goal status: Partially MET 02/03/24 Target date:  01/20/2024  4.  Pt will demo at least a 30 deg increase in flexion, abd, and scaption AROM and a 25 deg increase in ER AROM for improved reaching and performance of daily activities.  Baseline:  Goal status:  75% MET   02/11/24    LONG TERM GOALS: Target date: 03/24/2024  Pt will be able to reach overhead into a cabinet without difficulty and significant pain.  Baseline:  Goal status: Partially Met -02/03/24  2.  Pt will be able to perform her work activities without significant pain.  Baseline:  Goal status: NOT MET  3.  Pt will be able to perform self care activities/ADL's without difficulty and significant pain.   Baseline:  Goal status: PROGRESSING  4.  Pt will be able to perform her normal reaching activities without significant pain Baseline:  Goal status: PROGRESSING  5.  Pt will demo R shoulder AROM to be [facility] t/o for performance of ADLs and IADLs.  Baseline:  Goal status:  PROGRESSING    PLAN:  PT FREQUENCY: 2x/week  PT DURATION: 6 weeks  PLANNED INTERVENTIONS: 97164- PT Re-evaluation, 97750- Physical Performance Testing, 97110-Therapeutic exercises, 97530- Therapeutic activity, [id number]- Neuromuscular re-education, 97535- Self Care, 02859- Manual therapy, [id number]- Aquatic Therapy, H9716- Electrical stimulation (unattended), [id number]- Electrical stimulation (manual), 97035- Ultrasound, Patient/Family education, Taping, Dry Needling, Joint mobilization, Spinal mobilization, Cryotherapy, and Moist heat  PLAN FOR NEXT  SESSION: STM to UT.  Shoulder ROM.  Scapular exercises per pt tolerance. Cont with taping.   Leigh Minerva III PT, DPT 02/17/24 4:06 PM  [facility] [address] [place], [place], [address] Phone: [phone]   Fax:  [phone]

## 2024-02-18 ENCOUNTER — Encounter (HOSPITAL_BASED_OUTPATIENT_CLINIC_OR_DEPARTMENT_OTHER): Payer: Self-pay | Admitting: Physical Therapy

## 2024-02-18 ENCOUNTER — Other Ambulatory Visit (HOSPITAL_BASED_OUTPATIENT_CLINIC_OR_DEPARTMENT_OTHER): Payer: Self-pay

## 2024-02-18 DIAGNOSIS — H00022 Hordeolum internum right lower eyelid: Secondary | ICD-10-CM | POA: Diagnosis not present

## 2024-02-18 MED ORDER — POLYMYXIN B-TRIMETHOPRIM 10000-0.1 UNIT/ML-% OP SOLN
1.0000 [drp] | Freq: Four times a day (QID) | OPHTHALMIC | 0 refills | Status: DC
  Filled 2024-02-18: qty 10, 25d supply, fill #0

## 2024-02-22 ENCOUNTER — Ambulatory Visit (HOSPITAL_BASED_OUTPATIENT_CLINIC_OR_DEPARTMENT_OTHER): Admitting: Physical Therapy

## 2024-02-22 ENCOUNTER — Other Ambulatory Visit: Payer: Self-pay

## 2024-02-22 ENCOUNTER — Encounter (HOSPITAL_BASED_OUTPATIENT_CLINIC_OR_DEPARTMENT_OTHER): Payer: Self-pay | Admitting: Physical Therapy

## 2024-02-22 ENCOUNTER — Other Ambulatory Visit (HOSPITAL_BASED_OUTPATIENT_CLINIC_OR_DEPARTMENT_OTHER): Payer: Self-pay

## 2024-02-22 ENCOUNTER — Encounter: Payer: Self-pay | Admitting: Neurology

## 2024-02-22 DIAGNOSIS — M25611 Stiffness of right shoulder, not elsewhere classified: Secondary | ICD-10-CM | POA: Diagnosis not present

## 2024-02-22 DIAGNOSIS — M25511 Pain in right shoulder: Secondary | ICD-10-CM | POA: Diagnosis not present

## 2024-02-22 DIAGNOSIS — M6281 Muscle weakness (generalized): Secondary | ICD-10-CM | POA: Diagnosis not present

## 2024-02-22 NOTE — Therapy (Signed)
 OUTPATIENT PHYSICAL THERAPY SHOULDER TREATMENT    Patient Name: Aimee Hall MRN: [id number] DOB:[birth year], 53 y.o., female Today's Date: 02/22/2024  END OF SESSION:  PT End of Session - 02/22/24 1634     Visit Number 8    Number of Visits 18    Date for PT Re-Evaluation 03/24/24    Authorization Type BCBS    PT Start Time 1630    PT Stop Time 1712    PT Time Calculation (min) 42 min    Activity Tolerance Patient tolerated treatment well    Behavior During Therapy WFL for tasks assessed/performed            Past Medical History:  Diagnosis Date   Asthma    Dehydration    Diabetes mellitus without complication (HCC)    Eczema    GERD (gastroesophageal reflux disease)    Syncopal episodes    Urticaria    Past Surgical History:  Procedure Laterality Date   NO PAST SURGERIES     Patient Active Problem List   Diagnosis Date Noted   Chronic migraine without aura, with intractable migraine, so stated, with status migrainosus 02/16/2023    PCP: Alvera Reagin, PA  REFERRING PROVIDER: Dasie Fitch, MD  REFERRING DIAG: M75.21 (ICD-10-CM) - Bicipital tendinitis, right shoulder  THERAPY DIAG:  Right shoulder pain, unspecified chronicity  Stiffness of right shoulder, not elsewhere classified  Muscle weakness (generalized)  Rationale for Evaluation and Treatment: Rehabilitation  ONSET DATE: Nov 2024  SUBJECTIVE:                                                                                                                                                                                      SUBJECTIVE STATEMENT: Pt denies any adverse effects after prior Rx, though had some soreness.  Pt denies pain currently.  It's the first day, I have not had pain in my shoulder.  Pt reports compliance with HEP.   Hand dominance: Right  PERTINENT HISTORY: Bilat hip pain DM type 2, obesity, migraine/HA's, anxiety  PAIN:  NPRS:  1-2/10 current, 10/10 worst, 1/10  best Location:  anterior R shoulder, biceps origin, UT Type:  dull at rest, sharp with movement.  constant  PRECAUTIONS: None    WEIGHT BEARING RESTRICTIONS: No  FALLS:  Has patient fallen in last 6 months? 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  DIAGNOSTIC FINDINGS:  Pt had x rays though PT unable to view them.  MRI: FINDINGS:   Bones: Moderate AC joint arthrosis is present with reactive edema and mild hypertrophy. Mild glenohumeral arthrosis is present. No significant joint effusion. There is an indeterminate bubbly cystic lesion in the posterior lateral humeral head measuring 1.5 cm in maximum size. This could be a benign cyst or possibly chondroid lesion. No aggressive features are present. Correlation with x-ray recommended to evaluate for possible calcific matrix. Otherwise, the bones are unremarkable. There is no fracture or contusion pattern.   Rotator cuff: There is mild insertional tendinosis of the supraspinatus and infraspinatus tendons. There is no significant partial or full-thickness tear. There is a mild intrasubstance partial tear of the supraspinatus tendon. No full-thickness tear is present. No significant fatty atrophy of the rotator cuff muscles. The subscapularis and teres minor tendons are unremarkable.   Labrum and biceps tendon: Biceps tendon is intact. There is mild blunting of the superior labrum without a well defined superior labral tear. The anterior and posterior labrum are unremarkable.     IMPRESSION: Moderate AC joint arthrosis with reactive edema and mild hypertrophy. Correlation for [facility] joint symptoms.   Mild  insertional tendinosis of the supraspinatus tendon with a mild interstitial partial tear. No significant partial or full-thickness rotator cuff tear is present.   Biceps tendon is intact.   Slight blunting of the superior labrum without evidence of a discrete superior labral tear.  PATIENT SURVEYS:  UEFI:  36/80  COGNITION: Overall cognitive status: Within functional limits for tasks assessed      UPPER EXTREMITY ROM:   AROM/PROM Right eval Left eval Right 6/9 Right 7/2 Right 7/10  Shoulder flexion 62/83 with pain 154 129  148  Shoulder scaption 83 with pain 150   133  Shoulder abduction 69 with pain 112 with pain  109 92  Shoulder adduction       Shoulder internal rotation       Shoulder external rotation 13/18 51 47  38/50  Elbow flexion       Elbow extension       Wrist flexion       Wrist extension       Wrist ulnar deviation       Wrist radial deviation       Wrist pronation       Wrist supination       (Blank rows = not tested)  UPPER EXTREMITY MMT:  MMT Right eval Left eval Right 7/10  Shoulder flexion     Shoulder extension     Shoulder abduction     Shoulder adduction     Shoulder internal rotation     Shoulder external rotation   5/5  Middle trapezius     Lower trapezius     Elbow flexion     Elbow extension     Wrist flexion     Wrist extension     Wrist ulnar deviation     Wrist radial deviation     Wrist pronation     Wrist supination     Grip strength (lbs)     (Blank rows = not tested)  SHOULDER SPECIAL TESTS: Yergason's Test:  R: negative, L:  negative  Speed's Test:  R:  positive, L: negative  TREATMENT DATE:   02/22/24  Pt received R shoulder PROM in flexion, scaption, and ER per pt tolerance and tissue tolerance Supine serratus punches 3x10 S/L ER 3x10 Supine shoulder ABC x 1 reps   Standing  rows with retraction with RTB 2x10  Manual Therapy: Pt received STM and TPR to R UT and proximomedial scapular mm   02/18/24 Reviewed pt presentation, pain levels, and response to prior Rx.  Pt received R shoulder PROM in flexion, scaption, and ER per pt tolerance and tissue tolerance  S/L ER 2x10 Supine serratus punches 2x10 Supine shoulder ABC x 1 reps   prone row 2x10  PT updated HEP and gave pt a HEP handout.  PT educated pt in correct form and appropriate frequency.   Manual Therapy: Pt received STM and TPR to R UT and proximomedial scapular mm    02/11/2024 Reviewed current function and pain levels.  Assessed shoulder ROM, shoulder ER strength, and special testing.  See above. Reviewed goals and educated pt concerning progress and objective findings. Pt completed UEFI.  Pt received STM to R UT in sitting to improve soft tissue tightness and mobility and reduce muscle spasm for improved usage of R UE.  PT used kinesiotape on R shoulder and proximal biceps with 2 strips crossing.  PT  instructed pt in appropriate wear time and to remove the tape if it irritates her skin.      PATIENT EDUCATION: Education details:  exercise form, dx, prognosis, rationale of interventions, and POC. Person educated: Patient Education method: Explanation, Demonstration, Tactile cues, Verbal cues, Education comprehension: verbalized understanding, returned demonstration, verbal cues required, tactile cues required, and needs further education  HOME EXERCISE PROGRAM: Access Code: [email or web address] URL: [email or web address] Date: 12/23/2023 Prepared by: Mose Minerva  Exercises - Seated Scapular Retraction  - 2 x daily - 7 x weekly - 2 sets - 10 reps - 3 seconds hold   - Seated Shoulder Flexion Towel Slide at Table Top  - 1-2 x daily - 7 x weekly - 1-2 sets - 10 reps - Supine Shoulder Flexion AAROM   - 1 x daily - 7 x weekly - 3 sets - 10 reps - Sidelying Shoulder External  Rotation  - 1 x daily - 7 x weekly - 2 sets - 10 reps - Isometric Shoulder Flexion at Wall  - 1 x daily - 5 x weekly - 2 sets - 10 reps - 5 seconds hold - Supine Single Arm Shoulder Protraction  - 1 x daily - 7 x weekly - 2 sets - 10 reps - Supine Shoulder Alphabet  - 1 x daily - 7 x weekly - 1 reps  ASSESSMENT:  CLINICAL IMPRESSION: Pt presented to treatment and reports having no pain in shoulder.  Pt tolerated PROM well without pain and demonstrated good PROM.  PT had pt perform her home exercises on the table and she states it felt like a good workout.  She performed exercises well with verbal and tactile cuing for correct form and positioning.  She had no pain with exercises.  Pt had a trigger point in UT though had improved tightness and reduced trigger points in UT today.  She responded well to Rx stating her shoulder felt good, like it was worked out, but no pain after Rx.  Pt should benefit from cont skilled PT to address ongoing goals and impairments and to restore desired level of function.     OBJECTIVE IMPAIRMENTS: decreased activity tolerance, decreased ROM, decreased strength, hypomobility,  increased muscle spasms, impaired flexibility, impaired UE functional use, and pain.   ACTIVITY LIMITATIONS: carrying, lifting, sleeping, bathing, dressing, reach over head, and hygiene/grooming  PARTICIPATION LIMITATIONS: cleaning, driving, shopping, community activity, and occupation  PERSONAL FACTORS: 1 comorbidity: DM type 2 are also affecting patient's functional outcome.   REHAB POTENTIAL: Good  CLINICAL DECISION MAKING: Stable/uncomplicated  EVALUATION COMPLEXITY: Low   GOALS:   SHORT TERM GOALS: Target date: 01/13/2024   Pt will be independent and compliant with HEP for improved ROM, pain, strength, and function.  Baseline: Goal status: IN PROGRESS - 02/03/24  2.  Pt will report at least a 25% improvement in pain and performance of ADL's.   Baseline: 40% Goal status: MET -  02/03/24  3.  Pt will be able to drive without increased pain.  Baseline: painful with driving in reverse Goal status: Partially MET 02/03/24 Target date:  01/20/2024  4.  Pt will demo at least a 30 deg increase in flexion, abd, and scaption AROM and a 25 deg increase in ER AROM for improved reaching and performance of daily activities.  Baseline:  Goal status:  75% MET   02/11/24    LONG TERM GOALS: Target date: 03/24/2024  Pt will be able to reach overhead into a cabinet without difficulty and significant pain.  Baseline:  Goal status: Partially Met -02/03/24  2.  Pt will be able to perform her work activities without significant pain.  Baseline:  Goal status: NOT MET  3.  Pt will be able to perform self care activities/ADL's without difficulty and significant pain.   Baseline:  Goal status: PROGRESSING  4.  Pt will be able to perform her normal reaching activities without significant pain Baseline:  Goal status: PROGRESSING  5.  Pt will demo R shoulder AROM to be [facility] t/o for performance of ADLs and IADLs.  Baseline:  Goal status: PROGRESSING    PLAN:  PT FREQUENCY: 2x/week  PT DURATION: 6 weeks  PLANNED INTERVENTIONS: 97164- PT Re-evaluation, 97750- Physical Performance Testing, 97110-Therapeutic exercises, 97530- Therapeutic activity, [id number]- Neuromuscular re-education, 97535- Self Care, 02859- Manual therapy, [id number]- Aquatic Therapy, H9716- Electrical stimulation (unattended), [id number]- Electrical stimulation (manual), 97035- Ultrasound, Patient/Family education, Taping, Dry Needling, Joint mobilization, Spinal mobilization, Cryotherapy, and Moist heat  PLAN FOR NEXT SESSION: STM to UT.  Shoulder ROM.  Scapular exercises per pt tolerance. Cont with taping.   Leigh Minerva III PT, DPT 02/22/24 5:38 PM    [facility] [address] [place], [place], [address] Phone: [phone]   Fax:  [phone]

## 2024-02-23 ENCOUNTER — Other Ambulatory Visit (HOSPITAL_BASED_OUTPATIENT_CLINIC_OR_DEPARTMENT_OTHER): Payer: Self-pay

## 2024-02-24 ENCOUNTER — Encounter (HOSPITAL_BASED_OUTPATIENT_CLINIC_OR_DEPARTMENT_OTHER): Admitting: Physical Therapy

## 2024-02-24 ENCOUNTER — Other Ambulatory Visit (HOSPITAL_BASED_OUTPATIENT_CLINIC_OR_DEPARTMENT_OTHER): Payer: Self-pay

## 2024-02-24 MED ORDER — VITAMIN D (ERGOCALCIFEROL) 1.25 MG (50000 UNIT) PO CAPS
50000.0000 [IU] | ORAL_CAPSULE | ORAL | 0 refills | Status: DC
  Filled 2024-02-24 – 2024-04-12 (×4): qty 12, 84d supply, fill #0

## 2024-02-25 ENCOUNTER — Other Ambulatory Visit (HOSPITAL_BASED_OUTPATIENT_CLINIC_OR_DEPARTMENT_OTHER): Payer: Self-pay

## 2024-02-25 ENCOUNTER — Other Ambulatory Visit: Payer: Self-pay | Admitting: Family Medicine

## 2024-02-25 DIAGNOSIS — G4733 Obstructive sleep apnea (adult) (pediatric): Secondary | ICD-10-CM

## 2024-02-25 DIAGNOSIS — R634 Abnormal weight loss: Secondary | ICD-10-CM

## 2024-02-25 NOTE — Telephone Encounter (Signed)
 SABRA

## 2024-02-26 ENCOUNTER — Other Ambulatory Visit (HOSPITAL_BASED_OUTPATIENT_CLINIC_OR_DEPARTMENT_OTHER): Payer: Self-pay

## 2024-02-29 ENCOUNTER — Other Ambulatory Visit (HOSPITAL_BASED_OUTPATIENT_CLINIC_OR_DEPARTMENT_OTHER): Payer: Self-pay

## 2024-03-01 ENCOUNTER — Encounter (HOSPITAL_BASED_OUTPATIENT_CLINIC_OR_DEPARTMENT_OTHER): Payer: Self-pay | Admitting: Physical Therapy

## 2024-03-01 ENCOUNTER — Ambulatory Visit (HOSPITAL_BASED_OUTPATIENT_CLINIC_OR_DEPARTMENT_OTHER): Admitting: Physical Therapy

## 2024-03-01 DIAGNOSIS — M6281 Muscle weakness (generalized): Secondary | ICD-10-CM

## 2024-03-01 DIAGNOSIS — M25611 Stiffness of right shoulder, not elsewhere classified: Secondary | ICD-10-CM

## 2024-03-01 DIAGNOSIS — M25511 Pain in right shoulder: Secondary | ICD-10-CM | POA: Diagnosis not present

## 2024-03-01 NOTE — Therapy (Signed)
 OUTPATIENT PHYSICAL THERAPY SHOULDER TREATMENT    Patient Name: Aimee Hall MRN: [id number] DOB:1970/10/29, 53 y.o., female Today's Date: 03/02/2024  END OF SESSION:  PT End of Session - 03/01/24 1621     Visit Number 9    Number of Visits 18    Date for PT Re-Evaluation 03/24/24    Authorization Type BCBS    PT Start Time 1620    PT Stop Time 1705    PT Time Calculation (min) 45 min    Activity Tolerance Patient tolerated treatment well    Behavior During Therapy WFL for tasks assessed/performed            Past Medical History:  Diagnosis Date   Asthma    Dehydration    Diabetes mellitus without complication (HCC)    Eczema    GERD (gastroesophageal reflux disease)    Syncopal episodes    Urticaria    Past Surgical History:  Procedure Laterality Date   NO PAST SURGERIES     Patient Active Problem List   Diagnosis Date Noted   Chronic migraine without aura, with intractable migraine, so stated, with status migrainosus 02/16/2023    PCP: Alvera Reagin, PA  REFERRING PROVIDER: Dasie Fitch, MD  REFERRING DIAG: M75.21 (ICD-10-CM) - Bicipital tendinitis, right shoulder  THERAPY DIAG:  Right shoulder pain, unspecified chronicity  Stiffness of right shoulder, not elsewhere classified  Muscle weakness (generalized)  Rationale for Evaluation and Treatment: Rehabilitation  ONSET DATE: Nov 2024  SUBJECTIVE:                                                                                                                                                                                      SUBJECTIVE STATEMENT: Pt denies any adverse effects after prior Rx.  Pt denies pain currently.  Pt reports compliance with HEP.   Hand dominance: Right  PERTINENT HISTORY: Bilat hip pain DM type 2, obesity, migraine/HA's, anxiety  PAIN:  NPRS:  0/10 current, 10/10 worst, 0/10 best Location:  anterior R shoulder, biceps origin, UT Type:  dull at rest, sharp with  movement.  constant  PRECAUTIONS: None    WEIGHT BEARING RESTRICTIONS: No  FALLS:  Has patient fallen in last 6 months? 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  DIAGNOSTIC FINDINGS:  Pt had x rays though  PT unable to view them.  MRI: FINDINGS:   Bones: Moderate AC joint arthrosis is present with reactive edema and mild hypertrophy. Mild glenohumeral arthrosis is present. No significant joint effusion. There is an indeterminate bubbly cystic lesion in the posterior lateral humeral head measuring 1.5 cm in maximum size. This could be a benign cyst or possibly chondroid lesion. No aggressive features are present. Correlation with x-ray recommended to evaluate for possible calcific matrix. Otherwise, the bones are unremarkable. There is no fracture or contusion pattern.   Rotator cuff: There is mild insertional tendinosis of the supraspinatus and infraspinatus tendons. There is no significant partial or full-thickness tear. There is a mild intrasubstance partial tear of the supraspinatus tendon. No full-thickness tear is present. No significant fatty atrophy of the rotator cuff muscles. The subscapularis and teres minor tendons are unremarkable.   Labrum and biceps tendon: Biceps tendon is intact. There is mild blunting of the superior labrum without a well defined superior labral tear. The anterior and posterior labrum are unremarkable.     IMPRESSION: Moderate AC joint arthrosis with reactive edema and mild hypertrophy. Correlation for [facility] joint symptoms.   Mild insertional tendinosis of the supraspinatus tendon with a mild interstitial partial tear. No  significant partial or full-thickness rotator cuff tear is present.   Biceps tendon is intact.   Slight blunting of the superior labrum without evidence of a discrete superior labral tear.  PATIENT SURVEYS:  UEFI:  36/80  COGNITION: Overall cognitive status: Within functional limits for tasks assessed      UPPER EXTREMITY ROM:   AROM/PROM Right eval Left eval Right 6/9 Right 7/2 Right 7/10  Shoulder flexion 62/83 with pain 154 129  148  Shoulder scaption 83 with pain 150   133  Shoulder abduction 69 with pain 112 with pain  109 92  Shoulder adduction       Shoulder internal rotation       Shoulder external rotation 13/18 51 47  38/50  Elbow flexion       Elbow extension       Wrist flexion       Wrist extension       Wrist ulnar deviation       Wrist radial deviation       Wrist pronation       Wrist supination       (Blank rows = not tested)  UPPER EXTREMITY MMT:  MMT Right eval Left eval Right 7/10  Shoulder flexion     Shoulder extension     Shoulder abduction     Shoulder adduction     Shoulder internal rotation     Shoulder external rotation   5/5  Middle trapezius     Lower trapezius     Elbow flexion     Elbow extension     Wrist flexion     Wrist extension     Wrist ulnar deviation     Wrist radial deviation     Wrist pronation     Wrist supination     Grip strength (lbs)     (Blank rows = not tested)  SHOULDER SPECIAL TESTS: Yergason's Test:  R: negative, L:  negative  Speed's Test:  R:  positive, L: negative  TREATMENT DATE:   03/01/24:  Therapeutic Exercise:  Pt received R shoulder PROM in flexion, abd, and ER in supine per pt and tissue tolerance.   Supine serratus punch 1# 1x10, 2# 2x10 Supine shoulder ABC x 1 rep with 1# S/L ER  2x10 Standing rows with retraction with RTB 2x10 Standing shoulder  extension with retraction with RTB 2x10 Standing ER with YTB with arm at side 1x10, 1x6-7 reps  Manual Therapy: Pt received STM and TPR to R > L UT and proximomedial scapular mm    02/22/24  Pt received R shoulder PROM in flexion, scaption, and ER per pt tolerance and tissue tolerance Supine serratus punches 3x10 S/L ER 3x10 Supine shoulder ABC x 1 reps   Standing rows with retraction with RTB 2x10     02/18/24 Reviewed pt presentation, pain levels, and response to prior Rx.  Pt received R shoulder PROM in flexion, scaption, and ER per pt tolerance and tissue tolerance  S/L ER 2x10 Supine serratus punches 2x10 Supine shoulder ABC x 1 reps   prone row 2x10  PT updated HEP and gave pt a HEP handout.  PT educated pt in correct form and appropriate frequency.   Manual Therapy: Pt received STM and TPR to R UT and proximomedial scapular mm    02/11/2024 Reviewed current function and pain levels.  Assessed shoulder ROM, shoulder ER strength, and special testing.  See above. Reviewed goals and educated pt concerning progress and objective findings. Pt completed UEFI.  Pt received STM to R UT in sitting to improve soft tissue tightness and mobility and reduce muscle spasm for improved usage of R UE.  PT used kinesiotape on R shoulder and proximal biceps with 2 strips crossing.  PT  instructed pt in appropriate wear time and to remove the tape if it irritates her skin.      PATIENT EDUCATION: Education details:  exercise form, dx, prognosis, rationale of interventions, and POC. Person educated: Patient Education method: Explanation, Demonstration, Tactile cues, Verbal cues, Education comprehension: verbalized understanding, returned demonstration, verbal cues required, tactile cues required, and needs further education  HOME EXERCISE PROGRAM: Access Code: [email or web address] URL: [email or web address] Date: 12/23/2023 Prepared by: Mose Minerva  Exercises - Seated  Scapular Retraction  - 2 x daily - 7 x weekly - 2 sets - 10 reps - 3 seconds hold   - Seated Shoulder Flexion Towel Slide at Table Top  - 1-2 x daily - 7 x weekly - 1-2 sets - 10 reps - Supine Shoulder Flexion AAROM   - 1 x daily - 7 x weekly - 3 sets - 10 reps - Sidelying Shoulder External Rotation  - 1 x daily - 7 x weekly - 2 sets - 10 reps - Isometric Shoulder Flexion at Wall  - 1 x daily - 5 x weekly - 2 sets - 10 reps - 5 seconds hold - Supine Single Arm Shoulder Protraction  - 1 x daily - 7 x weekly - 2 sets - 10 reps - Supine Shoulder Alphabet  - 1 x daily - 7 x weekly - 1 reps  ASSESSMENT:  CLINICAL IMPRESSION: PT progressed intensity of exercises today with adding resistance to exercises.  Pt was fatigued with exercises especially standing ER with YTB.  Pt also required 2 rest breaks with supine shoulder ABC with 1#.  Pt tolerated shoulder PROM well.  Pt has improved soft tissue tightness and trigger points in UT.  She responded well to Rx having no pain after  Rx.  Pt should benefit from cont skilled PT to address ongoing goals and impairments and to restore desired level of function.     OBJECTIVE IMPAIRMENTS: decreased activity tolerance, decreased ROM, decreased strength, hypomobility, increased muscle spasms, impaired flexibility, impaired UE functional use, and pain.   ACTIVITY LIMITATIONS: carrying, lifting, sleeping, bathing, dressing, reach over head, and hygiene/grooming  PARTICIPATION LIMITATIONS: cleaning, driving, shopping, community activity, and occupation  PERSONAL FACTORS: 1 comorbidity: DM type 2 are also affecting patient's functional outcome.   REHAB POTENTIAL: Good  CLINICAL DECISION MAKING: Stable/uncomplicated  EVALUATION COMPLEXITY: Low   GOALS:   SHORT TERM GOALS: Target date: 01/13/2024   Pt will be independent and compliant with HEP for improved ROM, pain, strength, and function.  Baseline: Goal status: IN PROGRESS - 02/03/24  2.  Pt will  report at least a 25% improvement in pain and performance of ADL's.   Baseline: 40% Goal status: MET - 02/03/24  3.  Pt will be able to drive without increased pain.  Baseline: painful with driving in reverse Goal status: Partially MET 02/03/24 Target date:  01/20/2024  4.  Pt will demo at least a 30 deg increase in flexion, abd, and scaption AROM and a 25 deg increase in ER AROM for improved reaching and performance of daily activities.  Baseline:  Goal status:  75% MET   02/11/24    LONG TERM GOALS: Target date: 03/24/2024  Pt will be able to reach overhead into a cabinet without difficulty and significant pain.  Baseline:  Goal status: Partially Met -02/03/24  2.  Pt will be able to perform her work activities without significant pain.  Baseline:  Goal status: NOT MET  3.  Pt will be able to perform self care activities/ADL's without difficulty and significant pain.   Baseline:  Goal status: PROGRESSING  4.  Pt will be able to perform her normal reaching activities without significant pain Baseline:  Goal status: PROGRESSING  5.  Pt will demo R shoulder AROM to be [facility] t/o for performance of ADLs and IADLs.  Baseline:  Goal status: PROGRESSING    PLAN:  PT FREQUENCY: 2x/week  PT DURATION: 6 weeks  PLANNED INTERVENTIONS: 97164- PT Re-evaluation, 97750- Physical Performance Testing, 97110-Therapeutic exercises, 97530- Therapeutic activity, [id number]- Neuromuscular re-education, 97535- Self Care, [id number]- Manual therapy, [id number]- Aquatic Therapy, H9716- Electrical stimulation (unattended), [id number]- Electrical stimulation (manual), 97035- Ultrasound, Patient/Family education, Taping, Dry Needling, Joint mobilization, Spinal mobilization, Cryotherapy, and Moist heat  PLAN FOR NEXT SESSION: STM to UT.  Shoulder ROM.  Scapular exercises per pt tolerance. Cont with taping.   Leigh Minerva III PT, DPT 03/02/24 11:07 PM     [facility] [address] [place], [place], [address] Phone: [phone]   Fax:  [phone]

## 2024-03-03 ENCOUNTER — Encounter (HOSPITAL_BASED_OUTPATIENT_CLINIC_OR_DEPARTMENT_OTHER): Admitting: Physical Therapy

## 2024-03-04 ENCOUNTER — Ambulatory Visit (INDEPENDENT_AMBULATORY_CARE_PROVIDER_SITE_OTHER): Admitting: Neurology

## 2024-03-04 DIAGNOSIS — R634 Abnormal weight loss: Secondary | ICD-10-CM

## 2024-03-04 DIAGNOSIS — G4733 Obstructive sleep apnea (adult) (pediatric): Secondary | ICD-10-CM

## 2024-03-07 DIAGNOSIS — G4733 Obstructive sleep apnea (adult) (pediatric): Secondary | ICD-10-CM | POA: Diagnosis not present

## 2024-03-08 ENCOUNTER — Encounter: Payer: Self-pay | Admitting: Allergy and Immunology

## 2024-03-09 ENCOUNTER — Ambulatory Visit (HOSPITAL_BASED_OUTPATIENT_CLINIC_OR_DEPARTMENT_OTHER): Payer: Self-pay | Attending: Sports Medicine | Admitting: Physical Therapy

## 2024-03-09 DIAGNOSIS — M25511 Pain in right shoulder: Secondary | ICD-10-CM | POA: Insufficient documentation

## 2024-03-09 DIAGNOSIS — M25611 Stiffness of right shoulder, not elsewhere classified: Secondary | ICD-10-CM | POA: Insufficient documentation

## 2024-03-09 DIAGNOSIS — M6281 Muscle weakness (generalized): Secondary | ICD-10-CM | POA: Diagnosis not present

## 2024-03-09 NOTE — Therapy (Signed)
 OUTPATIENT PHYSICAL THERAPY SHOULDER TREATMENT    Patient Name: Aimee Hall MRN: [id number] DOB:1971-04-02, 52 y.o., female Today's Date: 03/10/2024  END OF SESSION:  PT End of Session - 03/10/24 2340     Visit Number 10    Number of Visits 18    Date for PT Re-Evaluation 03/24/24    Authorization Type BCBS    PT Start Time 1630    PT Stop Time 1710    PT Time Calculation (min) 40 min    Activity Tolerance Patient tolerated treatment well    Behavior During Therapy WFL for tasks assessed/performed             Past Medical History:  Diagnosis Date   Asthma    Dehydration    Diabetes mellitus without complication (HCC)    Eczema    GERD (gastroesophageal reflux disease)    Syncopal episodes    Urticaria    Past Surgical History:  Procedure Laterality Date   NO PAST SURGERIES     Patient Active Problem List   Diagnosis Date Noted   Chronic migraine without aura, with intractable migraine, so stated, with status migrainosus 02/16/2023    PCP: Alvera Reagin, PA  REFERRING PROVIDER: Dasie Fitch, MD  REFERRING DIAG: M75.21 (ICD-10-CM) - Bicipital tendinitis, right shoulder  THERAPY DIAG:  Right shoulder pain, unspecified chronicity  Stiffness of right shoulder, not elsewhere classified  Muscle weakness (generalized)  Rationale for Evaluation and Treatment: Rehabilitation  ONSET DATE: Nov 2024  SUBJECTIVE:                                                                                                                                                                                      SUBJECTIVE STATEMENT: Pt states she had increased shoulder pain 2 days ago when pushing the soap dispenser at work.  She hz adducts her R shoulder to push the soap dispenser.  Pt was having no pain before she pushed the soap dispenser.  Pt denies any adverse effects after prior Rx.  Pt denies pain currently.  Pt reports compliance with HEP.  Pt reports having 4 allergic  reactions at work in the past 3 weeks.  She states her whole body is tired.     Hand dominance: Right  PERTINENT HISTORY: Bilat hip pain DM type 2, obesity, migraine/HA's, anxiety  PAIN:  NPRS:  0/10 current at rest, 6-7/10 pain with pushing the dispenser Location:  anterior R shoulder, biceps origin, UT Type:  dull at rest, sharp with movement.  constant  PRECAUTIONS: None    WEIGHT BEARING RESTRICTIONS: No  FALLS:  Has patient fallen in last 6 months? 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  DIAGNOSTIC FINDINGS:  Pt had x rays though PT unable to view them.  MRI: FINDINGS:   Bones: Moderate AC joint arthrosis is present with reactive edema and mild hypertrophy. Mild glenohumeral arthrosis is present. No significant joint effusion. There is an indeterminate bubbly cystic lesion in the posterior lateral humeral head measuring 1.5 cm in maximum size. This could be a benign cyst or possibly chondroid lesion. No aggressive features are present. Correlation with x-ray recommended to evaluate for possible calcific matrix. Otherwise, the bones are unremarkable. There is no fracture or contusion pattern.   Rotator cuff: There is mild insertional tendinosis of the supraspinatus and infraspinatus tendons. There is no significant partial or full-thickness tear. There is a mild intrasubstance partial tear of the supraspinatus tendon. No full-thickness tear is present. No significant fatty atrophy of the rotator cuff muscles. The subscapularis and teres minor tendons are unremarkable.   Labrum and biceps tendon: Biceps tendon is intact. There is mild blunting  of the superior labrum without a well defined superior labral tear. The anterior and posterior labrum are unremarkable.     IMPRESSION: Moderate AC joint arthrosis with reactive edema and mild hypertrophy. Correlation for [facility] joint symptoms.   Mild insertional tendinosis of the supraspinatus tendon with a mild interstitial partial tear. No significant partial or full-thickness rotator cuff tear is present.   Biceps tendon is intact.   Slight blunting of the superior labrum without evidence of a discrete superior labral tear.  PATIENT SURVEYS:  UEFI:  36/80  COGNITION: Overall cognitive status: Within functional limits for tasks assessed      UPPER EXTREMITY ROM:   AROM/PROM Right eval Left eval Right 6/9 Right 7/2 Right 7/10  Shoulder flexion 62/83 with pain 154 129  148  Shoulder scaption 83 with pain 150   133  Shoulder abduction 69 with pain 112 with pain  109 92  Shoulder adduction       Shoulder internal rotation       Shoulder external rotation 13/18 51 47  38/50  Elbow flexion       Elbow extension       Wrist flexion       Wrist extension       Wrist ulnar deviation       Wrist radial deviation       Wrist pronation       Wrist supination       (Blank rows = not tested)  UPPER EXTREMITY MMT:  MMT Right eval Left eval Right 7/10  Shoulder flexion     Shoulder extension     Shoulder abduction     Shoulder adduction     Shoulder internal rotation     Shoulder external rotation   5/5  Middle trapezius     Lower trapezius     Elbow flexion     Elbow extension     Wrist flexion     Wrist extension     Wrist ulnar deviation     Wrist radial deviation     Wrist pronation     Wrist supination     Grip strength (lbs)     (  Blank rows = not tested)  SHOULDER SPECIAL TESTS: Yergason's Test:  R: negative, L:  negative  Speed's Test:  R:  positive, L: negative                                                                                                                                 TREATMENT DATE:   03/09/24:  Therapeutic Exercise:  Pt received R shoulder PROM in flexion, abd, ER, and IR in supine per pt and tissue tolerance.   Pulleys in flexion x 20 reps  Supine serratus punch 2# 3x10 Supine shoulder ABC x 1 rep with 1# S/L ER  2x10 Prone row 1# x12, 2# x10 Prone shoulder extension to neutral 2 x 10 reps   Manual Therapy: Pt received STM and TPR to R > L UT and R sided medial scapular mm  03/01/24:  Therapeutic Exercise:  Pt received R shoulder PROM in flexion, abd, and ER in supine per pt and tissue tolerance.   Supine serratus punch2# 2x10 Supine shoulder ABC x 1 rep with 1# S/L ER  2x10 Standing rows with retraction with RTB 2x10 Standing shoulder extension with retraction with RTB 2x10 Standing ER with YTB with arm at side 1x10, 1x6-7 reps  Manual Therapy: Pt received STM and TPR to R > L UT and proximomedial scapular mm    02/22/24  Pt received R shoulder PROM in flexion, scaption, and ER per pt tolerance and tissue tolerance Supine serratus punches 3x10 S/L ER 3x10 Supine shoulder ABC x 1 reps   Standing rows with retraction with RTB 2x10     02/18/24 Reviewed pt presentation, pain levels, and response to prior Rx.  Pt received R shoulder PROM in flexion, scaption, and ER per pt tolerance and tissue tolerance  S/L ER 2x10 Supine serratus punches 2x10 Supine shoulder ABC x 1 reps   prone row 2x10  PT updated HEP and gave pt a HEP handout.  PT educated pt in correct form and appropriate frequency.   Manual Therapy: Pt received STM and TPR to R UT and proximomedial scapular mm    02/11/2024 Reviewed current function and pain levels.  Assessed shoulder ROM, shoulder ER strength, and special testing.  See above. Reviewed goals and educated pt concerning progress and objective findings. Pt completed UEFI.  Pt received STM to R UT in sitting to improve soft tissue tightness and mobility  and reduce muscle spasm for improved usage of R UE.  PT used kinesiotape on R shoulder and proximal biceps with 2 strips crossing.  PT  instructed pt in appropriate wear time and to remove the tape if it irritates her skin.      PATIENT EDUCATION: Education details:  exercise form, dx, prognosis, rationale of interventions, and POC. Person educated: Patient Education method: Explanation, Demonstration, Tactile cues, Verbal cues, Education comprehension: verbalized understanding, returned demonstration, verbal cues required, tactile cues required, and needs further education  HOME  EXERCISE PROGRAM: Access Code: [email or web address] URL: [email or web address] Date: 12/23/2023 Prepared by: Mose Minerva  Exercises - Seated Scapular Retraction  - 2 x daily - 7 x weekly - 2 sets - 10 reps - 3 seconds hold   - Seated Shoulder Flexion Towel Slide at Table Top  - 1-2 x daily - 7 x weekly - 1-2 sets - 10 reps - Supine Shoulder Flexion AAROM   - 1 x daily - 7 x weekly - 3 sets - 10 reps - Sidelying Shoulder External Rotation  - 1 x daily - 7 x weekly - 2 sets - 10 reps - Isometric Shoulder Flexion at Wall  - 1 x daily - 5 x weekly - 2 sets - 10 reps - 5 seconds hold - Supine Single Arm Shoulder Protraction  - 1 x daily - 7 x weekly - 2 sets - 10 reps - Supine Shoulder Alphabet  - 1 x daily - 7 x weekly - 1 reps  ASSESSMENT:  CLINICAL IMPRESSION: Pt has no pain currently at rest though has had some pain this week after reaching across body and pushing the soap dispenser at work.  Pt reports pain with hz adducting R UE.  Pt had increased soft tissue tightness and trigger points in UT today.  Pt performed exercises well and was fatigued with exercises.  She responded well to Rx having no pain after Rx.  Pt should benefit from cont skilled PT to address ongoing goals and impairments and to restore desired level of function.     OBJECTIVE IMPAIRMENTS: decreased activity tolerance, decreased ROM,  decreased strength, hypomobility, increased muscle spasms, impaired flexibility, impaired UE functional use, and pain.   ACTIVITY LIMITATIONS: carrying, lifting, sleeping, bathing, dressing, reach over head, and hygiene/grooming  PARTICIPATION LIMITATIONS: cleaning, driving, shopping, community activity, and occupation  PERSONAL FACTORS: 1 comorbidity: DM type 2 are also affecting patient's functional outcome.   REHAB POTENTIAL: Good  CLINICAL DECISION MAKING: Stable/uncomplicated  EVALUATION COMPLEXITY: Low   GOALS:   SHORT TERM GOALS: Target date: 01/13/2024   Pt will be independent and compliant with HEP for improved ROM, pain, strength, and function.  Baseline: Goal status: IN PROGRESS - 02/03/24  2.  Pt will report at least a 25% improvement in pain and performance of ADL's.   Baseline: 40% Goal status: MET - 02/03/24  3.  Pt will be able to drive without increased pain.  Baseline: painful with driving in reverse Goal status: Partially MET 02/03/24 Target date:  01/20/2024  4.  Pt will demo at least a 30 deg increase in flexion, abd, and scaption AROM and a 25 deg increase in ER AROM for improved reaching and performance of daily activities.  Baseline:  Goal status:  75% MET   02/11/24    LONG TERM GOALS: Target date: 03/24/2024  Pt will be able to reach overhead into a cabinet without difficulty and significant pain.  Baseline:  Goal status: Partially Met -02/03/24  2.  Pt will be able to perform her work activities without significant pain.  Baseline:  Goal status: NOT MET  3.  Pt will be able to perform self care activities/ADL's without difficulty and significant pain.   Baseline:  Goal status: PROGRESSING  4.  Pt will be able to perform her normal reaching activities without significant pain Baseline:  Goal status: PROGRESSING  5.  Pt will demo R shoulder AROM to be [facility] t/o for performance of ADLs and IADLs.  Baseline:  Goal status:  PROGRESSING  PLAN:  PT FREQUENCY: 2x/week  PT DURATION: 6 weeks  PLANNED INTERVENTIONS: 97164- PT Re-evaluation, 97750- Physical Performance Testing, 97110-Therapeutic exercises, 97530- Therapeutic activity, [id number]- Neuromuscular re-education, 97535- Self Care, 02859- Manual therapy, [id number]- Aquatic Therapy, H9716- Electrical stimulation (unattended), [id number]- Electrical stimulation (manual), 97035- Ultrasound, Patient/Family education, Taping, Dry Needling, Joint mobilization, Spinal mobilization, Cryotherapy, and Moist heat  PLAN FOR NEXT SESSION: STM to UT and periscap mm.  Shoulder ROM.  Cont with strengthening per pt tolerance.  Cont with taping as needed.   Leigh Minerva III PT, DPT 03/11/24 7:07 AM   [facility] [address] [place], [place], [address] Phone: [phone]   Fax:  [phone]

## 2024-03-10 ENCOUNTER — Encounter (HOSPITAL_BASED_OUTPATIENT_CLINIC_OR_DEPARTMENT_OTHER): Payer: Self-pay | Admitting: Physical Therapy

## 2024-03-10 ENCOUNTER — Telehealth: Payer: Self-pay | Admitting: Allergy and Immunology

## 2024-03-10 ENCOUNTER — Other Ambulatory Visit (HOSPITAL_BASED_OUTPATIENT_CLINIC_OR_DEPARTMENT_OTHER): Payer: Self-pay

## 2024-03-10 DIAGNOSIS — E1169 Type 2 diabetes mellitus with other specified complication: Secondary | ICD-10-CM | POA: Diagnosis not present

## 2024-03-10 DIAGNOSIS — K5909 Other constipation: Secondary | ICD-10-CM | POA: Diagnosis not present

## 2024-03-10 DIAGNOSIS — E559 Vitamin D deficiency, unspecified: Secondary | ICD-10-CM | POA: Diagnosis not present

## 2024-03-10 DIAGNOSIS — G43909 Migraine, unspecified, not intractable, without status migrainosus: Secondary | ICD-10-CM | POA: Diagnosis not present

## 2024-03-10 DIAGNOSIS — E7800 Pure hypercholesterolemia, unspecified: Secondary | ICD-10-CM | POA: Diagnosis not present

## 2024-03-10 MED ORDER — MOUNJARO 5 MG/0.5ML ~~LOC~~ SOAJ
5.0000 mg | SUBCUTANEOUS | 1 refills | Status: DC
  Filled 2024-03-27: qty 2, 28d supply, fill #0
  Filled 2024-04-18: qty 2, 28d supply, fill #1

## 2024-03-10 NOTE — Telephone Encounter (Signed)
 Pt came in and brought PW to be filled out for [facility]. Stated she would pay at pick up. Best number to contact [phone]. Placed papers in Pending Forms folder on the nurses desk.

## 2024-03-11 NOTE — Telephone Encounter (Signed)
 Forms have been placed on Dr. Rowan desk to be completed.

## 2024-03-15 DIAGNOSIS — M25551 Pain in right hip: Secondary | ICD-10-CM | POA: Diagnosis not present

## 2024-03-15 DIAGNOSIS — M25511 Pain in right shoulder: Secondary | ICD-10-CM | POA: Diagnosis not present

## 2024-03-16 NOTE — Telephone Encounter (Addendum)
 FMLA paperwork has been review/completed/signed - ready for p/u on [address] side.  Called patient - DOB/DPR verified - advised of notation above - $25 fee for processing paperwork.  Patient stated she will pay fee when picking up paperwork.

## 2024-03-17 DIAGNOSIS — J309 Allergic rhinitis, unspecified: Secondary | ICD-10-CM

## 2024-03-17 NOTE — Telephone Encounter (Signed)
 Patient paid $25 fee for the FMLA forms.

## 2024-03-21 DIAGNOSIS — H35033 Hypertensive retinopathy, bilateral: Secondary | ICD-10-CM | POA: Diagnosis not present

## 2024-03-21 DIAGNOSIS — E7849 Other hyperlipidemia: Secondary | ICD-10-CM | POA: Diagnosis not present

## 2024-03-21 DIAGNOSIS — E559 Vitamin D deficiency, unspecified: Secondary | ICD-10-CM | POA: Diagnosis not present

## 2024-03-21 DIAGNOSIS — E1169 Type 2 diabetes mellitus with other specified complication: Secondary | ICD-10-CM | POA: Diagnosis not present

## 2024-03-21 DIAGNOSIS — G43909 Migraine, unspecified, not intractable, without status migrainosus: Secondary | ICD-10-CM | POA: Diagnosis not present

## 2024-03-21 DIAGNOSIS — J302 Other seasonal allergic rhinitis: Secondary | ICD-10-CM | POA: Diagnosis not present

## 2024-03-21 DIAGNOSIS — J45909 Unspecified asthma, uncomplicated: Secondary | ICD-10-CM | POA: Diagnosis not present

## 2024-03-23 ENCOUNTER — Ambulatory Visit (HOSPITAL_BASED_OUTPATIENT_CLINIC_OR_DEPARTMENT_OTHER): Payer: Self-pay | Admitting: Physical Therapy

## 2024-03-24 NOTE — Progress Notes (Signed)
 See procedure note.

## 2024-03-24 NOTE — Procedures (Signed)
 Aimee Hall  HOME SLEEP TEST (SANSA) REPORT (Mail-Out Device):   STUDY DATE: 03/12/2024  DOB: [birth year]  MRN: [id number]  ORDERING CLINICIAN: True Mar, MD, PhD   REFERRING CLINICIAN: Greig Forbes, NP  CLINICAL INFORMATION/HISTORY: 53 year old female with an underlying medical history of asthma, diabetes, reflux disease, history of syncope, migraine headaches, and severe obesity with a BMI of over 40, who presents for reevaluation of her obstructive sleep apnea.  She has been on home AutoPap therapy but has had significant weight loss in the recent few months.  BMI (at the time of sleep clinic visit and/or test date): 40.5 kg/m  FINDINGS:   Study Protocol:    The SANSA single-point-of-skin-contact chest-worn sensor - an FDA cleared and DOT approved type 4 home sleep test device - measures eight physiological channels,  including blood oxygen saturation (measured via PPG [photoplethysmography]), EKG-derived heart rate, respiratory effort, chest movement (measured via accelerometer), snoring, body position, and actigraphy. The device is designed to be worn for up to 10 hours per study.   Sleep Summary:   Total Recording Time (hours, min): 9 hours, 18 min  Total Effective Sleep Time (hours, min):  6 hours, 30 min  Sleep Efficiency (%):    70%   Respiratory Indices:   Calculated sAHI (per hour):  4.9/hour         Oxygen Saturation Statistics:    Oxygen Saturation (%) Mean: 97.4%   Minimum oxygen saturation (%):                 69.4%   O2 Saturation Range (%): 69.4-100%   Time below or at 88% saturation: 1 min   Pulse Rate Statistics:   Pulse Mean (bpm):    73/min    Pulse Range (61-112/min)   Snoring: Intermittent, mild to moderate  IMPRESSION/DIAGNOSES:   Primary snoring   RECOMMENDATIONS:   This home sleep test does not demonstrate any significant obstructive disordered breathing with a total AHI of less than 5/hour.  The patient's AHI  was 4.9/h which is borderline, O2 nadir 69.4% but this could essentially be an error during wakefulness.  I would recommend making sure that the patient did not use her AutoPap machine during the night of this test as it would underestimate her sleep disordered breathing.  She does have a prior diagnosis of severe obstructive sleep apnea.  If she did this home sleep test without AutoPap therapy in place and if there is still clinical concern for residual obstructive sleep apnea, a laboratory attended sleep study could be requested.  Based on the current test results treatment with a positive airway pressure device is no longer necessary (again if the patient indeed did this test without her AutoPap in place.). For disturbing snoring, an oral appliance through dentistry or orthodontics can be considered.  Other causes of the patient's symptoms, including circadian rhythm disturbances, an underlying mood disorder, medication effect and/or an underlying medical problem cannot be ruled out based on this test. Clinical correlation is recommended.  The patient should be cautioned not to drive, work at heights, or operate dangerous or heavy equipment when tired or sleepy. Review and reiteration of good sleep hygiene measures should be pursued with any patient. The patient will be advised to follow up with her referring provider, who will be notified of the test results.   I certify that I have reviewed the raw data recording prior to the issuance of this report in accordance with the standards of the American Academy  of Sleep Medicine (AASM).    INTERPRETING PHYSICIAN:   True Mar, MD, PhD Medical Director, [facility]) Diplomat, ABPN (Neurology and Sleep)   [facility] [address] [place], [place] [address] [phone]

## 2024-03-25 ENCOUNTER — Ambulatory Visit: Payer: Self-pay | Admitting: Family Medicine

## 2024-03-25 DIAGNOSIS — R0902 Hypoxemia: Secondary | ICD-10-CM

## 2024-03-25 DIAGNOSIS — Z8669 Personal history of other diseases of the nervous system and sense organs: Secondary | ICD-10-CM

## 2024-03-28 ENCOUNTER — Other Ambulatory Visit (HOSPITAL_BASED_OUTPATIENT_CLINIC_OR_DEPARTMENT_OTHER): Payer: Self-pay

## 2024-03-28 ENCOUNTER — Encounter (HOSPITAL_BASED_OUTPATIENT_CLINIC_OR_DEPARTMENT_OTHER): Payer: Self-pay | Admitting: Physical Therapy

## 2024-03-30 ENCOUNTER — Other Ambulatory Visit (HOSPITAL_BASED_OUTPATIENT_CLINIC_OR_DEPARTMENT_OTHER): Payer: Self-pay

## 2024-03-30 ENCOUNTER — Ambulatory Visit (HOSPITAL_BASED_OUTPATIENT_CLINIC_OR_DEPARTMENT_OTHER): Payer: Self-pay | Admitting: Physical Therapy

## 2024-03-30 DIAGNOSIS — Z91018 Allergy to other foods: Secondary | ICD-10-CM | POA: Diagnosis not present

## 2024-03-30 DIAGNOSIS — Z9109 Other allergy status, other than to drugs and biological substances: Secondary | ICD-10-CM | POA: Diagnosis not present

## 2024-03-30 DIAGNOSIS — M6281 Muscle weakness (generalized): Secondary | ICD-10-CM | POA: Diagnosis not present

## 2024-03-30 DIAGNOSIS — M25611 Stiffness of right shoulder, not elsewhere classified: Secondary | ICD-10-CM

## 2024-03-30 DIAGNOSIS — G4700 Insomnia, unspecified: Secondary | ICD-10-CM | POA: Diagnosis not present

## 2024-03-30 DIAGNOSIS — J45909 Unspecified asthma, uncomplicated: Secondary | ICD-10-CM | POA: Diagnosis not present

## 2024-03-30 DIAGNOSIS — M25511 Pain in right shoulder: Secondary | ICD-10-CM | POA: Diagnosis not present

## 2024-03-30 MED ORDER — TRAZODONE HCL 50 MG PO TABS
50.0000 mg | ORAL_TABLET | Freq: Every evening | ORAL | 1 refills | Status: AC | PRN
  Filled 2024-03-30: qty 60, 30d supply, fill #0

## 2024-03-30 NOTE — Therapy (Signed)
 OUTPATIENT PHYSICAL THERAPY SHOULDER TREATMENT    Patient Name: Aimee Hall MRN: [id number] DOB:[birth year], 53 y.o., female Today's Date: 03/30/2024  END OF SESSION:       Past Medical History:  Diagnosis Date   Asthma    Dehydration    Diabetes mellitus without complication (HCC)    Eczema    GERD (gastroesophageal reflux disease)    Syncopal episodes    Urticaria    Past Surgical History:  Procedure Laterality Date   NO PAST SURGERIES     Patient Active Problem List   Diagnosis Date Noted   Chronic migraine without aura, with intractable migraine, so stated, with status migrainosus 02/16/2023    PCP: Alvera Reagin, PA  REFERRING PROVIDER: Dasie Fitch, MD  REFERRING DIAG: M75.21 (ICD-10-CM) - Bicipital tendinitis, right shoulder  THERAPY DIAG:  No diagnosis found.  Rationale for Evaluation and Treatment: Rehabilitation  ONSET DATE: Nov 2024  SUBJECTIVE:                                                                                                                                                                                      SUBJECTIVE STATEMENT: Pt states her shoulder pain overall is better though the pinching pain is worse.  She continues to have the pinching pain when reaching across her body to push the soap dispenser at work.  She has increased pain with computer work including using her mouse.  Pt was having no pain before she pushed the soap dispenser.    Pt states she felt good after prior Rx.  Pt states she sleeps better after soft tissue work.  Pt states she needs the soft tissue release today.  Pt reports compliance with HEP. Pt has been unable to attend PT due to the clinic's schedule availability and pt's schedule.  She is back teaching now.     --Pt reports having 4 allergic reactions at work in the past 3 weeks.  She states her whole body is tired.     Hand dominance: Right  PERTINENT HISTORY: Bilat hip pain DM type 2,  obesity, migraine/HA's, anxiety  PAIN:  NPRS:  5/10 current at rest, 6-7/10 pain with pushing the dispenser Location:  anterior R shoulder, biceps origin, UT Type:  dull at rest, sharp with movement.  constant  PRECAUTIONS: None    WEIGHT BEARING RESTRICTIONS: No  FALLS:  Has patient fallen in last 6 months? 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  DIAGNOSTIC FINDINGS:  Pt had x rays though PT unable to view them.  MRI: FINDINGS:   Bones: Moderate AC joint arthrosis is present with reactive edema and mild hypertrophy. Mild glenohumeral arthrosis is present. No significant joint effusion. There is an indeterminate bubbly cystic lesion in the posterior lateral humeral head measuring 1.5 cm in maximum size. This could be a benign cyst or possibly chondroid lesion. No aggressive features are present. Correlation with x-ray recommended to evaluate for possible calcific matrix. Otherwise, the bones are unremarkable. There is no fracture or contusion pattern.   Rotator cuff: There is mild insertional tendinosis of the supraspinatus and infraspinatus tendons. There is no significant partial or full-thickness tear. There is a mild intrasubstance partial tear of the supraspinatus tendon. No full-thickness tear is present. No significant fatty atrophy of the rotator cuff muscles. The subscapularis and teres minor tendons are unremarkable.   Labrum and biceps tendon: Biceps tendon is intact. There is mild blunting of the superior labrum without a well defined superior labral tear. The anterior and posterior labrum are unremarkable.     IMPRESSION: Moderate AC joint  arthrosis with reactive edema and mild hypertrophy. Correlation for [facility] joint symptoms.   Mild insertional tendinosis of the supraspinatus tendon with a mild interstitial partial tear. No significant partial or full-thickness rotator cuff tear is present.   Biceps tendon is intact.   Slight blunting of the superior labrum without evidence of a discrete superior labral tear.  PATIENT SURVEYS:  UEFI:  36/80  COGNITION: Overall cognitive status: Within functional limits for tasks assessed      UPPER EXTREMITY ROM:   AROM/PROM Right eval Left eval Right 6/9 Right 7/2 Right 7/10 Right 8/27  Shoulder flexion 62/83 with pain 154 129  148 150  Shoulder scaption 83 with pain 150   133 145  Shoulder abduction 69 with pain 112 with pain  109 92 105  Shoulder adduction        Shoulder internal rotation        Shoulder external rotation 13/18 51 47  38/50 52  Elbow flexion        Elbow extension        Wrist flexion        Wrist extension        Wrist ulnar deviation        Wrist radial deviation        Wrist pronation        Wrist supination        (Blank rows = not tested)  UPPER EXTREMITY MMT:  MMT Right eval Left eval Right 7/10 Right 8/27  Shoulder flexion    4/5  Shoulder extension      Shoulder abduction      Shoulder adduction      Shoulder internal rotation    4/5  Shoulder external rotation   5/5   Middle trapezius      Lower trapezius      Elbow flexion      Elbow extension      Wrist flexion      Wrist extension      Wrist ulnar deviation      Wrist radial deviation      Wrist pronation      Wrist supination      Grip strength (lbs)      (Blank  rows = not tested)  SHOULDER SPECIAL TESTS: Yergason's Test:  R: negative, L:  negative  Speed's Test:  R:  positive, L: negative                                                                                                                                TREATMENT DATE:   03/30/24: Reviewed pt  presentation, pain levels, and response to prior Rx.   Assessed ROM, strength. Standing rows with GTB x 10, RTB x 10 Standing shoulder extension with RTB  ER with arm at side with YTB approx 7 reps S/L ER with 1#  03/09/24:  Therapeutic Exercise:  Pt received R shoulder PROM in flexion, abd, ER, and IR in supine per pt and tissue tolerance.   Pulleys in flexion x 20 reps  Supine serratus punch 2# 3x10 Supine shoulder ABC x 1 rep with 1# S/L ER  2x10 Prone row 1# x12, 2# x10 Prone shoulder extension to neutral 2 x 10 reps   Manual Therapy: Pt received STM and TPR to R > L UT and R sided medial scapular mm  03/01/24:  Therapeutic Exercise:  Pt received R shoulder PROM in flexion, abd, and ER in supine per pt and tissue tolerance.   Supine serratus punch2# 2x10 Supine shoulder ABC x 1 rep with 1# S/L ER  2x10 Standing rows with retraction with RTB 2x10 Standing shoulder extension with retraction with RTB 2x10 Standing ER with YTB with arm at side 1x10, 1x6-7 reps  Manual Therapy: Pt received STM and TPR to R > L UT and proximomedial scapular mm    02/22/24  Pt received R shoulder PROM in flexion, scaption, and ER per pt tolerance and tissue tolerance Supine serratus punches 3x10 S/L ER 3x10 Supine shoulder ABC x 1 reps   Standing rows with retraction with RTB 2x10     02/18/24 Reviewed pt presentation, pain levels, and response to prior Rx.  Pt received R shoulder PROM in flexion, scaption, and ER per pt tolerance and tissue tolerance  S/L ER 2x10 Supine serratus punches 2x10 Supine shoulder ABC x 1 reps   prone row 2x10  PT updated HEP and gave pt a HEP handout.  PT educated pt in correct form and appropriate frequency.   Manual Therapy: Pt received STM and TPR to R UT and proximomedial scapular mm    02/11/2024 Reviewed current function and pain levels.  Assessed shoulder ROM, shoulder ER strength, and special testing.  See above. Reviewed goals and  educated pt concerning progress and objective findings. Pt completed UEFI.  Pt received STM to R UT in sitting to improve soft tissue tightness and mobility and reduce muscle spasm for improved usage of R UE.  PT used kinesiotape on R shoulder and proximal biceps with 2 strips crossing.  PT  instructed pt in appropriate wear time and to remove the tape if it irritates her skin.  PATIENT EDUCATION: Education details:  exercise form, dx, prognosis, rationale of interventions, and POC. Person educated: Patient Education method: Explanation, Demonstration, Tactile cues, Verbal cues, Education comprehension: verbalized understanding, returned demonstration, verbal cues required, tactile cues required, and needs further education  HOME EXERCISE PROGRAM: Access Code: [email or web address] URL: [email or web address] Date: 12/23/2023 Prepared by: Mose Minerva  Exercises - Seated Scapular Retraction  - 2 x daily - 7 x weekly - 2 sets - 10 reps - 3 seconds hold   - Seated Shoulder Flexion Towel Slide at Table Top  - 1-2 x daily - 7 x weekly - 1-2 sets - 10 reps - Supine Shoulder Flexion AAROM   - 1 x daily - 7 x weekly - 3 sets - 10 reps - Sidelying Shoulder External Rotation  - 1 x daily - 7 x weekly - 2 sets - 10 reps - Isometric Shoulder Flexion at Wall  - 1 x daily - 5 x weekly - 2 sets - 10 reps - 5 seconds hold - Supine Single Arm Shoulder Protraction  - 1 x daily - 7 x weekly - 2 sets - 10 reps - Supine Shoulder Alphabet  - 1 x daily - 7 x weekly - 1 reps  ASSESSMENT:  CLINICAL IMPRESSION: Pt has no pain currently at rest though has had some pain this week after reaching across body and pushing the soap dispenser at work.  Pt reports pain with hz adducting R UE.  Pt had increased soft tissue tightness and trigger points in UT today.  Pt performed exercises well and was fatigued with exercises.  She responded well to Rx having no pain after Rx.  Pt should benefit from cont skilled  PT to address ongoing goals and impairments and to restore desired level of function.     Pt had UT pain with Standing ER with YTB.  PT had pt stop that exercise and perform S/L ER.   OBJECTIVE IMPAIRMENTS: decreased activity tolerance, decreased ROM, decreased strength, hypomobility, increased muscle spasms, impaired flexibility, impaired UE functional use, and pain.   ACTIVITY LIMITATIONS: carrying, lifting, sleeping, bathing, dressing, reach over head, and hygiene/grooming  PARTICIPATION LIMITATIONS: cleaning, driving, shopping, community activity, and occupation  PERSONAL FACTORS: 1 comorbidity: DM type 2 are also affecting patient's functional outcome.   REHAB POTENTIAL: Good  CLINICAL DECISION MAKING: Stable/uncomplicated  EVALUATION COMPLEXITY: Low   GOALS:   SHORT TERM GOALS: Target date: 01/13/2024   Pt will be independent and compliant with HEP for improved ROM, pain, strength, and function.  Baseline: Goal status: IN PROGRESS - 02/03/24  2.  Pt will report at least a 25% improvement in pain and performance of ADL's.   Baseline: 40% Goal status: MET - 02/03/24  3.  Pt will be able to drive without increased pain.  Baseline: painful with driving in reverse Goal status: Partially MET 02/03/24 Target date:  01/20/2024  4.  Pt will demo at least a 30 deg increase in flexion, abd, and scaption AROM and a 25 deg increase in ER AROM for improved reaching and performance of daily activities.  Baseline:  Goal status:  75% MET   02/11/24    LONG TERM GOALS: Target date: 03/24/2024  Pt will be able to reach overhead into a cabinet without difficulty and significant pain.  Baseline:  Goal status: Partially Met -02/03/24  2.  Pt will be able to perform her work activities without significant pain.  Baseline:  Goal status: NOT MET  3.  Pt will be  able to perform self care activities/ADL's without difficulty and significant pain.   Baseline:  Goal status: PROGRESSING  4.  Pt  will be able to perform her normal reaching activities without significant pain Baseline:  Goal status: PROGRESSING  5.  Pt will demo R shoulder AROM to be [facility] t/o for performance of ADLs and IADLs.  Baseline:  Goal status: PROGRESSING    PLAN:  PT FREQUENCY: 2x/week  PT DURATION: 6 weeks  PLANNED INTERVENTIONS: [id number]- PT Re-evaluation, 97750- Physical Performance Testing, 97110-Therapeutic exercises, 97530- Therapeutic activity, [id number]- Neuromuscular re-education, 97535- Self Care, [id number]- Manual therapy, [id number]- Aquatic Therapy, [id number]- Electrical stimulation (unattended), [id number]- Electrical stimulation (manual), 97035- Ultrasound, Patient/Family education, Taping, Dry Needling, Joint mobilization, Spinal mobilization, Cryotherapy, and Moist heat  PLAN FOR NEXT SESSION: STM to UT and periscap mm.  Shoulder ROM.  Cont with strengthening per pt tolerance.  Cont with taping as needed.   Leigh Minerva III PT, DPT 03/30/24 12:05 PM   [facility] [address] [place], [place], [address] Phone: [phone]   Fax:  [phone]

## 2024-03-31 ENCOUNTER — Encounter (HOSPITAL_BASED_OUTPATIENT_CLINIC_OR_DEPARTMENT_OTHER): Payer: Self-pay | Admitting: Physical Therapy

## 2024-04-09 DIAGNOSIS — R52 Pain, unspecified: Secondary | ICD-10-CM | POA: Diagnosis not present

## 2024-04-09 DIAGNOSIS — J45901 Unspecified asthma with (acute) exacerbation: Secondary | ICD-10-CM | POA: Diagnosis not present

## 2024-04-09 DIAGNOSIS — R059 Cough, unspecified: Secondary | ICD-10-CM | POA: Diagnosis not present

## 2024-04-09 DIAGNOSIS — U071 COVID-19: Secondary | ICD-10-CM | POA: Diagnosis not present

## 2024-04-12 ENCOUNTER — Other Ambulatory Visit (HOSPITAL_BASED_OUTPATIENT_CLINIC_OR_DEPARTMENT_OTHER): Payer: Self-pay

## 2024-04-12 ENCOUNTER — Other Ambulatory Visit: Payer: Self-pay

## 2024-04-13 DIAGNOSIS — R509 Fever, unspecified: Secondary | ICD-10-CM | POA: Diagnosis not present

## 2024-04-13 DIAGNOSIS — U071 COVID-19: Secondary | ICD-10-CM | POA: Diagnosis not present

## 2024-04-13 DIAGNOSIS — J3489 Other specified disorders of nose and nasal sinuses: Secondary | ICD-10-CM | POA: Diagnosis not present

## 2024-04-13 DIAGNOSIS — R062 Wheezing: Secondary | ICD-10-CM | POA: Diagnosis not present

## 2024-04-14 ENCOUNTER — Ambulatory Visit (HOSPITAL_BASED_OUTPATIENT_CLINIC_OR_DEPARTMENT_OTHER): Attending: Sports Medicine | Admitting: Physical Therapy

## 2024-04-14 DIAGNOSIS — M25511 Pain in right shoulder: Secondary | ICD-10-CM | POA: Insufficient documentation

## 2024-04-14 DIAGNOSIS — M25611 Stiffness of right shoulder, not elsewhere classified: Secondary | ICD-10-CM | POA: Diagnosis not present

## 2024-04-14 DIAGNOSIS — M6281 Muscle weakness (generalized): Secondary | ICD-10-CM | POA: Insufficient documentation

## 2024-04-14 NOTE — Therapy (Signed)
 OUTPATIENT PHYSICAL THERAPY SHOULDER TREATMENT     Patient Name: Aimee Hall MRN: [id number] DOB:[birth year], 53 y.o., female Today's Date: 04/15/2024  END OF SESSION:  PT End of Session - 04/14/24 1515     Visit Number 12    Number of Visits 21    Date for PT Re-Evaluation 05/05/24    Authorization Type BCBS    PT Start Time 1454    PT Stop Time 1539    PT Time Calculation (min) 45 min    Activity Tolerance Patient tolerated treatment well    Behavior During Therapy WFL for tasks assessed/performed               Past Medical History:  Diagnosis Date   Asthma    Dehydration    Diabetes mellitus without complication (HCC)    Eczema    GERD (gastroesophageal reflux disease)    Syncopal episodes    Urticaria    Past Surgical History:  Procedure Laterality Date   NO PAST SURGERIES     Patient Active Problem List   Diagnosis Date Noted   Chronic migraine without aura, with intractable migraine, so stated, with status migrainosus 02/16/2023    PCP: Alvera Reagin, PA  REFERRING PROVIDER: Dasie Fitch, MD  REFERRING DIAG: M75.21 (ICD-10-CM) - Bicipital tendinitis, right shoulder  THERAPY DIAG:  Right shoulder pain, unspecified chronicity  Stiffness of right shoulder, not elsewhere classified  Muscle weakness (generalized)  Rationale for Evaluation and Treatment: Rehabilitation  ONSET DATE: Nov 2024  SUBJECTIVE:                                                                                                                                                                                      SUBJECTIVE STATEMENT: Pt states she had Covid and was tested on Saturday.  Pt has had no fever since Monday.  Pt saw MD yesterday and was cleared.  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  Pt hasn't been performing her HEP recently due to being sick. Pt reports she felt much better after soft tissue work last visit.  Pt states she slept better after prior treatment.  She  states she needs the soft tissue release today.  Pt purchased the theracane after prior Rx.  She continues to have the pinching pain in anterior shoulder when reaching across her body.      Hand dominance: Right  PERTINENT HISTORY: Bilat hip pain DM type 2, obesity, migraine/HA's, anxiety  PAIN:  NPRS:  4/10 current at rest, 6-7/10 pain with pushing the dispenser Location:  medial and posterior scap, posterior shoulder Type:  dull at rest, sharp with movement.  constant  PRECAUTIONS: None  WEIGHT BEARING RESTRICTIONS: No  FALLS:  Has patient fallen in last 6 months? 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  DIAGNOSTIC FINDINGS:  Pt had x rays though PT unable to view them.  MRI: FINDINGS:   Bones: Moderate AC joint arthrosis is present with reactive edema and mild hypertrophy. Mild glenohumeral arthrosis is present. No significant joint effusion. There is an indeterminate bubbly cystic lesion in the posterior lateral humeral head measuring 1.5 cm in maximum size. This could be a benign cyst or possibly chondroid lesion. No aggressive features are present. Correlation with x-ray recommended to evaluate for possible calcific matrix. Otherwise, the bones are unremarkable. There is no fracture or contusion pattern.   Rotator cuff: There is mild insertional tendinosis of the supraspinatus and infraspinatus tendons. There is no significant partial or full-thickness tear. There is a mild intrasubstance partial tear of the supraspinatus tendon. No full-thickness tear is present. No significant fatty atrophy of the rotator cuff muscles. The subscapularis and  teres minor tendons are unremarkable.   Labrum and biceps tendon: Biceps tendon is intact. There is mild blunting of the superior labrum without a well defined superior labral tear. The anterior and posterior labrum are unremarkable.     IMPRESSION: Moderate AC joint arthrosis with reactive edema and mild hypertrophy. Correlation for [facility] joint symptoms.   Mild insertional tendinosis of the supraspinatus tendon with a mild interstitial partial tear. No significant partial or full-thickness rotator cuff tear is present.   Biceps tendon is intact.   Slight blunting of the superior labrum without evidence of a discrete superior labral tear.  PATIENT SURVEYS:  UEFI:  36/80  COGNITION: Overall cognitive status: Within functional limits for tasks assessed      UPPER EXTREMITY ROM:   AROM/PROM Right eval Left eval Right 6/9 Right 7/2 Right 7/10 Right 8/27  Shoulder flexion 62/83 with pain 154 129  148 150  Shoulder scaption 83 with pain 150   133 145  Shoulder abduction 69 with pain 112 with pain  109 92 105  Shoulder adduction        Shoulder internal rotation        Shoulder external rotation 13/18 51 47  38/50 52  Elbow flexion        Elbow extension        Wrist flexion        Wrist extension        Wrist ulnar deviation        Wrist radial deviation        Wrist pronation        Wrist supination        (Blank rows = not tested)  UPPER EXTREMITY MMT:  MMT Right eval Left eval Right 7/10 Right 8/27  Shoulder flexion    4/5  Shoulder extension      Shoulder abduction      Shoulder adduction      Shoulder internal rotation      Shoulder external rotation   5/5 4/5  Middle trapezius      Lower trapezius      Elbow flexion      Elbow extension  Wrist flexion      Wrist extension      Wrist ulnar deviation      Wrist radial deviation      Wrist pronation      Wrist supination      Grip strength (lbs)      (Blank rows = not tested)  SHOULDER  SPECIAL TESTS: Yergason's Test:  R: negative, L:  negative  Speed's Test:  R:  positive, L: negative                                                                                                                             TREATMENT DATE:   04/14/24: Reviewed pt presentation, pain levels, and response to prior Rx.   UBE lvl 1 x 4 mins  (2 mins each fwd/bwd) Supine serratus punch 2# 3x10 Supine shoulder ABC 1#  S/L ER with 1# 2x10   PT performed STM to bilat UT and medial scap mm, R > L, seated.      PATIENT EDUCATION: Education details:  exercise form, dx, prognosis, rationale of interventions, and POC.  Using the theracane  Person educated: Patient Education method: Explanation, Demonstration, Tactile cues, Verbal cues, Education comprehension: verbalized understanding, returned demonstration, verbal cues required, tactile cues required, and needs further education  HOME EXERCISE PROGRAM: Access Code: [email or web address] URL: [email or web address] Date: 12/23/2023 Prepared by: Mose Minerva  Exercises - Seated Scapular Retraction  - 2 x daily - 7 x weekly - 2 sets - 10 reps - 3 seconds hold   - Seated Shoulder Flexion Towel Slide at Table Top  - 1-2 x daily - 7 x weekly - 1-2 sets - 10 reps - Supine Shoulder Flexion AAROM   - 1 x daily - 7 x weekly - 3 sets - 10 reps - Sidelying Shoulder External Rotation  - 1 x daily - 7 x weekly - 2 sets - 10 reps - Isometric Shoulder Flexion at Wall  - 1 x daily - 5 x weekly - 2 sets - 10 reps - 5 seconds hold - Supine Single Arm Shoulder Protraction  - 1 x daily - 7 x weekly - 2 sets - 10 reps - Supine Shoulder Alphabet  - 1 x daily - 7 x weekly - 1 reps  ASSESSMENT:  CLINICAL IMPRESSION: Pt recently had Covid and wore a mask in PT due to [employer].  She presents to treatment reporting post shoulder and scap pain and also medial scap pain.  She continues to have anterior shoulder pain with reaching across body.  Pt responded  well to [facility] prior treatment and reports sleeping better after STM.  Pt performed exercises well and had fatigue in R UE.  Pt continues to have significant soft tissue tightness in R > L UT and medial scap mm.  Pt purchased a theracane after prior Rx and PT instructed pt to use it.  PT performed STM to bilat UT and medial scap mm and  pt continues to have trigger points.  Pt tolerated STM well.  She responded well to Rx having no c/o's after Rx.  Pt should benefit from cont skilled PT to address ongoing goals and impairments and to restore desired level of function.      OBJECTIVE IMPAIRMENTS: decreased activity tolerance, decreased ROM, decreased strength, hypomobility, increased muscle spasms, impaired flexibility, impaired UE functional use, and pain.   ACTIVITY LIMITATIONS: carrying, lifting, sleeping, bathing, dressing, reach over head, and hygiene/grooming  PARTICIPATION LIMITATIONS: cleaning, driving, shopping, community activity, and occupation  PERSONAL FACTORS: 1 comorbidity: DM type 2 are also affecting patient's functional outcome.   REHAB POTENTIAL: Good  CLINICAL DECISION MAKING: Stable/uncomplicated  EVALUATION COMPLEXITY: Low   GOALS:   SHORT TERM GOALS: Target date: 01/13/2024   Pt will be independent and compliant with HEP for improved ROM, pain, strength, and function.  Baseline: Goal status: GOAL MET  8/27  2.  Pt will report at least a 25% improvement in pain and performance of ADL's.   Baseline: 40% Goal status: MET - 02/03/24  3.  Pt will be able to drive without increased pain.  Baseline: painful with driving in reverse Goal status: Partially MET 02/03/24 Target date:  01/20/2024  4.  Pt will demo at least a 30 deg increase in flexion, abd, and scaption AROM and a 25 deg increase in ER AROM for improved reaching and performance of daily activities.  Baseline:  Goal status:  GOAL MET  03/30/24    LONG TERM GOALS: Target date: 05/05/2024  Pt will be able to  reach overhead into a cabinet without difficulty and significant pain.  Baseline:  Goal status: Partially Met -02/03/24  2.  Pt will be able to perform her work activities without significant pain.  Baseline:  Goal status: NOT MET  3.  Pt will be able to perform self care activities/ADL's without difficulty and significant pain.   Baseline:  Goal status: PROGRESSING  4.  Pt will be able to perform her normal reaching activities without significant pain Baseline:  Goal status: PROGRESSING  5.  Pt will demo R shoulder AROM to be [facility] t/o for performance of ADLs and IADLs.  Baseline:  Goal status: PROGRESSING    PLAN:  PT FREQUENCY: 2x/week  PT DURATION: 6 weeks  PLANNED INTERVENTIONS: 97164- PT Re-evaluation, 97750- Physical Performance Testing, 97110-Therapeutic exercises, 97530- Therapeutic activity, [id number]- Neuromuscular re-education, 97535- Self Care, 02859- Manual therapy, [id number]- Aquatic Therapy, H9716- Electrical stimulation (unattended), [id number]- Electrical stimulation (manual), 97035- Ultrasound, Patient/Family education, Taping, Dry Needling, Joint mobilization, Spinal mobilization, Cryotherapy, and Moist heat  PLAN FOR NEXT SESSION: STM to UT and periscap mm.  Shoulder ROM.  Cont with strengthening per pt tolerance.  Cont with taping as needed.   Leigh Minerva III PT, DPT 04/15/24 4:18 PM     [facility] [address] [place], [place], [address] Phone: [phone]   Fax:  [phone]

## 2024-04-15 ENCOUNTER — Encounter (HOSPITAL_BASED_OUTPATIENT_CLINIC_OR_DEPARTMENT_OTHER): Payer: Self-pay | Admitting: Physical Therapy

## 2024-04-29 ENCOUNTER — Ambulatory Visit (HOSPITAL_BASED_OUTPATIENT_CLINIC_OR_DEPARTMENT_OTHER): Payer: Self-pay | Admitting: Physical Therapy

## 2024-04-29 ENCOUNTER — Encounter (HOSPITAL_BASED_OUTPATIENT_CLINIC_OR_DEPARTMENT_OTHER): Payer: Self-pay | Admitting: Physical Therapy

## 2024-04-29 DIAGNOSIS — M6281 Muscle weakness (generalized): Secondary | ICD-10-CM | POA: Diagnosis not present

## 2024-04-29 DIAGNOSIS — M25511 Pain in right shoulder: Secondary | ICD-10-CM | POA: Diagnosis not present

## 2024-04-29 DIAGNOSIS — M25611 Stiffness of right shoulder, not elsewhere classified: Secondary | ICD-10-CM

## 2024-04-29 NOTE — Therapy (Signed)
 OUTPATIENT PHYSICAL THERAPY SHOULDER TREATMENT     Patient Name: Aimee Hall MRN: [id number] DOB:04/13/1971, 53 y.o., female Today's Date: 04/29/2024  END OF SESSION:  PT End of Session - 04/29/24 0820     Visit Number 13    Number of Visits 21    Date for Recertification  05/05/24    Authorization Type BCBS    PT Start Time 0807    PT Stop Time 0852    PT Time Calculation (min) 45 min    Activity Tolerance Patient tolerated treatment well    Behavior During Therapy WFL for tasks assessed/performed                Past Medical History:  Diagnosis Date   Asthma    Dehydration    Diabetes mellitus without complication (HCC)    Eczema    GERD (gastroesophageal reflux disease)    Syncopal episodes    Urticaria    Past Surgical History:  Procedure Laterality Date   NO PAST SURGERIES     Patient Active Problem List   Diagnosis Date Noted   Chronic migraine without aura, with intractable migraine, so stated, with status migrainosus 02/16/2023    PCP: Alvera Reagin, PA  REFERRING PROVIDER: Dasie Fitch, MD  REFERRING DIAG: M75.21 (ICD-10-CM) - Bicipital tendinitis, right shoulder  THERAPY DIAG:  Right shoulder pain, unspecified chronicity  Stiffness of right shoulder, not elsewhere classified  Muscle weakness (generalized)  Rationale for Evaluation and Treatment: Rehabilitation  ONSET DATE: Nov 2024  SUBJECTIVE:                                                                                                                                                                                      SUBJECTIVE STATEMENT: Pt states she felt much better and slept better that night after soft tissue work after prior Rx.  Pt states she had a good release last treatment.  Pt is using the theracane at home.  Pt reports 2-3/10 pain at rest and increases with certain movements.  Pt c/o's of a pinching pain in anterior shoulder.  She states it's in the crease.   She  continues to have the pinching pain in anterior shoulder when reaching across her body.      Hand dominance: Right  PERTINENT HISTORY: Bilat hip pain DM type 2, obesity, migraine/HA's, anxiety  PAIN:  NPRS:  2-3/10 current at rest, 6-7/10 pain with pushing the dispenser Location:  anterior shoulder Type:  dull at rest, sharp with movement.  constant  PRECAUTIONS: None    WEIGHT BEARING RESTRICTIONS: No  FALLS:  Has patient fallen in last 6 months? 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  DIAGNOSTIC FINDINGS:  Pt had x rays though PT unable to view them.  MRI: FINDINGS:   Bones: Moderate AC joint arthrosis is present with reactive edema and mild hypertrophy. Mild glenohumeral arthrosis is present. No significant joint effusion. There is an indeterminate bubbly cystic lesion in the posterior lateral humeral head measuring 1.5 cm in maximum size. This could be a benign cyst or possibly chondroid lesion. No aggressive features are present. Correlation with x-ray recommended to evaluate for possible calcific matrix. Otherwise, the bones are unremarkable. There is no fracture or contusion pattern.   Rotator cuff: There is mild insertional tendinosis of the supraspinatus and infraspinatus tendons. There is no significant partial or full-thickness tear. There is a mild intrasubstance partial tear of the supraspinatus tendon. No full-thickness tear is present. No significant fatty atrophy of the rotator cuff muscles. The subscapularis and teres minor tendons are unremarkable.   Labrum and biceps tendon: Biceps tendon is intact. There is mild blunting of the  superior labrum without a well defined superior labral tear. The anterior and posterior labrum are unremarkable.     IMPRESSION: Moderate AC joint arthrosis with reactive edema and mild hypertrophy. Correlation for [facility] joint symptoms.   Mild insertional tendinosis of the supraspinatus tendon with a mild interstitial partial tear. No significant partial or full-thickness rotator cuff tear is present.   Biceps tendon is intact.   Slight blunting of the superior labrum without evidence of a discrete superior labral tear.  PATIENT SURVEYS:  UEFI:  36/80  COGNITION: Overall cognitive status: Within functional limits for tasks assessed      UPPER EXTREMITY ROM:   AROM/PROM Right eval Left eval Right 6/9 Right 7/2 Right 7/10 Right 8/27  Shoulder flexion 62/83 with pain 154 129  148 150  Shoulder scaption 83 with pain 150   133 145  Shoulder abduction 69 with pain 112 with pain  109 92 105  Shoulder adduction        Shoulder internal rotation        Shoulder external rotation 13/18 51 47  38/50 52  Elbow flexion        Elbow extension        Wrist flexion        Wrist extension        Wrist ulnar deviation        Wrist radial deviation        Wrist pronation        Wrist supination        (Blank rows = not tested)  UPPER EXTREMITY MMT:  MMT Right eval Left eval Right 7/10 Right 8/27  Shoulder flexion    4/5  Shoulder extension      Shoulder abduction      Shoulder adduction      Shoulder internal rotation      Shoulder external rotation   5/5 4/5  Middle trapezius      Lower trapezius      Elbow flexion      Elbow extension      Wrist flexion      Wrist extension  Wrist ulnar deviation      Wrist radial deviation      Wrist pronation      Wrist supination      Grip strength (lbs)      (Blank rows = not tested)  SHOULDER SPECIAL TESTS: Yergason's Test:  R: negative, L:  negative  Speed's Test:  R:  positive, L: negative                                                                                                                              TREATMENT DATE:   04/29/24: Reviewed pt presentation, pain levels, and response to prior Rx.   UBE lvl 1 x 4 mins  (2 mins each fwd/bwd) Supine serratus punch 2# 3x10 Supine shoulder ABC 1#  S/L ER with 1# 2x10 Supine rhythmic stab's at 90 deg flexion 3x30 sec Standing rows with GTB with retraction  Seated UT stretch 3x20 sec on R, 2x20 sec on L  PT updated HEP and gave pt a HEP handout.  PT educated pt in correct form and appropriate frequency.   PT performed STM and TPR to R UT seated.      PATIENT EDUCATION: Education details:  exercise form, dx, prognosis, rationale of interventions, and POC.  Using the theracane  Person educated: Patient Education method: Explanation, Demonstration, Tactile cues, Verbal cues, Education comprehension: verbalized understanding, returned demonstration, verbal cues required, tactile cues required, and needs further education  HOME EXERCISE PROGRAM: Access Code: [email or web address] URL: [email or web address] Date: 12/23/2023 Prepared by: Mose Minerva  Exercises - Seated Scapular Retraction  - 2 x daily - 7 x weekly - 2 sets - 10 reps - 3 seconds hold   - Seated Shoulder Flexion Towel Slide at Table Top  - 1-2 x daily - 7 x weekly - 1-2 sets - 10 reps - Supine Shoulder Flexion AAROM   - 1 x daily - 7 x weekly - 3 sets - 10 reps - Sidelying Shoulder External Rotation  - 1 x daily - 7 x weekly - 2 sets - 10 reps - Isometric Shoulder Flexion at Wall  - 1 x daily - 5 x weekly - 2 sets - 10 reps - 5 seconds hold - Supine Single Arm Shoulder Protraction  - 1 x daily - 7 x weekly - 2 sets - 10 reps - Supine Shoulder Alphabet  - 1 x daily - 7 x weekly - 1 reps  Updated HEP: - Standing Shoulder Row with Anchored Resistance  - 1 x daily - 4-5 x weekly - 2-3 sets - 10 reps  ASSESSMENT:  CLINICAL IMPRESSION: Pt declined evaluating hip and wanted  to focus on shoulder.  Pt continues to have a pinching pain in anterior shoulder.  She continues to have significant soft tissue tightness in UT and is using a theracane at home.  Pt performed exercises to improve shoulder, RTC, and scapular strength and stability.  She performed exercises well with cuing  and instruction in correct form.  Pt reports improved stiffness after exercises.  Pt continues to have significant soft tissue tightness in UT and responds well to STM/TPR.  PT also had pt perform UT stretch to improve tightness.  Pt responded well to treatment stating she felt great after treatment having improved pain to 1/10.  Pt states she feels like the muscle has released. Pt should benefit from cont skilled PT to address ongoing goals and impairments and to restore desired level of function.       OBJECTIVE IMPAIRMENTS: decreased activity tolerance, decreased ROM, decreased strength, hypomobility, increased muscle spasms, impaired flexibility, impaired UE functional use, and pain.   ACTIVITY LIMITATIONS: carrying, lifting, sleeping, bathing, dressing, reach over head, and hygiene/grooming  PARTICIPATION LIMITATIONS: cleaning, driving, shopping, community activity, and occupation  PERSONAL FACTORS: 1 comorbidity: DM type 2 are also affecting patient's functional outcome.   REHAB POTENTIAL: Good  CLINICAL DECISION MAKING: Stable/uncomplicated  EVALUATION COMPLEXITY: Low   GOALS:   SHORT TERM GOALS: Target date: 01/13/2024   Pt will be independent and compliant with HEP for improved ROM, pain, strength, and function.  Baseline: Goal status: GOAL MET  8/27  2.  Pt will report at least a 25% improvement in pain and performance of ADL's.   Baseline: 40% Goal status: MET - 02/03/24  3.  Pt will be able to drive without increased pain.  Baseline: painful with driving in reverse Goal status: Partially MET 02/03/24 Target date:  01/20/2024  4.  Pt will demo at least a 30 deg increase in  flexion, abd, and scaption AROM and a 25 deg increase in ER AROM for improved reaching and performance of daily activities.  Baseline:  Goal status:  GOAL MET  03/30/24    LONG TERM GOALS: Target date: 05/05/2024  Pt will be able to reach overhead into a cabinet without difficulty and significant pain.  Baseline:  Goal status: Partially Met -02/03/24  2.  Pt will be able to perform her work activities without significant pain.  Baseline:  Goal status: NOT MET  3.  Pt will be able to perform self care activities/ADL's without difficulty and significant pain.   Baseline:  Goal status: PROGRESSING  4.  Pt will be able to perform her normal reaching activities without significant pain Baseline:  Goal status: PROGRESSING  5.  Pt will demo R shoulder AROM to be [facility] t/o for performance of ADLs and IADLs.  Baseline:  Goal status: PROGRESSING    PLAN:  PT FREQUENCY: 2x/week  PT DURATION: 6 weeks  PLANNED INTERVENTIONS: 97164- PT Re-evaluation, 97750- Physical Performance Testing, 97110-Therapeutic exercises, 97530- Therapeutic activity, [id number]- Neuromuscular re-education, 97535- Self Care, 02859- Manual therapy, [id number]- Aquatic Therapy, H9716- Electrical stimulation (unattended), [id number]- Electrical stimulation (manual), 97035- Ultrasound, Patient/Family education, Taping, Dry Needling, Joint mobilization, Spinal mobilization, Cryotherapy, and Moist heat  PLAN FOR NEXT SESSION: STM to UT and periscap mm.  Shoulder ROM.  Cont with strengthening per pt tolerance.  Cont with taping as needed.   Leigh Minerva III PT, DPT 04/29/24 11:52 AM      [facility] [address] [place], [place], [address] Phone: [phone]   Fax:  [phone]

## 2024-05-02 ENCOUNTER — Other Ambulatory Visit (HOSPITAL_BASED_OUTPATIENT_CLINIC_OR_DEPARTMENT_OTHER): Payer: Self-pay

## 2024-05-02 DIAGNOSIS — E1169 Type 2 diabetes mellitus with other specified complication: Secondary | ICD-10-CM | POA: Diagnosis not present

## 2024-05-02 DIAGNOSIS — E559 Vitamin D deficiency, unspecified: Secondary | ICD-10-CM | POA: Diagnosis not present

## 2024-05-02 DIAGNOSIS — Z6837 Body mass index (BMI) 37.0-37.9, adult: Secondary | ICD-10-CM | POA: Diagnosis not present

## 2024-05-02 DIAGNOSIS — K5909 Other constipation: Secondary | ICD-10-CM | POA: Diagnosis not present

## 2024-05-02 DIAGNOSIS — Z79899 Other long term (current) drug therapy: Secondary | ICD-10-CM | POA: Diagnosis not present

## 2024-05-02 DIAGNOSIS — E7800 Pure hypercholesterolemia, unspecified: Secondary | ICD-10-CM | POA: Diagnosis not present

## 2024-05-02 MED ORDER — MOUNJARO 7.5 MG/0.5ML ~~LOC~~ SOAJ
7.5000 mg | SUBCUTANEOUS | 0 refills | Status: DC
  Filled 2024-05-02: qty 2, 28d supply, fill #0
  Filled 2024-05-18 – 2024-05-30 (×4): qty 2, 28d supply, fill #1

## 2024-05-04 ENCOUNTER — Ambulatory Visit (HOSPITAL_BASED_OUTPATIENT_CLINIC_OR_DEPARTMENT_OTHER): Payer: Self-pay | Attending: Sports Medicine | Admitting: Physical Therapy

## 2024-05-04 DIAGNOSIS — M25611 Stiffness of right shoulder, not elsewhere classified: Secondary | ICD-10-CM | POA: Diagnosis not present

## 2024-05-04 DIAGNOSIS — M6281 Muscle weakness (generalized): Secondary | ICD-10-CM | POA: Diagnosis not present

## 2024-05-04 DIAGNOSIS — M25511 Pain in right shoulder: Secondary | ICD-10-CM | POA: Insufficient documentation

## 2024-05-04 NOTE — Therapy (Unsigned)
 OUTPATIENT PHYSICAL THERAPY SHOULDER TREATMENT     Patient Name: Aimee Hall MRN: [id number] DOB:11-25-1970, 53 y.o., female Today's Date: 05/05/2024  END OF SESSION:  PT End of Session - 05/04/24 1542     Visit Number 14    Number of Visits 21    Date for Recertification  05/05/24    Authorization Type BCBS    PT Start Time 1540    PT Stop Time 1624    PT Time Calculation (min) 44 min    Activity Tolerance Patient tolerated treatment well    Behavior During Therapy WFL for tasks assessed/performed                Past Medical History:  Diagnosis Date   Asthma    Dehydration    Diabetes mellitus without complication (HCC)    Eczema    GERD (gastroesophageal reflux disease)    Syncopal episodes    Urticaria    Past Surgical History:  Procedure Laterality Date   NO PAST SURGERIES     Patient Active Problem List   Diagnosis Date Noted   Chronic migraine without aura, with intractable migraine, so stated, with status migrainosus 02/16/2023    PCP: Aimee Reagin, PA  REFERRING PROVIDER: Dasie Fitch, MD  REFERRING DIAG: M75.21 (ICD-10-CM) - Bicipital tendinitis, right shoulder  THERAPY DIAG:  Right shoulder pain, unspecified chronicity  Stiffness of right shoulder, not elsewhere classified  Muscle weakness (generalized)  Rationale for Evaluation and Treatment: Rehabilitation  ONSET DATE: Nov 2024  SUBJECTIVE:                                                                                                                                                                                      SUBJECTIVE STATEMENT: Pt saw her massage therapist on Saturday.  Pt states she worked on her pec minor and felt much better afterwards.  Pt was very tender with pec minor massage.  Pt states she felt good after prior Rx and slept better that night.  Pt is using the theracane at home.  Pt continues to have the pinching pain though is less since the massage.       Hand dominance: Right  PERTINENT HISTORY: Bilat hip pain DM type 2, obesity, migraine/HA's, anxiety  PAIN:  NPRS:  0/10 current at rest, 6-7/10 pain with pushing the dispenser Location:  anterior shoulder Type:  dull at rest, sharp with movement.  constant  PRECAUTIONS: None    WEIGHT BEARING RESTRICTIONS: No  FALLS:  Has patient fallen in last 6 months? 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  DIAGNOSTIC FINDINGS:  Pt had x rays though PT unable to view them.  MRI: FINDINGS:   Bones: Moderate AC joint arthrosis is present with reactive edema and mild hypertrophy. Mild glenohumeral arthrosis is present. No significant joint effusion. There is an indeterminate bubbly cystic lesion in the posterior lateral humeral head measuring 1.5 cm in maximum size. This could be a benign cyst or possibly chondroid lesion. No aggressive features are present. Correlation with x-ray recommended to evaluate for possible calcific matrix. Otherwise, the bones are unremarkable. There is no fracture or contusion pattern.   Rotator cuff: There is mild insertional tendinosis of the supraspinatus and infraspinatus tendons. There is no significant partial or full-thickness tear. There is a mild intrasubstance partial tear of the supraspinatus tendon. No full-thickness tear is present. No significant fatty atrophy of the rotator cuff muscles. The subscapularis and teres minor tendons are unremarkable.   Labrum and biceps tendon: Biceps tendon is intact. There is mild blunting of the superior labrum without a well defined superior labral tear. The anterior and posterior labrum  are unremarkable.     IMPRESSION: Moderate AC joint arthrosis with reactive edema and mild hypertrophy. Correlation for [facility] joint symptoms.   Mild insertional tendinosis of the supraspinatus tendon with a mild interstitial partial tear. No significant partial or full-thickness rotator cuff tear is present.   Biceps tendon is intact.   Slight blunting of the superior labrum without evidence of a discrete superior labral tear.  PATIENT SURVEYS:  UEFI:  36/80  COGNITION: Overall cognitive status: Within functional limits for tasks assessed      UPPER EXTREMITY ROM:   AROM/PROM Right eval Left eval Right 6/9 Right 7/2 Right 7/10 Right 8/27  Shoulder flexion 62/83 with pain 154 129  148 150  Shoulder scaption 83 with pain 150   133 145  Shoulder abduction 69 with pain 112 with pain  109 92 105  Shoulder adduction        Shoulder internal rotation        Shoulder external rotation 13/18 51 47  38/50 52  Elbow flexion        Elbow extension        Wrist flexion        Wrist extension        Wrist ulnar deviation        Wrist radial deviation        Wrist pronation        Wrist supination        (Blank rows = not tested)  UPPER EXTREMITY MMT:  MMT Right eval Left eval Right 7/10 Right 8/27  Shoulder flexion    4/5  Shoulder extension      Shoulder abduction      Shoulder adduction      Shoulder internal rotation      Shoulder external rotation   5/5 4/5  Middle trapezius      Lower trapezius      Elbow flexion      Elbow extension      Wrist flexion      Wrist extension      Wrist ulnar deviation      Wrist radial deviation  Wrist pronation      Wrist supination      Grip strength (lbs)      (Blank rows = not tested)  SHOULDER SPECIAL TESTS: Yergason's Test:  R: negative, L:  negative  Speed's Test:  R:  positive, L: negative                                                                                                                              TREATMENT DATE:   05/04/24: Reviewed pt presentation, pain levels, and response to prior Rx.   UBE lvl 1 x 4 mins  (2 mins each fwd/bwd) Supine shoulder ABC 2# x 1 rep S/L ER with 1# 2x10 Supine rhythmic stab's at 90 deg flexion 3x30 sec Standing rows with GTB with retraction 3x10 Standing shoulder extension with RTB 2x10 Doorway pec stretch 3x15 sec Seated UT stretch 2x20 sec bilat  PT performed STM and TPR to R UT and medial scap mm seated.      PATIENT EDUCATION: Education details:  exercise form, dx, prognosis, rationale of interventions, and POC.  Using the theracane  Person educated: Patient Education method: Explanation, Demonstration, Tactile cues, Verbal cues, Education comprehension: verbalized understanding, returned demonstration, verbal cues required, tactile cues required, and needs further education  HOME EXERCISE PROGRAM: Access Code: [email or web address] URL: [email or web address] Date: 12/23/2023 Prepared by: Mose Minerva  Exercises - Seated Scapular Retraction  - 2 x daily - 7 x weekly - 2 sets - 10 reps - 3 seconds hold   - Seated Shoulder Flexion Towel Slide at Table Top  - 1-2 x daily - 7 x weekly - 1-2 sets - 10 reps - Supine Shoulder Flexion AAROM   - 1 x daily - 7 x weekly - 3 sets - 10 reps - Sidelying Shoulder External Rotation  - 1 x daily - 7 x weekly - 2 sets - 10 reps - Isometric Shoulder Flexion at Wall  - 1 x daily - 5 x weekly - 2 sets - 10 reps - 5 seconds hold - Supine Single Arm Shoulder Protraction  - 1 x daily - 7 x weekly - 2 sets - 10 reps - Supine Shoulder Alphabet  - 1 x daily - 7 x weekly - 1 reps - Standing Shoulder Row with Anchored Resistance  - 1 x daily - 4-5 x weekly - 2-3 sets - 10 reps  ASSESSMENT:  CLINICAL IMPRESSION: Pt had a massage over the weekend and reports improved pinching pain since.  Pt continues to report decreased pain and improved sleep after receiving STM/TPR to UT.  Pt performed neuro re-ed  activities to improve kinesthetic awareness and postural stabilization with functional usage of R UE including reaching activities.  She performed therapeutic exercises and neuro re-ed activities well.  Pt has improved soft tissue tightness in UT though continues to have tightness and trigger points present.  Pt should benefit from cont skilled PT to address ongoing goals and impairments  and to restore desired level of function.      OBJECTIVE IMPAIRMENTS: decreased activity tolerance, decreased ROM, decreased strength, hypomobility, increased muscle spasms, impaired flexibility, impaired UE functional use, and pain.   ACTIVITY LIMITATIONS: carrying, lifting, sleeping, bathing, dressing, reach over head, and hygiene/grooming  PARTICIPATION LIMITATIONS: cleaning, driving, shopping, community activity, and occupation  PERSONAL FACTORS: 1 comorbidity: DM type 2 are also affecting patient's functional outcome.   REHAB POTENTIAL: Good  CLINICAL DECISION MAKING: Stable/uncomplicated  EVALUATION COMPLEXITY: Low   GOALS:   SHORT TERM GOALS: Target date: 01/13/2024   Pt will be independent and compliant with HEP for improved ROM, pain, strength, and function.  Baseline: Goal status: GOAL MET  8/27  2.  Pt will report at least a 25% improvement in pain and performance of ADL's.   Baseline: 40% Goal status: MET - 02/03/24  3.  Pt will be able to drive without increased pain.  Baseline: painful with driving in reverse Goal status: Partially MET 02/03/24 Target date:  01/20/2024  4.  Pt will demo at least a 30 deg increase in flexion, abd, and scaption AROM and a 25 deg increase in ER AROM for improved reaching and performance of daily activities.  Baseline:  Goal status:  GOAL MET  03/30/24    LONG TERM GOALS: Target date: 05/05/2024  Pt will be able to reach overhead into a cabinet without difficulty and significant pain.  Baseline:  Goal status: Partially Met -02/03/24  2.  Pt will be  able to perform her work activities without significant pain.  Baseline:  Goal status: NOT MET  3.  Pt will be able to perform self care activities/ADL's without difficulty and significant pain.   Baseline:  Goal status: PROGRESSING  4.  Pt will be able to perform her normal reaching activities without significant pain Baseline:  Goal status: PROGRESSING  5.  Pt will demo R shoulder AROM to be [facility] t/o for performance of ADLs and IADLs.  Baseline:  Goal status: PROGRESSING    PLAN:  PT FREQUENCY: 2x/week  PT DURATION: 6 weeks  PLANNED INTERVENTIONS: 97164- PT Re-evaluation, 97750- Physical Performance Testing, 97110-Therapeutic exercises, 97530- Therapeutic activity, [id number]- Neuromuscular re-education, 97535- Self Care, [id number]- Manual therapy, [id number]- Aquatic Therapy, H9716- Electrical stimulation (unattended), [id number]- Electrical stimulation (manual), 97035- Ultrasound, Patient/Family education, Taping, Dry Needling, Joint mobilization, Spinal mobilization, Cryotherapy, and Moist heat  PLAN FOR NEXT SESSION: STM to UT and periscap mm.  Shoulder ROM.  Cont with strengthening per pt tolerance.  Cont with taping as needed.   Leigh Minerva III PT, DPT 05/05/24 11:08 PM     [facility] [address] [place], [place], [address] Phone: [phone]   Fax:  [phone]

## 2024-05-05 ENCOUNTER — Encounter (HOSPITAL_BASED_OUTPATIENT_CLINIC_OR_DEPARTMENT_OTHER): Payer: Self-pay | Admitting: Physical Therapy

## 2024-05-11 ENCOUNTER — Telehealth: Payer: Self-pay | Admitting: *Deleted

## 2024-05-11 DIAGNOSIS — R0902 Hypoxemia: Secondary | ICD-10-CM

## 2024-05-11 DIAGNOSIS — G4733 Obstructive sleep apnea (adult) (pediatric): Secondary | ICD-10-CM

## 2024-05-11 NOTE — Telephone Encounter (Signed)
 SABRA

## 2024-05-12 ENCOUNTER — Ambulatory Visit (HOSPITAL_BASED_OUTPATIENT_CLINIC_OR_DEPARTMENT_OTHER): Payer: Self-pay | Admitting: Physical Therapy

## 2024-05-12 DIAGNOSIS — M6281 Muscle weakness (generalized): Secondary | ICD-10-CM | POA: Diagnosis not present

## 2024-05-12 DIAGNOSIS — M25611 Stiffness of right shoulder, not elsewhere classified: Secondary | ICD-10-CM

## 2024-05-12 DIAGNOSIS — M25511 Pain in right shoulder: Secondary | ICD-10-CM

## 2024-05-12 NOTE — Therapy (Signed)
 OUTPATIENT PHYSICAL THERAPY SHOULDER TREATMENT     Patient Name: Aimee Hall MRN: [id number] DOB:1971/07/15, 53 y.o., female Today's Date: 05/12/2024  END OF SESSION:  PT End of Session - 05/12/24 1628     Visit Number 15    PT Start Time 1626                Past Medical History:  Diagnosis Date   Asthma    Dehydration    Diabetes mellitus without complication (HCC)    Eczema    GERD (gastroesophageal reflux disease)    Syncopal episodes    Urticaria    Past Surgical History:  Procedure Laterality Date   NO PAST SURGERIES     Patient Active Problem List   Diagnosis Date Noted   Chronic migraine without aura, with intractable migraine, so stated, with status migrainosus 02/16/2023    PCP: Alvera Reagin, PA  REFERRING PROVIDER: Dasie Fitch, MD  REFERRING DIAG: M75.21 (ICD-10-CM) - Bicipital tendinitis, right shoulder  THERAPY DIAG:  No diagnosis found.  Rationale for Evaluation and Treatment: Rehabilitation  ONSET DATE: Nov 2024  SUBJECTIVE:                                                                                                                                                                                      SUBJECTIVE STATEMENT: Pt states she was feeling good until today.    aw her massage therapist on Saturday.  Pt states she worked on her pec minor and felt much better afterwards.  Pt was very tender with pec minor massage.  Pt states she felt good after prior Rx and slept better that night.  Pt is using the theracane at home.  Pt continues to have the pinching pain though is less since the massage.      Hand dominance: Right  PERTINENT HISTORY: Bilat hip pain DM type 2, obesity, migraine/HA's, anxiety  PAIN:  NPRS:  0/10 current at rest, 6-7/10 pain with pushing the dispenser Location:  anterior shoulder Type:  dull at rest, sharp with movement.  constant  PRECAUTIONS: None    WEIGHT BEARING RESTRICTIONS: No  FALLS:   Has patient fallen in last 6 months? 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  DIAGNOSTIC FINDINGS:  Pt had x rays though PT unable to view them.  MRI: FINDINGS:   Bones: Moderate AC joint arthrosis is present with reactive edema and mild hypertrophy. Mild glenohumeral arthrosis is present. No significant joint effusion. There is an indeterminate bubbly cystic lesion in the posterior lateral humeral head measuring 1.5 cm in maximum size. This could be a benign cyst or possibly chondroid lesion. No aggressive features are present. Correlation with x-ray recommended to evaluate for possible calcific matrix. Otherwise, the bones are unremarkable. There is no fracture or contusion pattern.   Rotator cuff: There is mild insertional tendinosis of the supraspinatus and infraspinatus tendons. There is no significant partial or full-thickness tear. There is a mild intrasubstance partial tear of the supraspinatus tendon. No full-thickness tear is present. No significant fatty atrophy of the rotator cuff muscles. The subscapularis and teres minor tendons are unremarkable.   Labrum and biceps tendon: Biceps tendon is intact. There is mild blunting of the superior labrum without a well defined superior labral tear. The anterior and posterior labrum are unremarkable.     IMPRESSION: Moderate AC joint arthrosis with reactive edema and mild hypertrophy. Correlation for [facility] joint symptoms.   Mild insertional tendinosis of the supraspinatus tendon with a mild interstitial partial tear. No significant partial or full-thickness rotator cuff tear is present.   Biceps tendon is  intact.   Slight blunting of the superior labrum without evidence of a discrete superior labral tear.  PATIENT SURVEYS:  UEFI:  36/80  COGNITION: Overall cognitive status: Within functional limits for tasks assessed      UPPER EXTREMITY ROM:   AROM/PROM Right eval Left eval Right 6/9 Right 7/2 Right 7/10 Right 8/27  Shoulder flexion 62/83 with pain 154 129  148 150  Shoulder scaption 83 with pain 150   133 145  Shoulder abduction 69 with pain 112 with pain  109 92 105  Shoulder adduction        Shoulder internal rotation        Shoulder external rotation 13/18 51 47  38/50 52  Elbow flexion        Elbow extension        Wrist flexion        Wrist extension        Wrist ulnar deviation        Wrist radial deviation        Wrist pronation        Wrist supination        (Blank rows = not tested)  UPPER EXTREMITY MMT:  MMT Right eval Left eval Right 7/10 Right 8/27  Shoulder flexion    4/5  Shoulder extension      Shoulder abduction      Shoulder adduction      Shoulder internal rotation      Shoulder external rotation   5/5 4/5  Middle trapezius      Lower trapezius      Elbow flexion      Elbow extension      Wrist flexion      Wrist extension      Wrist ulnar deviation      Wrist radial deviation      Wrist pronation      Wrist supination      Grip strength (lbs)      (Blank rows = not tested)  SHOULDER SPECIAL TESTS: Yergason's Test:  R: negative, L:  negative  Speed's Test:  R:  positive, L: negative  TREATMENT DATE:   05/12/24: Reviewed pt presentation, pain levels, and response to prior Rx.   UBE lvl 1 x 4 mins  (2 mins each fwd/bwd) Standing rows with GTB with retraction 3x10 Standing shoulder extension with GTB 2x10 Standing shoulder IR with RTB  3x10 Supine shoulder ABC 2# x 1 rep S/L ER with 1# 2x10 Supine  rhythmic stab's at 90 and 60 deg flexion 2x30 sec each S/L ER 1# x10, 2#2x10 Seated UT stretch 2x20 sec bilat  PT performed STM and TPR to R UT and medial scap mm seated.      PATIENT EDUCATION: Education details:  exercise form, dx, prognosis, rationale of interventions, and POC.  Using the theracane  Person educated: Patient Education method: Explanation, Demonstration, Tactile cues, Verbal cues, Education comprehension: verbalized understanding, returned demonstration, verbal cues required, tactile cues required, and needs further education  HOME EXERCISE PROGRAM: Access Code: [email or web address] URL: [email or web address] Date: 12/23/2023 Prepared by: Mose Minerva  Exercises - Seated Scapular Retraction  - 2 x daily - 7 x weekly - 2 sets - 10 reps - 3 seconds hold   - Seated Shoulder Flexion Towel Slide at Table Top  - 1-2 x daily - 7 x weekly - 1-2 sets - 10 reps - Supine Shoulder Flexion AAROM   - 1 x daily - 7 x weekly - 3 sets - 10 reps - Sidelying Shoulder External Rotation  - 1 x daily - 7 x weekly - 2 sets - 10 reps - Isometric Shoulder Flexion at Wall  - 1 x daily - 5 x weekly - 2 sets - 10 reps - 5 seconds hold - Supine Single Arm Shoulder Protraction  - 1 x daily - 7 x weekly - 2 sets - 10 reps - Supine Shoulder Alphabet  - 1 x daily - 7 x weekly - 1 reps - Standing Shoulder Row with Anchored Resistance  - 1 x daily - 4-5 x weekly - 2-3 sets - 10 reps  ASSESSMENT:  CLINICAL IMPRESSION: Pt states her shoulder has been doing better until today.  had a massage over the weekend and reports improved pinching pain since.  Pt continues to report decreased pain and improved sleep after receiving STM/TPR to UT.  Pt performed neuro re-ed activities to improve kinesthetic awareness and postural stabilization with functional usage of R UE including reaching activities.  She performed therapeutic exercises and neuro re-ed activities well.  Pt has improved soft tissue  tightness in UT though continues to have tightness and trigger points present.  Pt should benefit from cont skilled PT to address ongoing goals and impairments and to restore desired level of function.      OBJECTIVE IMPAIRMENTS: decreased activity tolerance, decreased ROM, decreased strength, hypomobility, increased muscle spasms, impaired flexibility, impaired UE functional use, and pain.   ACTIVITY LIMITATIONS: carrying, lifting, sleeping, bathing, dressing, reach over head, and hygiene/grooming  PARTICIPATION LIMITATIONS: cleaning, driving, shopping, community activity, and occupation  PERSONAL FACTORS: 1 comorbidity: DM type 2 are also affecting patient's functional outcome.   REHAB POTENTIAL: Good  CLINICAL DECISION MAKING: Stable/uncomplicated  EVALUATION COMPLEXITY: Low   GOALS:   SHORT TERM GOALS: Target date: 01/13/2024   Pt will be independent and compliant with HEP for improved ROM, pain, strength, and function.  Baseline: Goal status: GOAL MET  8/27  2.  Pt will report at least a 25% improvement in pain and performance of ADL's.   Baseline: 40% Goal status: MET - 02/03/24  3.  Pt will  be able to drive without increased pain.  Baseline: painful with driving in reverse Goal status: Partially MET 02/03/24 Target date:  01/20/2024  4.  Pt will demo at least a 30 deg increase in flexion, abd, and scaption AROM and a 25 deg increase in ER AROM for improved reaching and performance of daily activities.  Baseline:  Goal status:  GOAL MET  03/30/24    LONG TERM GOALS: Target date: 05/05/2024  Pt will be able to reach overhead into a cabinet without difficulty and significant pain.  Baseline:  Goal status: Partially Met -02/03/24  2.  Pt will be able to perform her work activities without significant pain.  Baseline:  Goal status: NOT MET  3.  Pt will be able to perform self care activities/ADL's without difficulty and significant pain.   Baseline:  Goal status:  PROGRESSING  4.  Pt will be able to perform her normal reaching activities without significant pain Baseline:  Goal status: PROGRESSING  5.  Pt will demo R shoulder AROM to be [facility] t/o for performance of ADLs and IADLs.  Baseline:  Goal status: PROGRESSING    PLAN:  PT FREQUENCY: 2x/week  PT DURATION: 6 weeks  PLANNED INTERVENTIONS: 97164- PT Re-evaluation, 97750- Physical Performance Testing, 97110-Therapeutic exercises, 97530- Therapeutic activity, [id number]- Neuromuscular re-education, 97535- Self Care, 02859- Manual therapy, [id number]- Aquatic Therapy, H9716- Electrical stimulation (unattended), [id number]- Electrical stimulation (manual), 97035- Ultrasound, Patient/Family education, Taping, Dry Needling, Joint mobilization, Spinal mobilization, Cryotherapy, and Moist heat  PLAN FOR NEXT SESSION: STM to UT and periscap mm.  Shoulder ROM.  Cont with strengthening per pt tolerance.  Cont with taping as needed.   Leigh Minerva III PT, DPT 05/12/24 4:29 PM     [facility] [address] [place], [place], [address] Phone: [phone]   Fax:  [phone]

## 2024-05-13 ENCOUNTER — Encounter (HOSPITAL_BASED_OUTPATIENT_CLINIC_OR_DEPARTMENT_OTHER): Payer: Self-pay | Admitting: Physical Therapy

## 2024-05-16 ENCOUNTER — Other Ambulatory Visit (HOSPITAL_BASED_OUTPATIENT_CLINIC_OR_DEPARTMENT_OTHER): Payer: Self-pay

## 2024-05-18 ENCOUNTER — Ambulatory Visit (HOSPITAL_BASED_OUTPATIENT_CLINIC_OR_DEPARTMENT_OTHER): Admitting: Physical Therapy

## 2024-05-18 ENCOUNTER — Other Ambulatory Visit (HOSPITAL_BASED_OUTPATIENT_CLINIC_OR_DEPARTMENT_OTHER): Payer: Self-pay

## 2024-05-23 ENCOUNTER — Other Ambulatory Visit (HOSPITAL_BASED_OUTPATIENT_CLINIC_OR_DEPARTMENT_OTHER): Payer: Self-pay

## 2024-05-24 ENCOUNTER — Other Ambulatory Visit (HOSPITAL_BASED_OUTPATIENT_CLINIC_OR_DEPARTMENT_OTHER): Payer: Self-pay

## 2024-05-24 ENCOUNTER — Telehealth: Payer: Self-pay | Admitting: Family Medicine

## 2024-05-24 MED ORDER — LOSARTAN POTASSIUM 25 MG PO TABS
25.0000 mg | ORAL_TABLET | Freq: Every day | ORAL | 1 refills | Status: AC
  Filled 2024-05-24: qty 90, 90d supply, fill #0
  Filled 2024-08-22: qty 90, 90d supply, fill #1

## 2024-05-24 NOTE — Telephone Encounter (Signed)
 CPAP BCBS fed pending

## 2024-05-25 ENCOUNTER — Ambulatory Visit (HOSPITAL_BASED_OUTPATIENT_CLINIC_OR_DEPARTMENT_OTHER): Admitting: Physical Therapy

## 2024-05-25 ENCOUNTER — Other Ambulatory Visit (HOSPITAL_BASED_OUTPATIENT_CLINIC_OR_DEPARTMENT_OTHER): Payer: Self-pay

## 2024-05-25 DIAGNOSIS — M25611 Stiffness of right shoulder, not elsewhere classified: Secondary | ICD-10-CM | POA: Diagnosis not present

## 2024-05-25 DIAGNOSIS — M25511 Pain in right shoulder: Secondary | ICD-10-CM | POA: Diagnosis not present

## 2024-05-25 DIAGNOSIS — M6281 Muscle weakness (generalized): Secondary | ICD-10-CM

## 2024-05-25 NOTE — Therapy (Signed)
 OUTPATIENT PHYSICAL THERAPY SHOULDER TREATMENT     Patient Name: Aimee Hall MRN: [id number] DOB:1971-06-03, 53 y.o., female Today's Date: 05/26/2024  END OF SESSION:  PT End of Session - 05/25/24 1634     Visit Number 16    Number of Visits 23    Date for Recertification  06/23/24    Authorization Type BCBS    PT Start Time 1540    PT Stop Time 1625    PT Time Calculation (min) 45 min    Activity Tolerance Patient tolerated treatment well    Behavior During Therapy WFL for tasks assessed/performed                 Past Medical History:  Diagnosis Date   Asthma    Dehydration    Diabetes mellitus without complication (HCC)    Eczema    GERD (gastroesophageal reflux disease)    Syncopal episodes    Urticaria    Past Surgical History:  Procedure Laterality Date   NO PAST SURGERIES     Patient Active Problem List   Diagnosis Date Noted   Chronic migraine without aura, with intractable migraine, so stated, with status migrainosus 02/16/2023    PCP: Alvera Reagin, PA  REFERRING PROVIDER: Dasie Fitch, MD  REFERRING DIAG: M75.21 (ICD-10-CM) - Bicipital tendinitis, right shoulder  THERAPY DIAG:  Right shoulder pain, unspecified chronicity  Muscle weakness (generalized)  Stiffness of right shoulder, not elsewhere classified  Rationale for Evaluation and Treatment: Rehabilitation  ONSET DATE: Nov 2024  SUBJECTIVE:                                                                                                                                                                                      SUBJECTIVE STATEMENT: Pt states she slept good after prior Rx.  Pt states she has had some inflammation issues starting 3 days ago, feeling like it is all over body.  She had a massage on Saturday.  Pt states massage therapist worked on her pec minor which felt better afterwards.     Hand dominance: Right  PERTINENT HISTORY: Bilat hip pain DM type 2,  obesity, migraine/HA's, anxiety  PAIN:  NPRS:  2/10 current at rest Location:  anterior shoulder.  Feels some tightness in posterior shoulder.  Type:  dull at rest, sharp with movement.  constant  PRECAUTIONS: None    WEIGHT BEARING RESTRICTIONS: No  FALLS:  Has patient fallen in last 6 months? 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  DIAGNOSTIC FINDINGS:  Pt had x rays though PT unable to view them.  MRI: FINDINGS:   Bones: Moderate AC joint arthrosis is present with reactive edema and mild hypertrophy. Mild glenohumeral arthrosis is present. No significant joint effusion. There is an indeterminate bubbly cystic lesion in the posterior lateral humeral head measuring 1.5 cm in maximum size. This could be a benign cyst or possibly chondroid lesion. No aggressive features are present. Correlation with x-ray recommended to evaluate for possible calcific matrix. Otherwise, the bones are unremarkable. There is no fracture or contusion pattern.   Rotator cuff: There is mild insertional tendinosis of the supraspinatus and infraspinatus tendons. There is no significant partial or full-thickness tear. There is a mild intrasubstance partial tear of the supraspinatus tendon. No full-thickness tear is present. No significant fatty atrophy of the rotator cuff muscles. The subscapularis and teres minor tendons are unremarkable.   Labrum and biceps tendon: Biceps tendon is intact. There is mild blunting of the superior labrum without a well defined superior labral tear. The anterior and posterior labrum are unremarkable.     IMPRESSION: Moderate AC joint arthrosis with  reactive edema and mild hypertrophy. Correlation for [facility] joint symptoms.   Mild insertional tendinosis of the supraspinatus tendon with a mild interstitial partial tear. No significant partial or full-thickness rotator cuff tear is present.   Biceps tendon is intact.   Slight blunting of the superior labrum without evidence of a discrete superior labral tear.  PATIENT SURVEYS:  UEFI:  36/80  COGNITION: Overall cognitive status: Within functional limits for tasks assessed      UPPER EXTREMITY ROM:   AROM/PROM Right eval Left eval Right 6/9 Right 7/2 Right 7/10 Right 8/27  Shoulder flexion 62/83 with pain 154 129  148 150  Shoulder scaption 83 with pain 150   133 145  Shoulder abduction 69 with pain 112 with pain  109 92 105  Shoulder adduction        Shoulder internal rotation        Shoulder external rotation 13/18 51 47  38/50 52  Elbow flexion        Elbow extension        Wrist flexion        Wrist extension        Wrist ulnar deviation        Wrist radial deviation        Wrist pronation        Wrist supination        (Blank rows = not tested)  UPPER EXTREMITY MMT:  MMT Right eval Left eval Right 7/10 Right 8/27  Shoulder flexion    4/5  Shoulder extension      Shoulder abduction      Shoulder adduction      Shoulder internal rotation      Shoulder external rotation   5/5 4/5  Middle trapezius      Lower trapezius      Elbow flexion      Elbow extension      Wrist flexion      Wrist extension      Wrist ulnar deviation      Wrist radial deviation      Wrist pronation      Wrist supination  Grip strength (lbs)      (Blank rows = not tested)  SHOULDER SPECIAL TESTS: Yergason's Test:  R: negative, L:  negative  Speed's Test:  R:  positive, L: negative                                                                                                                             TREATMENT DATE:   05/25/2024:  UBE lvl 1 x 5 mins (3' fwd/ 2'  bwd) Supine serratus punch 2# approx 12-15 reps, 3# x 10-12 Supine shoulder ABC x 1 reps  2# S/L ER 2# 2x8 Supine rhythmic stab's at 60/90/120 deg flexion 2x30 sec each Standing D2 flexion in front of mirror approx 7-8 reps and 6 reps Standing IR with RTB 3x10 Seated UT stretch 2x20 sec bilat  PT performed STM and TPR to bilat UT and R medial scap mm seated.    05/12/24: Reviewed pt presentation, pain levels, and response to prior Rx.   UBE lvl 1 x 4 mins  (2 mins each fwd/bwd) Standing rows with GTB with retraction 3x10 Standing shoulder extension with GTB 2x10 Standing shoulder IR with RTB  3x10 Supine shoulder ABC 2# x 1 rep Supine rhythmic stab's at 90 and 60 deg flexion 2x30 sec each S/L ER 1# x10, 2#2x10 Seated UT stretch 2x20 sec bilat  PT performed STM and TPR to R UT and medial scap mm seated.      PATIENT EDUCATION: Education details:  exercise form, dx, prognosis, rationale of interventions, and POC.  Using the theracane  Person educated: Patient Education method: Explanation, Demonstration, Tactile cues, Verbal cues, Education comprehension: verbalized understanding, returned demonstration, verbal cues required, tactile cues required, and needs further education  HOME EXERCISE PROGRAM: Access Code: [email or web address] URL: [email or web address] Date: 12/23/2023 Prepared by: Mose Minerva  Exercises - Seated Scapular Retraction  - 2 x daily - 7 x weekly - 2 sets - 10 reps - 3 seconds hold   - Seated Shoulder Flexion Towel Slide at Table Top  - 1-2 x daily - 7 x weekly - 1-2 sets - 10 reps - Supine Shoulder Flexion AAROM   - 1 x daily - 7 x weekly - 3 sets - 10 reps - Sidelying Shoulder External Rotation  - 1 x daily - 7 x weekly - 2 sets - 10 reps - Isometric Shoulder Flexion at Wall  - 1 x daily - 5 x weekly - 2 sets - 10 reps - 5 seconds hold - Supine Single Arm Shoulder Protraction  - 1 x daily - 7 x weekly - 2 sets - 10 reps - Supine Shoulder Alphabet   - 1 x daily - 7 x weekly - 1 reps - Standing Shoulder Row with Anchored Resistance  - 1 x daily - 4-5 x weekly - 2-3 sets - 10 reps  ASSESSMENT:  CLINICAL IMPRESSION: Pt continues to have soft tissue tightness and trigger points in UT and medial scap  mm and continues to report improved pain, sx's, and sleeping after soft tissue work in PT.  PT performed soft tissue work to bilat UT and R medial scap mm and pt had improved tightness and reduced trigger points after STM.  Pt performed therapeutic exercises and neuro re-ed activities well though had difficulty with standing D2 flexion.  Pt responded well to treatment having no increased pain after treatment.  Pt should continue to benefit from cont skilled PT to address ongoing goals and impairments and to restore desired level of function.    OBJECTIVE IMPAIRMENTS: decreased activity tolerance, decreased ROM, decreased strength, hypomobility, increased muscle spasms, impaired flexibility, impaired UE functional use, and pain.   ACTIVITY LIMITATIONS: carrying, lifting, sleeping, bathing, dressing, reach over head, and hygiene/grooming  PARTICIPATION LIMITATIONS: cleaning, driving, shopping, community activity, and occupation  PERSONAL FACTORS: 1 comorbidity: DM type 2 are also affecting patient's functional outcome.   REHAB POTENTIAL: Good  CLINICAL DECISION MAKING: Stable/uncomplicated  EVALUATION COMPLEXITY: Low   GOALS:   SHORT TERM GOALS: Target date: 01/13/2024   Pt will be independent and compliant with HEP for improved ROM, pain, strength, and function.  Baseline: Goal status: GOAL MET  8/27  2.  Pt will report at least a 25% improvement in pain and performance of ADL's.   Baseline: 40% Goal status: MET - 02/03/24  3.  Pt will be able to drive without increased pain.  Baseline: painful with driving in reverse Goal status: Partially MET 02/03/24 Target date:  01/20/2024  4.  Pt will demo at least a 30 deg increase in  flexion, abd, and scaption AROM and a 25 deg increase in ER AROM for improved reaching and performance of daily activities.  Baseline:  Goal status:  GOAL MET  03/30/24    LONG TERM GOALS: Target date: 06/23/2024  Pt will be able to reach overhead into a cabinet without difficulty and significant pain.  Baseline:  Goal status: Partially Met -02/03/24  2.  Pt will be able to perform her work activities without significant pain.  Baseline:  Goal status: NOT MET  3.  Pt will be able to perform self care activities/ADL's without difficulty and significant pain.   Baseline:  Goal status: PROGRESSING  4.  Pt will be able to perform her normal reaching activities without significant pain Baseline:  Goal status: PROGRESSING  5.  Pt will demo R shoulder AROM to be [facility] t/o for performance of ADLs and IADLs.  Baseline:  Goal status: PROGRESSING    PLAN:  PT FREQUENCY: 1-2x/week  PT DURATION: 6 weeks  PLANNED INTERVENTIONS: 97164- PT Re-evaluation, 97750- Physical Performance Testing, 97110-Therapeutic exercises, 97530- Therapeutic activity, [id number]- Neuromuscular re-education, 97535- Self Care, 02859- Manual therapy, [id number]- Aquatic Therapy, H9716- Electrical stimulation (unattended), [id number]- Electrical stimulation (manual), 97035- Ultrasound, Patient/Family education, Taping, Dry Needling, Joint mobilization, Spinal mobilization, Cryotherapy, and Moist heat  PLAN FOR NEXT SESSION: STM to UT and periscap mm.  Shoulder ROM.  Cont with strengthening per pt tolerance.    Leigh Minerva III PT, DPT 05/26/24 11:55 PM    [facility] [address] [place], [place], [address] Phone: [phone]   Fax:  [phone]

## 2024-05-26 ENCOUNTER — Encounter (HOSPITAL_BASED_OUTPATIENT_CLINIC_OR_DEPARTMENT_OTHER): Payer: Self-pay | Admitting: Physical Therapy

## 2024-05-27 ENCOUNTER — Other Ambulatory Visit: Payer: Self-pay | Admitting: *Deleted

## 2024-05-27 DIAGNOSIS — E7849 Other hyperlipidemia: Secondary | ICD-10-CM

## 2024-06-01 ENCOUNTER — Encounter: Payer: Self-pay | Admitting: Family Medicine

## 2024-06-01 ENCOUNTER — Other Ambulatory Visit (HOSPITAL_BASED_OUTPATIENT_CLINIC_OR_DEPARTMENT_OTHER): Payer: Self-pay

## 2024-06-01 ENCOUNTER — Encounter (HOSPITAL_BASED_OUTPATIENT_CLINIC_OR_DEPARTMENT_OTHER): Payer: Self-pay | Admitting: Physical Therapy

## 2024-06-01 ENCOUNTER — Ambulatory Visit (HOSPITAL_BASED_OUTPATIENT_CLINIC_OR_DEPARTMENT_OTHER): Admitting: Physical Therapy

## 2024-06-01 DIAGNOSIS — M25511 Pain in right shoulder: Secondary | ICD-10-CM | POA: Diagnosis not present

## 2024-06-01 DIAGNOSIS — E1169 Type 2 diabetes mellitus with other specified complication: Secondary | ICD-10-CM | POA: Diagnosis not present

## 2024-06-01 DIAGNOSIS — M25611 Stiffness of right shoulder, not elsewhere classified: Secondary | ICD-10-CM | POA: Diagnosis not present

## 2024-06-01 DIAGNOSIS — K5909 Other constipation: Secondary | ICD-10-CM | POA: Diagnosis not present

## 2024-06-01 DIAGNOSIS — E559 Vitamin D deficiency, unspecified: Secondary | ICD-10-CM | POA: Diagnosis not present

## 2024-06-01 DIAGNOSIS — M6281 Muscle weakness (generalized): Secondary | ICD-10-CM | POA: Diagnosis not present

## 2024-06-01 DIAGNOSIS — E7800 Pure hypercholesterolemia, unspecified: Secondary | ICD-10-CM | POA: Diagnosis not present

## 2024-06-01 MED ORDER — MOUNJARO 10 MG/0.5ML ~~LOC~~ SOAJ
0.5000 mL | SUBCUTANEOUS | 1 refills | Status: AC
  Filled 2024-06-01 – 2024-06-02 (×2): qty 6, 84d supply, fill #0
  Filled 2024-06-03: qty 2, 28d supply, fill #0
  Filled 2024-06-06 – 2024-06-15 (×4): qty 6, 84d supply, fill #0
  Filled 2024-08-31 – 2024-09-02 (×2): qty 6, 84d supply, fill #1

## 2024-06-01 NOTE — Therapy (Signed)
 OUTPATIENT PHYSICAL THERAPY SHOULDER TREATMENT     Patient Name: Aimee Hall MRN: [id number] DOB:05-14-1971, 53 y.o., female Today's Date: 06/02/2024  END OF SESSION:  PT End of Session - 06/01/24 1601     Visit Number 17    Number of Visits 23    Date for Recertification  06/23/24    Authorization Type BCBS    PT Start Time 1545    PT Stop Time 1626    PT Time Calculation (min) 41 min    Activity Tolerance Patient tolerated treatment well    Behavior During Therapy WFL for tasks assessed/performed                  Past Medical History:  Diagnosis Date   Asthma    Dehydration    Diabetes mellitus without complication (HCC)    Eczema    GERD (gastroesophageal reflux disease)    Syncopal episodes    Urticaria    Past Surgical History:  Procedure Laterality Date   NO PAST SURGERIES     Patient Active Problem List   Diagnosis Date Noted   Chronic migraine without aura, with intractable migraine, so stated, with status migrainosus 02/16/2023    PCP: Alvera Reagin, PA  REFERRING PROVIDER: Dasie Fitch, MD  REFERRING DIAG: M75.21 (ICD-10-CM) - Bicipital tendinitis, right shoulder  THERAPY DIAG:  Right shoulder pain, unspecified chronicity  Muscle weakness (generalized)  Stiffness of right shoulder, not elsewhere classified  Rationale for Evaluation and Treatment: Rehabilitation  ONSET DATE: Nov 2024  SUBJECTIVE:                                                                                                                                                                                      SUBJECTIVE STATEMENT: Pt states she slept good after prior Rx.  Pt still has the pinching pain though is better.  Pt states she has noticed her all over inflammation has been back the past week and seems to have worsened since having Covid.    Hand dominance: Right  PERTINENT HISTORY: 10# lifting restriction per sports med MD Bilat hip pain DM type 2,  obesity, migraine/HA's, anxiety  PAIN:  NPRS:  1/10 current  Location:  anterior and posterior shoulder.  Type:  dull at rest, sharp with movement.  constant  PRECAUTIONS: None    WEIGHT BEARING RESTRICTIONS: No  FALLS:  Has patient fallen in last 6 months? 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  DIAGNOSTIC FINDINGS:  Pt had x rays though PT unable to view them.  MRI: FINDINGS:   Bones: Moderate AC joint arthrosis is present with reactive edema and mild hypertrophy. Mild glenohumeral arthrosis is present. No significant joint effusion. There is an indeterminate bubbly cystic lesion in the posterior lateral humeral head measuring 1.5 cm in maximum size. This could be a benign cyst or possibly chondroid lesion. No aggressive features are present. Correlation with x-ray recommended to evaluate for possible calcific matrix. Otherwise, the bones are unremarkable. There is no fracture or contusion pattern.   Rotator cuff: There is mild insertional tendinosis of the supraspinatus and infraspinatus tendons. There is no significant partial or full-thickness tear. There is a mild intrasubstance partial tear of the supraspinatus tendon. No full-thickness tear is present. No significant fatty atrophy of the rotator cuff muscles. The subscapularis and teres minor tendons are unremarkable.   Labrum and biceps tendon: Biceps tendon is intact. There is mild blunting of the superior labrum without a well defined superior labral tear. The anterior and posterior labrum are unremarkable.     IMPRESSION: Moderate AC joint arthrosis with reactive edema and mild  hypertrophy. Correlation for [facility] joint symptoms.   Mild insertional tendinosis of the supraspinatus tendon with a mild interstitial partial tear. No significant partial or full-thickness rotator cuff tear is present.   Biceps tendon is intact.   Slight blunting of the superior labrum without evidence of a discrete superior labral tear.  PATIENT SURVEYS:  UEFI:  36/80  COGNITION: Overall cognitive status: Within functional limits for tasks assessed      UPPER EXTREMITY ROM:   AROM/PROM Right eval Left eval Right 6/9 Right 7/2 Right 7/10 Right 8/27  Shoulder flexion 62/83 with pain 154 129  148 150  Shoulder scaption 83 with pain 150   133 145  Shoulder abduction 69 with pain 112 with pain  109 92 105  Shoulder adduction        Shoulder internal rotation        Shoulder external rotation 13/18 51 47  38/50 52  Elbow flexion        Elbow extension        Wrist flexion        Wrist extension        Wrist ulnar deviation        Wrist radial deviation        Wrist pronation        Wrist supination        (Blank rows = not tested)  UPPER EXTREMITY MMT:  MMT Right eval Left eval Right 7/10 Right 8/27  Shoulder flexion    4/5  Shoulder extension      Shoulder abduction      Shoulder adduction      Shoulder internal rotation      Shoulder external rotation   5/5 4/5  Middle trapezius      Lower trapezius      Elbow flexion      Elbow extension      Wrist flexion      Wrist extension      Wrist ulnar deviation      Wrist radial deviation      Wrist pronation      Wrist supination      Grip strength (lbs)      (  Blank rows = not tested)  SHOULDER SPECIAL TESTS: Yergason's Test:  R: negative, L:  negative  Speed's Test:  R:  positive, L: negative                                                                                                                             TREATMENT DATE:   06/01/2024 UBE lvl 1 x 5 mins (3' fwd/ 2' bwd) Standing rows with  GTB with retraction 2x15 Standing shoulder extension with retraction 2x15 Standing IR with RTB 3x10 Standing ER with YTB 3x10 4D ball rolls with ball on wall 2x10 each Supine rhythmic stab's at 60/90/120 deg flexion 2x30 sec each  PT performed STM and TPR to R UT and R medial scap mm seated.   05/25/2024:  UBE lvl 1 x 5 mins (3' fwd/ 2' bwd) Supine serratus punch 2# approx 12-15 reps, 3# x 10-12 Supine shoulder ABC x 1 reps  2# S/L ER 2# 2x8 Supine rhythmic stab's at 60/90/120 deg flexion 2x30 sec each Standing D2 flexion in front of mirror approx 7-8 reps and 6 reps Standing IR with RTB 3x10 Seated UT stretch 2x20 sec bilat  PT performed STM and TPR to bilat UT and R medial scap mm seated.    05/12/24: Reviewed pt presentation, pain levels, and response to prior Rx.   UBE lvl 1 x 4 mins  (2 mins each fwd/bwd) Standing rows with GTB with retraction 3x10 Standing shoulder extension with GTB 2x10 Standing shoulder IR with RTB  3x10 Standing shoulder ER with YTB 3x10 Supine shoulder ABC 2# x 1 rep Supine rhythmic stab's at 90 and 60 deg flexion 2x30 sec each S/L ER 1# x10, 2#2x10 Seated UT stretch 2x20 sec bilat  PT performed STM and TPR to R UT and medial scap mm seated.      PATIENT EDUCATION: Education details:  exercise form, dx, prognosis, rationale of interventions, and POC.  Using the theracane  Person educated: Patient Education method: Explanation, Demonstration, Tactile cues, Verbal cues, Education comprehension: verbalized understanding, returned demonstration, verbal cues required, tactile cues required, and needs further education  HOME EXERCISE PROGRAM: Access Code: [email or web address] URL: [email or web address] Date: 12/23/2023 Prepared by: Mose Minerva  Exercises - Seated Scapular Retraction  - 2 x daily - 7 x weekly - 2 sets - 10 reps - 3 seconds hold   - Seated Shoulder Flexion Towel Slide at Table Top  - 1-2 x daily - 7 x weekly - 1-2 sets -  10 reps - Supine Shoulder Flexion AAROM   - 1 x daily - 7 x weekly - 3 sets - 10 reps - Sidelying Shoulder External Rotation  - 1 x daily - 7 x weekly - 2 sets - 10 reps - Isometric Shoulder Flexion at Wall  - 1 x daily - 5 x weekly - 2 sets - 10 reps - 5 seconds hold - Supine Single Arm Shoulder Protraction  - 1 x daily -  7 x weekly - 2 sets - 10 reps - Supine Shoulder Alphabet  - 1 x daily - 7 x weekly - 1 reps - Standing Shoulder Row with Anchored Resistance  - 1 x daily - 4-5 x weekly - 2-3 sets - 10 reps  ASSESSMENT:  CLINICAL IMPRESSION: Pt still has the pinching pain though is better.  Pt continues to report improved sleeping after soft tissue work.  Pt is progressing with strengthening and stabilization in R shoulder as evidenced by performance and progression of exercises.  Pt tolerated exercises well.  She was fatigued with standing ER with YTB though was able to complete today with better tolerance.  PT performed STM to UT and medial scap mm.  Though pt continues to have soft tissue tightness and trigger points, her overall tightness has improved.  She responded well to treatment having no c/o's after treatment.      OBJECTIVE IMPAIRMENTS: decreased activity tolerance, decreased ROM, decreased strength, hypomobility, increased muscle spasms, impaired flexibility, impaired UE functional use, and pain.   ACTIVITY LIMITATIONS: carrying, lifting, sleeping, bathing, dressing, reach over head, and hygiene/grooming  PARTICIPATION LIMITATIONS: cleaning, driving, shopping, community activity, and occupation  PERSONAL FACTORS: 1 comorbidity: DM type 2 are also affecting patient's functional outcome.   REHAB POTENTIAL: Good  CLINICAL DECISION MAKING: Stable/uncomplicated  EVALUATION COMPLEXITY: Low   GOALS:   SHORT TERM GOALS: Target date: 01/13/2024   Pt will be independent and compliant with HEP for improved ROM, pain, strength, and function.  Baseline: Goal status: GOAL MET   8/27  2.  Pt will report at least a 25% improvement in pain and performance of ADL's.   Baseline: 40% Goal status: MET - 02/03/24  3.  Pt will be able to drive without increased pain.  Baseline: painful with driving in reverse Goal status: Partially MET 02/03/24 Target date:  01/20/2024  4.  Pt will demo at least a 30 deg increase in flexion, abd, and scaption AROM and a 25 deg increase in ER AROM for improved reaching and performance of daily activities.  Baseline:  Goal status:  GOAL MET  03/30/24    LONG TERM GOALS: Target date: 06/23/2024  Pt will be able to reach overhead into a cabinet without difficulty and significant pain.  Baseline:  Goal status: Partially Met -02/03/24  2.  Pt will be able to perform her work activities without significant pain.  Baseline:  Goal status: NOT MET  3.  Pt will be able to perform self care activities/ADL's without difficulty and significant pain.   Baseline:  Goal status: PROGRESSING  4.  Pt will be able to perform her normal reaching activities without significant pain Baseline:  Goal status: PROGRESSING  5.  Pt will demo R shoulder AROM to be [facility] t/o for performance of ADLs and IADLs.  Baseline:  Goal status: PROGRESSING    PLAN:  PT FREQUENCY: 1-2x/week  PT DURATION: 6 weeks  PLANNED INTERVENTIONS: 97164- PT Re-evaluation, 97750- Physical Performance Testing, 97110-Therapeutic exercises, 97530- Therapeutic activity, [id number]- Neuromuscular re-education, 97535- Self Care, 02859- Manual therapy, [id number]- Aquatic Therapy, H9716- Electrical stimulation (unattended), [id number]- Electrical stimulation (manual), 97035- Ultrasound, Patient/Family education, Taping, Dry Needling, Joint mobilization, Spinal mobilization, Cryotherapy, and Moist heat  PLAN FOR NEXT SESSION: STM to UT and periscap mm.  Shoulder ROM.  Cont with strengthening per pt tolerance.    Leigh Minerva III PT, DPT 06/03/24 6:32 AM     [place] [facility] [address]  [place], [place], [address] Phone: [phone]   Fax:  [phone]

## 2024-06-02 ENCOUNTER — Other Ambulatory Visit (HOSPITAL_BASED_OUTPATIENT_CLINIC_OR_DEPARTMENT_OTHER): Payer: Self-pay

## 2024-06-03 ENCOUNTER — Other Ambulatory Visit (HOSPITAL_BASED_OUTPATIENT_CLINIC_OR_DEPARTMENT_OTHER): Payer: Self-pay

## 2024-06-03 MED ORDER — VITAMIN D (ERGOCALCIFEROL) 1.25 MG (50000 UNIT) PO CAPS
50000.0000 [IU] | ORAL_CAPSULE | ORAL | 0 refills | Status: DC
  Filled 2024-06-03 – 2024-07-04 (×3): qty 12, 84d supply, fill #0

## 2024-06-06 ENCOUNTER — Other Ambulatory Visit (HOSPITAL_BASED_OUTPATIENT_CLINIC_OR_DEPARTMENT_OTHER): Payer: Self-pay

## 2024-06-06 NOTE — Telephone Encounter (Signed)
 Sent mychart

## 2024-06-06 NOTE — Telephone Encounter (Signed)
 CPAP [employer] fed shara: [id number] (exp. 06/03/24 to 07/02/24)

## 2024-06-07 ENCOUNTER — Other Ambulatory Visit (HOSPITAL_BASED_OUTPATIENT_CLINIC_OR_DEPARTMENT_OTHER): Payer: Self-pay

## 2024-06-07 ENCOUNTER — Other Ambulatory Visit: Payer: Self-pay

## 2024-06-07 MED ORDER — CLINDAMYCIN PHOSPHATE 1 % EX LOTN
1.0000 | TOPICAL_LOTION | Freq: Every day | CUTANEOUS | 2 refills | Status: AC
  Filled 2024-06-07: qty 60, 30d supply, fill #0
  Filled 2024-07-01 – 2024-07-08 (×2): qty 60, 30d supply, fill #1

## 2024-06-07 NOTE — Telephone Encounter (Signed)
 Updated auth: CPAP [employer] fed shara: [id number] (exp. 06/03/24 to 07/18/24)

## 2024-06-08 ENCOUNTER — Other Ambulatory Visit (HOSPITAL_BASED_OUTPATIENT_CLINIC_OR_DEPARTMENT_OTHER): Payer: Self-pay

## 2024-06-09 ENCOUNTER — Other Ambulatory Visit (HOSPITAL_BASED_OUTPATIENT_CLINIC_OR_DEPARTMENT_OTHER): Payer: Self-pay

## 2024-06-09 ENCOUNTER — Encounter (HOSPITAL_BASED_OUTPATIENT_CLINIC_OR_DEPARTMENT_OTHER): Payer: Self-pay | Admitting: Physical Therapy

## 2024-06-09 MED ORDER — FREESTYLE LIBRE 14 DAY SENSOR MISC
1.0000 | 1 refills | Status: DC
  Filled 2024-06-09: qty 1, 14d supply, fill #0
  Filled 2024-06-09: qty 2, 28d supply, fill #0

## 2024-06-11 DIAGNOSIS — Z23 Encounter for immunization: Secondary | ICD-10-CM | POA: Diagnosis not present

## 2024-06-13 ENCOUNTER — Other Ambulatory Visit (HOSPITAL_BASED_OUTPATIENT_CLINIC_OR_DEPARTMENT_OTHER): Payer: Self-pay

## 2024-06-14 ENCOUNTER — Other Ambulatory Visit (HOSPITAL_BASED_OUTPATIENT_CLINIC_OR_DEPARTMENT_OTHER): Payer: Self-pay

## 2024-06-15 ENCOUNTER — Ambulatory Visit (HOSPITAL_BASED_OUTPATIENT_CLINIC_OR_DEPARTMENT_OTHER): Attending: Sports Medicine | Admitting: Physical Therapy

## 2024-06-15 ENCOUNTER — Other Ambulatory Visit (HOSPITAL_BASED_OUTPATIENT_CLINIC_OR_DEPARTMENT_OTHER): Payer: Self-pay

## 2024-06-15 ENCOUNTER — Encounter (HOSPITAL_BASED_OUTPATIENT_CLINIC_OR_DEPARTMENT_OTHER): Payer: Self-pay | Admitting: Physical Therapy

## 2024-06-15 DIAGNOSIS — M25611 Stiffness of right shoulder, not elsewhere classified: Secondary | ICD-10-CM | POA: Insufficient documentation

## 2024-06-15 DIAGNOSIS — M6281 Muscle weakness (generalized): Secondary | ICD-10-CM | POA: Insufficient documentation

## 2024-06-15 DIAGNOSIS — M25511 Pain in right shoulder: Secondary | ICD-10-CM | POA: Insufficient documentation

## 2024-06-15 MED ORDER — FREESTYLE LIBRE 3 PLUS SENSOR MISC
3 refills | Status: DC
  Filled 2024-06-15 – 2024-06-20 (×3): qty 2, 30d supply, fill #0

## 2024-06-15 NOTE — Therapy (Signed)
 OUTPATIENT PHYSICAL THERAPY SHOULDER TREATMENT     Patient Name: Aimee Hall MRN: [id number] DOB:1971/02/28, 53 y.o., female Today's Date: 06/16/2024  END OF SESSION:  PT End of Session - 06/15/24 1502     Visit Number 18    Number of Visits 23    Date for Recertification  06/23/24    Authorization Type BCBS    PT Start Time 1456    PT Stop Time 1548    PT Time Calculation (min) 52 min    Activity Tolerance Patient tolerated treatment well    Behavior During Therapy WFL for tasks assessed/performed                  Past Medical History:  Diagnosis Date   Asthma    Dehydration    Diabetes mellitus without complication (HCC)    Eczema    GERD (gastroesophageal reflux disease)    Syncopal episodes    Urticaria    Past Surgical History:  Procedure Laterality Date   NO PAST SURGERIES     Patient Active Problem List   Diagnosis Date Noted   Chronic migraine without aura, with intractable migraine, so stated, with status migrainosus 02/16/2023    PCP: Alvera Reagin, PA  REFERRING PROVIDER: Dasie Fitch, MD  REFERRING DIAG: M75.21 (ICD-10-CM) - Bicipital tendinitis, right shoulder  THERAPY DIAG:  Right shoulder pain, unspecified chronicity  Muscle weakness (generalized)  Stiffness of right shoulder, not elsewhere classified  Rationale for Evaluation and Treatment: Rehabilitation  ONSET DATE: Nov 2024  SUBJECTIVE:                                                                                                                                                                                      SUBJECTIVE STATEMENT: Pt states she could tell she wasn't in PT last week.  She had increased stiffness in shoulder when not coming to PT last week.  Pt reports she slept good after prior treatment and always sleeps well after PT.  Her pinching pain is not all the time, but she still has it.  Pt states PT and her massage therapist release that area and she  feels much better.  Pt thinks the tightness is from using her mouse.  She has been using heat at work which helps.    Hand dominance: Right  PERTINENT HISTORY: 10# lifting restriction per sports med MD Bilat hip pain DM type 2, obesity, migraine/HA's, anxiety  PAIN:  NPRS:  1/10 current  Location:  anterior and posterior shoulder.  Type:  dull at rest, sharp with movement.  constant  PRECAUTIONS: None    WEIGHT BEARING RESTRICTIONS: No  FALLS:  Has patient fallen in last 6 months? 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  DIAGNOSTIC FINDINGS:  Pt had x rays though PT unable to view them.  MRI: FINDINGS:   Bones: Moderate AC joint arthrosis is present with reactive edema and mild hypertrophy. Mild glenohumeral arthrosis is present. No significant joint effusion. There is an indeterminate bubbly cystic lesion in the posterior lateral humeral head measuring 1.5 cm in maximum size. This could be a benign cyst or possibly chondroid lesion. No aggressive features are present. Correlation with x-ray recommended to evaluate for possible calcific matrix. Otherwise, the bones are unremarkable. There is no fracture or contusion pattern.   Rotator cuff: There is mild insertional tendinosis of the supraspinatus and infraspinatus tendons. There is no significant partial or full-thickness tear. There is a mild intrasubstance partial tear of the supraspinatus tendon. No full-thickness tear is present. No significant fatty atrophy of the rotator cuff muscles. The subscapularis and teres minor tendons are unremarkable.   Labrum and biceps tendon: Biceps tendon is intact.  There is mild blunting of the superior labrum without a well defined superior labral tear. The anterior and posterior labrum are unremarkable.     IMPRESSION: Moderate AC joint arthrosis with reactive edema and mild hypertrophy. Correlation for [facility] joint symptoms.   Mild insertional tendinosis of the supraspinatus tendon with a mild interstitial partial tear. No significant partial or full-thickness rotator cuff tear is present.   Biceps tendon is intact.   Slight blunting of the superior labrum without evidence of a discrete superior labral tear.  PATIENT SURVEYS:  UEFI:  36/80  COGNITION: Overall cognitive status: Within functional limits for tasks assessed      UPPER EXTREMITY ROM:   AROM/PROM Right eval Left eval Right 6/9 Right 7/2 Right 7/10 Right 8/27  Shoulder flexion 62/83 with pain 154 129  148 150  Shoulder scaption 83 with pain 150   133 145  Shoulder abduction 69 with pain 112 with pain  109 92 105  Shoulder adduction        Shoulder internal rotation        Shoulder external rotation 13/18 51 47  38/50 52  Elbow flexion        Elbow extension        Wrist flexion        Wrist extension        Wrist ulnar deviation        Wrist radial deviation        Wrist pronation        Wrist supination        (Blank rows = not tested)  UPPER EXTREMITY MMT:  MMT Right eval Left eval Right 7/10 Right 8/27  Shoulder flexion    4/5  Shoulder extension      Shoulder abduction      Shoulder adduction      Shoulder internal rotation      Shoulder external rotation   5/5 4/5  Middle trapezius      Lower trapezius      Elbow flexion      Elbow extension      Wrist flexion  Wrist extension      Wrist ulnar deviation      Wrist radial deviation      Wrist pronation      Wrist supination      Grip strength (lbs)      (Blank rows = not tested)  SHOULDER SPECIAL TESTS: Yergason's Test:  R: negative, L:  negative  Speed's Test:  R:  positive, L:  negative                                                                                                                             TREATMENT DATE:   06/15/24 UBE x 4 mins (2 mins each fwd/bwd) Supine rhythmic stab's at 60/90/120 deg flexion 2x30 sec each S/L ER 2# 2x10 Standing IR with RTB 3x10 Standing ER with YTB 2x10 4D ball rolls with ball on wall 2x10 each ABC with ball on wall x 1 reps  Updated HEP.  Pt received a HEP handout and was educated in correct form and appropriate frequency.   PT performed STM and TPR to R UT and R medial scap mm seated.    06/01/2024 UBE lvl 1 x 5 mins (3' fwd/ 2' bwd) Standing rows with GTB with retraction 2x15 Standing shoulder extension with retraction 2x15 Standing IR with RTB 3x10 Standing ER with YTB 3x10 4D ball rolls with ball on wall 2x10 each Supine rhythmic stab's at 60/90/120 deg flexion 2x30 sec each  PT performed STM and TPR to R UT and R medial scap mm seated.   05/25/2024:  UBE lvl 1 x 5 mins (3' fwd/ 2' bwd) S/L ER 2# 2x10 Supine rhythmic stab's at 60/90/120 deg flexion 2x30 sec each Standing ER with YTB 2x10 Standing IR with RTB 3x10 Seated UT stretch 2x20 sec bilat  PT performed STM and TPR to bilat UT and R medial scap mm seated.    05/12/24: Reviewed pt presentation, pain levels, and response to prior Rx.   UBE lvl 1 x 4 mins  (2 mins each fwd/bwd) Standing rows with GTB with retraction 3x10 Standing shoulder extension with GTB 2x10 Standing shoulder IR with RTB  3x10 Standing shoulder ER with YTB 3x10 Supine shoulder ABC 2# x 1 rep Supine rhythmic stab's at 90 and 60 deg flexion 2x30 sec each S/L ER 1# x10, 2#2x10 Seated UT stretch 2x20 sec bilat  PT performed STM and TPR to R UT and medial scap mm seated.      PATIENT EDUCATION: Education details:  exercise form, dx, prognosis, HEP, rationale of interventions, and POC.  Using the theracane  Person educated: Patient Education method: Explanation,  Demonstration, Tactile cues, Verbal cues, Education comprehension: verbalized understanding, returned demonstration, verbal cues required, tactile cues required, and needs further education  HOME EXERCISE PROGRAM: Access Code: [email or web address] URL: [email or web address] Date: 12/23/2023 Prepared by: Mose Minerva  Exercises - Seated Scapular Retraction  - 2 x daily - 7 x weekly - 2 sets - 10 reps -  3 seconds hold   - Seated Shoulder Flexion Towel Slide at Table Top  - 1-2 x daily - 7 x weekly - 1-2 sets - 10 reps - Supine Shoulder Flexion AAROM   - 1 x daily - 7 x weekly - 3 sets - 10 reps - Sidelying Shoulder External Rotation  - 1 x daily - 7 x weekly - 2 sets - 10 reps - Isometric Shoulder Flexion at Wall  - 1 x daily - 5 x weekly - 2 sets - 10 reps - 5 seconds hold - Supine Single Arm Shoulder Protraction  - 1 x daily - 7 x weekly - 2 sets - 10 reps - Supine Shoulder Alphabet  - 1 x daily - 7 x weekly - 1 reps - Standing Shoulder Row with Anchored Resistance  - 1 x daily - 4-5 x weekly - 2-3 sets - 10 reps  Updated HEP: - Shoulder extension with resistance - Neutral  - 4-5 x weekly - 2-3 sets - 10 reps - Sidelying Shoulder External Rotation  - 1 x daily - 3 x weekly - 2 sets - 10 reps - Seated Upper Trapezius Stretch  - 2 x daily - 7 x weekly - 2-3 reps - 20 seconds hold  ASSESSMENT:  CLINICAL IMPRESSION: Pt continues to report significant improvement in sx's, pain, and sleeping after soft tissue work to UT.  She is performing soft tissue work at home with theracane though states it's not the same as PT and massage therapist.  PT educated pt in long term management.  PT instructed pt in correct form with UT stretches for home and to improve tightness and sx's.  PT gave pt a handout of seated UT stretch.  She continues to have anterior shoulder pinching pain though reports improvement. Pt has improved tolerance with exercises.  She performed exercises well though is fatigued  with exercises.  PT updated HEP and gave pt a HEP handout.  Pt demonstrates good understanding of HEP.  Pt responded well to treatment and reports improved sx's after STM and TPR.       OBJECTIVE IMPAIRMENTS: decreased activity tolerance, decreased ROM, decreased strength, hypomobility, increased muscle spasms, impaired flexibility, impaired UE functional use, and pain.   ACTIVITY LIMITATIONS: carrying, lifting, sleeping, bathing, dressing, reach over head, and hygiene/grooming  PARTICIPATION LIMITATIONS: cleaning, driving, shopping, community activity, and occupation  PERSONAL FACTORS: 1 comorbidity: DM type 2 are also affecting patient's functional outcome.   REHAB POTENTIAL: Good  CLINICAL DECISION MAKING: Stable/uncomplicated  EVALUATION COMPLEXITY: Low   GOALS:   SHORT TERM GOALS: Target date: 01/13/2024   Pt will be independent and compliant with HEP for improved ROM, pain, strength, and function.  Baseline: Goal status: GOAL MET  8/27  2.  Pt will report at least a 25% improvement in pain and performance of ADL's.   Baseline: 40% Goal status: MET - 02/03/24  3.  Pt will be able to drive without increased pain.  Baseline: painful with driving in reverse Goal status: Partially MET 02/03/24 Target date:  01/20/2024  4.  Pt will demo at least a 30 deg increase in flexion, abd, and scaption AROM and a 25 deg increase in ER AROM for improved reaching and performance of daily activities.  Baseline:  Goal status:  GOAL MET  03/30/24    LONG TERM GOALS: Target date: 06/23/2024  Pt will be able to reach overhead into a cabinet without difficulty and significant pain.  Baseline:  Goal status: Partially  Met -02/03/24  2.  Pt will be able to perform her work activities without significant pain.  Baseline:  Goal status: NOT MET  3.  Pt will be able to perform self care activities/ADL's without difficulty and significant pain.   Baseline:  Goal status: PROGRESSING  4.  Pt  will be able to perform her normal reaching activities without significant pain Baseline:  Goal status: PROGRESSING  5.  Pt will demo R shoulder AROM to be [facility] t/o for performance of ADLs and IADLs.  Baseline:  Goal status: PROGRESSING    PLAN:  PT FREQUENCY: 1-2x/week  PT DURATION: 6 weeks  PLANNED INTERVENTIONS: 97164- PT Re-evaluation, 97750- Physical Performance Testing, 97110-Therapeutic exercises, 97530- Therapeutic activity, [id number]- Neuromuscular re-education, 97535- Self Care, [id number]- Manual therapy, [id number]- Aquatic Therapy, [id number]- Electrical stimulation (unattended), [id number]- Electrical stimulation (manual), 97035- Ultrasound, Patient/Family education, Taping, Dry Needling, Joint mobilization, Spinal mobilization, Cryotherapy, and Moist heat  PLAN FOR NEXT SESSION: STM to UT and periscap mm.  Shoulder ROM.  Cont with strengthening per pt tolerance.    Leigh Minerva III PT, DPT 06/16/24 9:01 PM     [facility] [address] [place], [place], [address] Phone: [phone]   Fax:  [phone]

## 2024-06-16 ENCOUNTER — Other Ambulatory Visit (HOSPITAL_BASED_OUTPATIENT_CLINIC_OR_DEPARTMENT_OTHER): Payer: Self-pay

## 2024-06-17 ENCOUNTER — Other Ambulatory Visit (HOSPITAL_BASED_OUTPATIENT_CLINIC_OR_DEPARTMENT_OTHER): Payer: Self-pay

## 2024-06-20 ENCOUNTER — Other Ambulatory Visit (HOSPITAL_BASED_OUTPATIENT_CLINIC_OR_DEPARTMENT_OTHER): Payer: Self-pay

## 2024-06-21 ENCOUNTER — Other Ambulatory Visit: Payer: Self-pay

## 2024-06-21 ENCOUNTER — Other Ambulatory Visit (HOSPITAL_BASED_OUTPATIENT_CLINIC_OR_DEPARTMENT_OTHER): Payer: Self-pay

## 2024-06-21 MED ORDER — FREESTYLE LIBRE 2 SENSOR MISC
3 refills | Status: AC
  Filled 2024-06-21: qty 2, 28d supply, fill #0
  Filled 2024-07-13: qty 2, 28d supply, fill #1
  Filled 2024-07-24 – 2024-08-03 (×2): qty 2, 28d supply, fill #2
  Filled 2024-09-07: qty 2, 28d supply, fill #3

## 2024-06-21 MED ORDER — FREESTYLE LIBRE 2 READER DEVI
3 refills | Status: AC
Start: 2024-06-21 — End: ?
  Filled 2024-06-21: qty 1, 30d supply, fill #0
  Filled 2024-07-15: qty 1, 30d supply, fill #1

## 2024-06-22 ENCOUNTER — Other Ambulatory Visit (HOSPITAL_BASED_OUTPATIENT_CLINIC_OR_DEPARTMENT_OTHER): Payer: Self-pay

## 2024-06-22 ENCOUNTER — Ambulatory Visit (HOSPITAL_BASED_OUTPATIENT_CLINIC_OR_DEPARTMENT_OTHER): Admitting: Physical Therapy

## 2024-06-22 DIAGNOSIS — M25511 Pain in right shoulder: Secondary | ICD-10-CM | POA: Diagnosis not present

## 2024-06-22 DIAGNOSIS — M6281 Muscle weakness (generalized): Secondary | ICD-10-CM

## 2024-06-22 DIAGNOSIS — M25611 Stiffness of right shoulder, not elsewhere classified: Secondary | ICD-10-CM | POA: Diagnosis not present

## 2024-06-22 NOTE — Therapy (Signed)
 OUTPATIENT PHYSICAL THERAPY SHOULDER TREATMENT     Patient Name: Aimee Hall MRN: [id number] DOB:Jul 19, 1971, 53 y.o., female Today's Date: 06/23/2024  END OF SESSION:  PT End of Session - 06/22/24 1534     Visit Number 19    Number of Visits 23    Date for Recertification  06/23/24    PT Start Time 1533    PT Stop Time 1618    PT Time Calculation (min) 45 min    Activity Tolerance Patient tolerated treatment well    Behavior During Therapy WFL for tasks assessed/performed                  Past Medical History:  Diagnosis Date   Asthma    Dehydration    Diabetes mellitus without complication (HCC)    Eczema    GERD (gastroesophageal reflux disease)    Syncopal episodes    Urticaria    Past Surgical History:  Procedure Laterality Date   NO PAST SURGERIES     Patient Active Problem List   Diagnosis Date Noted   Chronic migraine without aura, with intractable migraine, so stated, with status migrainosus 02/16/2023    PCP: Alvera Reagin, PA  REFERRING PROVIDER: Dasie Fitch, MD  REFERRING DIAG: M75.21 (ICD-10-CM) - Bicipital tendinitis, right shoulder  THERAPY DIAG:  Right shoulder pain, unspecified chronicity  Muscle weakness (generalized)  Stiffness of right shoulder, not elsewhere classified  Rationale for Evaluation and Treatment: Rehabilitation  ONSET DATE: Nov 2024  SUBJECTIVE:                                                                                                                                                                                      SUBJECTIVE STATEMENT: Pt reports increased pain and didn't sleep well after last treatment.  Pt reports it was the ball exercises.  Pt used ice and heating pad the following day which helped.  Pt just got off work and is tired and sore.  Pt states she had a massage on Saturday and they worked on her pec minor.   Hand dominance: Right  PERTINENT HISTORY: 10# lifting restriction per  sports med MD Bilat hip pain DM type 2, obesity, migraine/HA's, anxiety  PAIN:  NPRS:  2/10 current  Location:  anterior shoulder.  Type:  dull at rest, sharp with movement.  constant  PRECAUTIONS: None    WEIGHT BEARING RESTRICTIONS: No  FALLS:  Has patient fallen in last 6 months? 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  DIAGNOSTIC FINDINGS:  Pt had x rays though PT unable to view them.  MRI: FINDINGS:   Bones: Moderate AC joint arthrosis is present with reactive edema and mild hypertrophy. Mild glenohumeral arthrosis is present. No significant joint effusion. There is an indeterminate bubbly cystic lesion in the posterior lateral humeral head measuring 1.5 cm in maximum size. This could be a benign cyst or possibly chondroid lesion. No aggressive features are present. Correlation with x-ray recommended to evaluate for possible calcific matrix. Otherwise, the bones are unremarkable. There is no fracture or contusion pattern.   Rotator cuff: There is mild insertional tendinosis of the supraspinatus and infraspinatus tendons. There is no significant partial or full-thickness tear. There is a mild intrasubstance partial tear of the supraspinatus tendon. No full-thickness tear is present. No significant fatty atrophy of the rotator cuff muscles. The subscapularis and teres minor tendons are unremarkable.   Labrum and biceps tendon: Biceps tendon is intact. There is mild blunting of the superior labrum without a well defined superior labral tear. The anterior and posterior labrum are unremarkable.     IMPRESSION: Moderate AC joint arthrosis with reactive  edema and mild hypertrophy. Correlation for [facility] joint symptoms.   Mild insertional tendinosis of the supraspinatus tendon with a mild interstitial partial tear. No significant partial or full-thickness rotator cuff tear is present.   Biceps tendon is intact.   Slight blunting of the superior labrum without evidence of a discrete superior labral tear.  PATIENT SURVEYS:  UEFI:  36/80  COGNITION: Overall cognitive status: Within functional limits for tasks assessed      UPPER EXTREMITY ROM:   AROM/PROM Right eval Left eval Right 6/9 Right 7/2 Right 7/10 Right 8/27  Shoulder flexion 62/83 with pain 154 129  148 150  Shoulder scaption 83 with pain 150   133 145  Shoulder abduction 69 with pain 112 with pain  109 92 105  Shoulder adduction        Shoulder internal rotation        Shoulder external rotation 13/18 51 47  38/50 52  Elbow flexion        Elbow extension        Wrist flexion        Wrist extension        Wrist ulnar deviation        Wrist radial deviation        Wrist pronation        Wrist supination        (Blank rows = not tested)  UPPER EXTREMITY MMT:  MMT Right eval Left eval Right 7/10 Right 8/27  Shoulder flexion    4/5  Shoulder extension      Shoulder abduction      Shoulder adduction      Shoulder internal rotation      Shoulder external rotation   5/5 4/5  Middle trapezius      Lower trapezius      Elbow flexion      Elbow extension      Wrist flexion      Wrist extension      Wrist ulnar deviation      Wrist radial deviation      Wrist pronation      Wrist supination  Grip strength (lbs)      (Blank rows = not tested)  SHOULDER SPECIAL TESTS: Yergason's Test:  R: negative, L:  negative  Speed's Test:  R:  positive, L: negative                                                                                                                             TREATMENT DATE:   06/22/24 UBE x 4 mins (2 mins each fwd/bwd) Supine  shoulder ABC x 1 rep with 2# Supine serratus punch with 2#  3x10 Supine rhythmic stab's at 60/90/120 deg flexion 2x30 sec each S/L ER 2# 2x10 Standing IR with RTB 3x10 Standing ER with YTB 2x10  PT performed STM and TPR to R UT and R medial scap mm seated.    06/15/24 UBE x 4 mins (2 mins each fwd/bwd) Supine rhythmic stab's at 60/90/120 deg flexion 2x30 sec each S/L ER 2# 2x10 Standing IR with RTB 3x10 Standing ER with YTB 2x10 4D ball rolls with ball on wall 2x10 each ABC with ball on wall x 1 reps  Updated HEP.  Pt received a HEP handout and was educated in correct form and appropriate frequency.   PT performed STM and TPR to R UT and R medial scap mm seated.    06/01/2024 UBE lvl 1 x 5 mins (3' fwd/ 2' bwd) Standing rows with GTB with retraction 2x15 Standing shoulder extension with retraction 2x15 Standing IR with RTB 3x10 Standing ER with YTB 3x10 4D ball rolls with ball on wall 2x10 each Supine rhythmic stab's at 60/90/120 deg flexion 2x30 sec each  PT performed STM and TPR to R UT and R medial scap mm seated.   05/25/2024:  UBE lvl 1 x 5 mins (3' fwd/ 2' bwd) S/L ER 2# 2x10 Supine rhythmic stab's at 60/90/120 deg flexion 2x30 sec each Standing ER with YTB 2x10 Standing IR with RTB 3x10 Seated UT stretch 2x20 sec bilat  PT performed STM and TPR to bilat UT and R medial scap mm seated.    05/12/24: Reviewed pt presentation, pain levels, and response to prior Rx.   UBE lvl 1 x 4 mins  (2 mins each fwd/bwd) Standing rows with GTB with retraction 3x10 Standing shoulder extension with GTB 2x10 Standing shoulder IR with RTB  3x10 Standing shoulder ER with YTB 3x10 Supine shoulder ABC 2# x 1 rep Supine rhythmic stab's at 90 and 60 deg flexion 2x30 sec each S/L ER 1# x10, 2#2x10 Seated UT stretch 2x20 sec bilat  PT performed STM and TPR to R UT and medial scap mm seated.      PATIENT EDUCATION: Education details:  exercise form, dx, prognosis, HEP,  rationale of interventions, and POC.  Using the theracane  Person educated: Patient Education method: Explanation, Demonstration, Tactile cues, Verbal cues, Education comprehension: verbalized understanding, returned demonstration, verbal cues required, tactile cues required, and needs further education  HOME EXERCISE PROGRAM: Access Code: [email or web address]  URL: [email or web address] Date: 12/23/2023 Prepared by: Mose Minerva  Exercises - Seated Scapular Retraction  - 2 x daily - 7 x weekly - 2 sets - 10 reps - 3 seconds hold   - Seated Shoulder Flexion Towel Slide at Table Top  - 1-2 x daily - 7 x weekly - 1-2 sets - 10 reps - Supine Shoulder Flexion AAROM   - 1 x daily - 7 x weekly - 3 sets - 10 reps - Sidelying Shoulder External Rotation  - 1 x daily - 7 x weekly - 2 sets - 10 reps - Isometric Shoulder Flexion at Wall  - 1 x daily - 5 x weekly - 2 sets - 10 reps - 5 seconds hold - Supine Single Arm Shoulder Protraction  - 1 x daily - 7 x weekly - 2 sets - 10 reps - Supine Shoulder Alphabet  - 1 x daily - 7 x weekly - 1 reps - Standing Shoulder Row with Anchored Resistance  - 1 x daily - 4-5 x weekly - 2-3 sets - 10 reps  Updated HEP: - Shoulder extension with resistance - Neutral  - 4-5 x weekly - 2-3 sets - 10 reps - Sidelying Shoulder External Rotation  - 1 x daily - 3 x weekly - 2 sets - 10 reps - Seated Upper Trapezius Stretch  - 2 x daily - 7 x weekly - 2-3 reps - 20 seconds hold  ASSESSMENT:  CLINICAL IMPRESSION: Pt had increased pain and difficulty sleeping after prior treatment which she attributes to the ball on the wall exercises.  PT didn't perform ball rolls on wall and ABC's on wall due to recent response.  She performed and tolerated therapeutic exercises and neuro re-ed activities well.  PT performed STM and TPR to UT ad medial scap mm.  She continues to have tightness and trigger points present.  PT instructed pt in continuing soft tissue work and UT stretch at  home.  Pt responded well to treatment and had no c/o's after treatment.      OBJECTIVE IMPAIRMENTS: decreased activity tolerance, decreased ROM, decreased strength, hypomobility, increased muscle spasms, impaired flexibility, impaired UE functional use, and pain.   ACTIVITY LIMITATIONS: carrying, lifting, sleeping, bathing, dressing, reach over head, and hygiene/grooming  PARTICIPATION LIMITATIONS: cleaning, driving, shopping, community activity, and occupation  PERSONAL FACTORS: 1 comorbidity: DM type 2 are also affecting patient's functional outcome.   REHAB POTENTIAL: Good  CLINICAL DECISION MAKING: Stable/uncomplicated  EVALUATION COMPLEXITY: Low   GOALS:   SHORT TERM GOALS: Target date: 01/13/2024   Pt will be independent and compliant with HEP for improved ROM, pain, strength, and function.  Baseline: Goal status: GOAL MET  8/27  2.  Pt will report at least a 25% improvement in pain and performance of ADL's.   Baseline: 40% Goal status: MET - 02/03/24  3.  Pt will be able to drive without increased pain.  Baseline: painful with driving in reverse Goal status: Partially MET 02/03/24 Target date:  01/20/2024  4.  Pt will demo at least a 30 deg increase in flexion, abd, and scaption AROM and a 25 deg increase in ER AROM for improved reaching and performance of daily activities.  Baseline:  Goal status:  GOAL MET  03/30/24    LONG TERM GOALS: Target date: 06/23/2024  Pt will be able to reach overhead into a cabinet without difficulty and significant pain.  Baseline:  Goal status: Partially Met -02/03/24  2.  Pt will be able  to perform her work activities without significant pain.  Baseline:  Goal status: NOT MET  3.  Pt will be able to perform self care activities/ADL's without difficulty and significant pain.   Baseline:  Goal status: PROGRESSING  4.  Pt will be able to perform her normal reaching activities without significant pain Baseline:  Goal status:  PROGRESSING  5.  Pt will demo R shoulder AROM to be [facility] t/o for performance of ADLs and IADLs.  Baseline:  Goal status: PROGRESSING    PLAN:  PT FREQUENCY: 1-2x/week  PT DURATION: 6 weeks  PLANNED INTERVENTIONS: [id number]- PT Re-evaluation, 97750- Physical Performance Testing, 97110-Therapeutic exercises, 97530- Therapeutic activity, [id number]- Neuromuscular re-education, 97535- Self Care, [id number]- Manual therapy, [id number]- Aquatic Therapy, [id number]- Electrical stimulation (unattended), [id number]- Electrical stimulation (manual), 97035- Ultrasound, Patient/Family education, Taping, Dry Needling, Joint mobilization, Spinal mobilization, Cryotherapy, and Moist heat  PLAN FOR NEXT SESSION: STM to UT and periscap mm.  Shoulder ROM.  Cont with strengthening per pt tolerance.    Leigh Minerva III PT, DPT 06/24/24 10:16 AM      [facility] [address] [place], [place], [address] Phone: [phone]   Fax:  [phone]

## 2024-06-23 ENCOUNTER — Encounter (HOSPITAL_BASED_OUTPATIENT_CLINIC_OR_DEPARTMENT_OTHER): Payer: Self-pay | Admitting: Physical Therapy

## 2024-06-27 ENCOUNTER — Encounter (HOSPITAL_BASED_OUTPATIENT_CLINIC_OR_DEPARTMENT_OTHER): Admitting: Physical Therapy

## 2024-06-27 DIAGNOSIS — E559 Vitamin D deficiency, unspecified: Secondary | ICD-10-CM | POA: Diagnosis not present

## 2024-06-27 DIAGNOSIS — E1169 Type 2 diabetes mellitus with other specified complication: Secondary | ICD-10-CM | POA: Diagnosis not present

## 2024-06-27 DIAGNOSIS — E7800 Pure hypercholesterolemia, unspecified: Secondary | ICD-10-CM | POA: Diagnosis not present

## 2024-06-27 DIAGNOSIS — K5909 Other constipation: Secondary | ICD-10-CM | POA: Diagnosis not present

## 2024-07-01 ENCOUNTER — Other Ambulatory Visit: Payer: Self-pay

## 2024-07-01 ENCOUNTER — Other Ambulatory Visit (HOSPITAL_BASED_OUTPATIENT_CLINIC_OR_DEPARTMENT_OTHER): Payer: Self-pay

## 2024-07-04 ENCOUNTER — Other Ambulatory Visit (HOSPITAL_BASED_OUTPATIENT_CLINIC_OR_DEPARTMENT_OTHER): Payer: Self-pay

## 2024-07-04 ENCOUNTER — Other Ambulatory Visit: Payer: Self-pay

## 2024-07-06 ENCOUNTER — Other Ambulatory Visit (HOSPITAL_BASED_OUTPATIENT_CLINIC_OR_DEPARTMENT_OTHER): Payer: Self-pay

## 2024-07-06 MED ORDER — ACETAMINOPHEN-CODEINE 300-15 MG PO TABS
ORAL_TABLET | ORAL | 0 refills | Status: AC
  Filled 2024-07-06: qty 10, 10d supply, fill #0

## 2024-07-07 ENCOUNTER — Other Ambulatory Visit: Payer: Self-pay

## 2024-07-07 ENCOUNTER — Other Ambulatory Visit (HOSPITAL_BASED_OUTPATIENT_CLINIC_OR_DEPARTMENT_OTHER): Payer: Self-pay

## 2024-07-07 ENCOUNTER — Encounter (HOSPITAL_BASED_OUTPATIENT_CLINIC_OR_DEPARTMENT_OTHER): Admitting: Physical Therapy

## 2024-07-07 DIAGNOSIS — G43909 Migraine, unspecified, not intractable, without status migrainosus: Secondary | ICD-10-CM | POA: Diagnosis not present

## 2024-07-08 ENCOUNTER — Other Ambulatory Visit: Payer: Self-pay

## 2024-07-10 NOTE — Therapy (Signed)
 OUTPATIENT PHYSICAL THERAPY SHOULDER TREATMENT    PROGRESS NOTE   Patient Name: Aimee Hall MRN: [id number] DOB:18-May-1971, 53 y.o., female Today's Date: 07/12/2024  END OF SESSION:  PT End of Session - 07/11/24 1501     Visit Number 20    Number of Visits 23    Date for Recertification  08/15/24    Authorization Type [employer]    PT Start Time 1455    PT Stop Time 1540    PT Time Calculation (min) 45 min    Activity Tolerance Patient tolerated treatment well    Behavior During Therapy WFL for tasks assessed/performed                   Past Medical History:  Diagnosis Date   Asthma    Dehydration    Diabetes mellitus without complication (HCC)    Eczema    GERD (gastroesophageal reflux disease)    Syncopal episodes    Urticaria    Past Surgical History:  Procedure Laterality Date   NO PAST SURGERIES     Patient Active Problem List   Diagnosis Date Noted   Chronic migraine without aura, with intractable migraine, so stated, with status migrainosus 02/16/2023    PCP: Alvera Reagin, PA  REFERRING PROVIDER: Dasie Fitch, MD  REFERRING DIAG: M75.21 (ICD-10-CM) - Bicipital tendinitis, right shoulder  THERAPY DIAG:  Right shoulder pain, unspecified chronicity  Muscle weakness (generalized)  Stiffness of right shoulder, not elsewhere classified  Rationale for Evaluation and Treatment: Rehabilitation  ONSET DATE: Nov 2024  SUBJECTIVE:                                                                                                                                                                                      SUBJECTIVE STATEMENT: Pt states she has no pain.  Pt states she thinks it's inflammation that causes pain.  Pt states she gets inflammation all over her body and then her shoulder hurts worse.  Pt states she did well after prior treatment and had a good sleep.  Pt reports a 90% improvement overall in pain and sx's.  Pt is driving without  increased pain.  Pt reports improved pain with work activities.  Pt is able to reach overhead into a cabinet with minimal difficulty and without significant pain.   Pt continues to have pain with self care activities/ADL's though is better including with hair care.  Pt is able to perform her normal reaching activities without significant pain, only minimal pain. Pt reports compliance with HEP.    Pt had a migraine last week and received an injection of Toradol  on Thursday.  Hand dominance: Right  PERTINENT HISTORY: 10# lifting restriction per sports med MD Bilat hip pain DM type 2, obesity, migraine/HA's, anxiety  PAIN:  NPRS:  0/10 current, 3/10 worst pain  Location:  anterior shoulder.  Type:  dull at rest, sharp with movement.  constant  PRECAUTIONS: None    WEIGHT BEARING RESTRICTIONS: No  FALLS:  Has patient fallen in last 6 months? 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  DIAGNOSTIC FINDINGS:  Pt had x rays though PT unable to view them.  MRI: FINDINGS:   Bones: Moderate AC joint arthrosis is present with reactive edema and mild hypertrophy. Mild glenohumeral arthrosis is present. No significant joint effusion. There is an indeterminate bubbly cystic lesion in the posterior lateral humeral head measuring 1.5 cm in maximum size. This could be a benign cyst or possibly chondroid lesion. No aggressive features are present. Correlation with x-ray recommended to evaluate for possible calcific matrix. Otherwise, the bones are unremarkable. There is no fracture or contusion pattern.   Rotator cuff: There is mild insertional tendinosis of the supraspinatus  and infraspinatus tendons. There is no significant partial or full-thickness tear. There is a mild intrasubstance partial tear of the supraspinatus tendon. No full-thickness tear is present. No significant fatty atrophy of the rotator cuff muscles. The subscapularis and teres minor tendons are unremarkable.   Labrum and biceps tendon: Biceps tendon is intact. There is mild blunting of the superior labrum without a well defined superior labral tear. The anterior and posterior labrum are unremarkable.     IMPRESSION: Moderate AC joint arthrosis with reactive edema and mild hypertrophy. Correlation for [facility] joint symptoms.   Mild insertional tendinosis of the supraspinatus tendon with a mild interstitial partial tear. No significant partial or full-thickness rotator cuff tear is present.   Biceps tendon is intact.   Slight blunting of the superior labrum without evidence of a discrete superior labral tear.  PATIENT SURVEYS:  UEFI:  36/80 UEFI:  63/80  (07/11/24)  COGNITION: Overall cognitive status: Within functional limits for tasks assessed      UPPER EXTREMITY ROM:   AROM/PROM Right eval Left eval Right 6/9 Right 7/2 Right 7/10 Right 8/27 Right 12/8  Shoulder flexion 62/83 with pain 154 129  148 150 153  Shoulder scaption 83 with pain 150   133 145 155  Shoulder abduction 69 with pain 112 with pain  109 92 105 142  Shoulder adduction         Shoulder internal rotation       70  Shoulder external rotation 13/18 51 47  38/50 52 58  Elbow flexion         Elbow extension         Wrist flexion         Wrist extension         Wrist ulnar deviation         Wrist radial deviation         Wrist pronation         Wrist supination         (Blank rows = not tested)  UPPER EXTREMITY MMT:  MMT Right eval  Left eval Right 7/10 Right 8/27 Right 12/  Shoulder flexion    4/5 5/5  Shoulder extension       Shoulder abduction       Shoulder adduction       Shoulder  internal rotation     5/5  Shoulder external rotation   5/5 4/5 4/5  Middle trapezius       Lower trapezius       Elbow flexion       Elbow extension       Wrist flexion       Wrist extension       Wrist ulnar deviation       Wrist radial deviation       Wrist pronation       Wrist supination       Grip strength (lbs)       (Blank rows = not tested)  SHOULDER SPECIAL TESTS: Speed's Test:  R: negative, L: negative                                                                                                                             TREATMENT DATE:   07/11/24  Reviewed current function, pain levels, response to prior treatment, and HEP compliance.  PT assessed ROM, strength, and Speed's test.  See above. Pt completed UEFI.  See above. PT educated pt concerning goals, objective findings, progress, and POC.  PT answered pt's questions.   PT performed STM and TPR to bilat UT and R medial scap mm seated.    06/22/24 UBE x 4 mins (2 mins each fwd/bwd) Supine shoulder ABC x 1 rep with 2# Supine serratus punch with 2#  3x10 Supine rhythmic stab's at 60/90/120 deg flexion 2x30 sec each S/L ER 2# 2x10 Standing IR with RTB 3x10 Standing ER with YTB 2x10  PT performed STM and TPR to R UT and R medial scap mm seated.    06/15/24 UBE x 4 mins (2 mins each fwd/bwd) Supine rhythmic stab's at 60/90/120 deg flexion 2x30 sec each S/L ER 2# 2x10 Standing IR with RTB 3x10 Standing ER with YTB 2x10 4D ball rolls with ball on wall 2x10 each ABC with ball on wall x 1 reps  Updated HEP.  Pt received a HEP handout and was educated in correct form and appropriate frequency.   PT performed STM and TPR to R UT and R medial scap mm seated.    06/01/2024 UBE lvl 1 x 5 mins (3' fwd/ 2' bwd) Standing rows with GTB with retraction 2x15 Standing shoulder extension with retraction 2x15 Standing IR with RTB 3x10 Standing ER with YTB 3x10 4D ball rolls with ball on wall 2x10 each Supine  rhythmic stab's at 60/90/120 deg flexion 2x30 sec each  PT performed STM and TPR to R UT and R medial scap mm seated.      PATIENT EDUCATION: Education details:  exercise form, dx, prognosis, HEP, rationale of interventions,  and POC.  Using the theracane  Person educated: Patient Education method: Explanation, Demonstration, Tactile cues, Verbal cues, Education comprehension: verbalized understanding, returned demonstration, verbal cues required, tactile cues required, and needs further education  HOME EXERCISE PROGRAM: Access Code: [email or web address] URL: [email or web address] Date: 12/23/2023 Prepared by: Mose Minerva  Exercises - Seated Scapular Retraction  - 2 x daily - 7 x weekly - 2 sets - 10 reps - 3 seconds hold   - Seated Shoulder Flexion Towel Slide at Table Top  - 1-2 x daily - 7 x weekly - 1-2 sets - 10 reps - Supine Shoulder Flexion AAROM   - 1 x daily - 7 x weekly - 3 sets - 10 reps - Sidelying Shoulder External Rotation  - 1 x daily - 7 x weekly - 2 sets - 10 reps - Isometric Shoulder Flexion at Wall  - 1 x daily - 5 x weekly - 2 sets - 10 reps - 5 seconds hold - Supine Single Arm Shoulder Protraction  - 1 x daily - 7 x weekly - 2 sets - 10 reps - Supine Shoulder Alphabet  - 1 x daily - 7 x weekly - 1 reps - Standing Shoulder Row with Anchored Resistance  - 1 x daily - 4-5 x weekly - 2-3 sets - 10 reps - Shoulder extension with resistance - Neutral  - 4-5 x weekly - 2-3 sets - 10 reps - Sidelying Shoulder External Rotation  - 1 x daily - 3 x weekly - 2 sets - 10 reps - Seated Upper Trapezius Stretch  - 2 x daily - 7 x weekly - 2-3 reps - 20 seconds hold  ASSESSMENT:  CLINICAL IMPRESSION: Pt is making good progress overall.  She is feeling much better and presents to treatment without pain today.  She did have an injection of Toradol  last Thursday due to having a migraine.  Pt reports a 90% improvement overall in pain and sx's.  Pt is driving without increased  pain and reports improved pain with work activities.  Pt is able to reach overhead into a cabinet with minimal difficulty and without significant pain.   Pt continues to have pain with self care activities/ADL's though is better.  Pt is able to perform her normal reaching activities without significant pain, only minimal pain.  Pt still has weakness in R shoulder ER though improved flexion strength.  Pt has 5/5 IR strength.  Pt had a negative Speed's test.  Pt demonstrates clinically significant improvement in self perceived disability with UEFI improving from 36/80 prior to 63/80 currently.  Pt continues to respond well to soft tissue work and is seeing a massage therapist also.  Pt has met all STG's and LTG #4 and partially met LTG's #1,5.  PT educated pt in POC and pt verbalized agreement.     OBJECTIVE IMPAIRMENTS: decreased activity tolerance, decreased ROM, decreased strength, hypomobility, increased muscle spasms, impaired flexibility, impaired UE functional use, and pain.   ACTIVITY LIMITATIONS: carrying, lifting, sleeping, bathing, dressing, reach over head, and hygiene/grooming  PARTICIPATION LIMITATIONS: cleaning, driving, shopping, community activity, and occupation  PERSONAL FACTORS: 1 comorbidity: DM type 2 are also affecting patient's functional outcome.   REHAB POTENTIAL: Good  CLINICAL DECISION MAKING: Stable/uncomplicated  EVALUATION COMPLEXITY: Low   GOALS:   SHORT TERM GOALS: Target date: 01/13/2024   Pt will be independent and compliant with HEP for improved ROM, pain, strength, and function.  Baseline: Goal status: GOAL MET  8/27  2.  Pt will  report at least a 25% improvement in pain and performance of ADL's.   Baseline: 40% Goal status: MET - 02/03/24  3.  Pt will be able to drive without increased pain.  Baseline: painful with driving in reverse Goal status:  GOAL MET  07/11/24 Target date:  01/20/2024  4.  Pt will demo at least a 30 deg increase in flexion,  abd, and scaption AROM and a 25 deg increase in ER AROM for improved reaching and performance of daily activities.  Baseline:  Goal status:  GOAL MET  03/30/24    LONG TERM GOALS: Target date: 08/15/2024  Pt will be able to reach overhead into a cabinet without difficulty and significant pain.  Baseline:  Goal status: 75% Met -07/11/24  2.  Pt will be able to perform her work activities without significant pain.  Baseline:  Goal status: PROGRESSING  3.  Pt will be able to perform self care activities/ADL's without difficulty and significant pain.   Baseline:  Goal status: PROGRESSING  4.  Pt will be able to perform her normal reaching activities without significant pain Baseline:  Goal status: GOAL MET  12/8  5.  Pt will demo R shoulder AROM to be [facility] t/o for performance of ADLs and IADLs.  Baseline:  Goal status: 95% MET    PLAN:  PT FREQUENCY:  2-3 more visits  PT DURATION: 5 weeks  PLANNED INTERVENTIONS: 97164- PT Re-evaluation, 97750- Physical Performance Testing, 97110-Therapeutic exercises, 97530- Therapeutic activity, [id number]- Neuromuscular re-education, 97535- Self Care, [id number]- Manual therapy, [id number]- Aquatic Therapy, [id number]- Electrical stimulation (unattended), [id number]- Electrical stimulation (manual), 97035- Ultrasound, Patient/Family education, Taping, Dry Needling, Joint mobilization, Spinal mobilization, Cryotherapy, and Moist heat  PLAN FOR NEXT SESSION: STM to UT and periscap mm.  Shoulder ROM.  Cont with strengthening per pt tolerance.    Leigh Minerva III PT, DPT 07/12/24 10:16 PM  [facility] [address] [place], [place], [address] Phone: [phone]   Fax:  [phone]

## 2024-07-11 ENCOUNTER — Ambulatory Visit (HOSPITAL_BASED_OUTPATIENT_CLINIC_OR_DEPARTMENT_OTHER): Attending: Sports Medicine | Admitting: Physical Therapy

## 2024-07-11 ENCOUNTER — Encounter (HOSPITAL_BASED_OUTPATIENT_CLINIC_OR_DEPARTMENT_OTHER): Payer: Self-pay | Admitting: Physical Therapy

## 2024-07-11 DIAGNOSIS — M6281 Muscle weakness (generalized): Secondary | ICD-10-CM | POA: Insufficient documentation

## 2024-07-11 DIAGNOSIS — M25611 Stiffness of right shoulder, not elsewhere classified: Secondary | ICD-10-CM | POA: Diagnosis not present

## 2024-07-11 DIAGNOSIS — M25511 Pain in right shoulder: Secondary | ICD-10-CM | POA: Insufficient documentation

## 2024-07-12 ENCOUNTER — Encounter

## 2024-07-13 ENCOUNTER — Other Ambulatory Visit: Payer: Self-pay

## 2024-07-14 ENCOUNTER — Other Ambulatory Visit (HOSPITAL_BASED_OUTPATIENT_CLINIC_OR_DEPARTMENT_OTHER): Payer: Self-pay

## 2024-07-15 ENCOUNTER — Other Ambulatory Visit: Payer: Self-pay

## 2024-07-15 ENCOUNTER — Other Ambulatory Visit (HOSPITAL_BASED_OUTPATIENT_CLINIC_OR_DEPARTMENT_OTHER): Payer: Self-pay

## 2024-07-15 ENCOUNTER — Encounter

## 2024-07-16 ENCOUNTER — Ambulatory Visit (INDEPENDENT_AMBULATORY_CARE_PROVIDER_SITE_OTHER): Admitting: Neurology

## 2024-07-16 DIAGNOSIS — G4733 Obstructive sleep apnea (adult) (pediatric): Secondary | ICD-10-CM

## 2024-07-16 DIAGNOSIS — R0902 Hypoxemia: Secondary | ICD-10-CM

## 2024-07-16 DIAGNOSIS — G4720 Circadian rhythm sleep disorder, unspecified type: Secondary | ICD-10-CM

## 2024-07-16 DIAGNOSIS — R9431 Abnormal electrocardiogram [ECG] [EKG]: Secondary | ICD-10-CM

## 2024-07-18 ENCOUNTER — Ambulatory Visit: Admitting: Family Medicine

## 2024-07-18 ENCOUNTER — Encounter: Payer: Self-pay | Admitting: Family Medicine

## 2024-07-18 VITALS — BP 128/86 | HR 76 | Ht 71.0 in | Wt 268.0 lb

## 2024-07-18 DIAGNOSIS — Z789 Other specified health status: Secondary | ICD-10-CM | POA: Diagnosis not present

## 2024-07-18 DIAGNOSIS — R634 Abnormal weight loss: Secondary | ICD-10-CM | POA: Diagnosis not present

## 2024-07-18 DIAGNOSIS — G4733 Obstructive sleep apnea (adult) (pediatric): Secondary | ICD-10-CM

## 2024-07-18 DIAGNOSIS — R0902 Hypoxemia: Secondary | ICD-10-CM | POA: Diagnosis not present

## 2024-07-18 DIAGNOSIS — G4734 Idiopathic sleep related nonobstructive alveolar hypoventilation: Secondary | ICD-10-CM

## 2024-07-18 DIAGNOSIS — Z8669 Personal history of other diseases of the nervous system and sense organs: Secondary | ICD-10-CM | POA: Diagnosis not present

## 2024-07-18 NOTE — Progress Notes (Signed)
 Chief Complaint  Patient presents with   Follow-up    Follow up, alone, Cpap   Ahern: headaches Athar: OSA on CPAP  HISTORY OF PRESENT ILLNESS:  07/18/2024 ALL:  Aimee Hall returns for follow up for OSA on CPAP and migraines. She was last seen 12/2023 and reported worsening headaches. We started Ajovy  and advised MRI. We performed CPAP titration to assess need for O2 07/16/2024. Results not available at this time.   Since, she reports not using her CPAP. She was having difficulty tolerating therapy due to mask causing itching. She had Covid 04/2024 and has had difficulty with migraines and fatigue since. She is having a hard time losing weight. She was seen by PCP and reports being referred her to GYN for hormone eval. She is having about 4-5 migraine days a month. She did not start Ajovy . She prefers to take less medication if able. Ubrelvy  usually works well. She knows that scents are significant trigger. She has returned to work in office.   12/10/2023 ALL:  Aimee Hall is a 52 y.o. female here today for follow up for OSA on CPAP and migraines. MRI brian ordered by Dr Ines 02/2023 but she has not had this performed. She was last seen 07/2023 and continued to adjust to CPAP therapy. She had continued Ajovy  and Ubrelvy  and felt migraines had improved. She called 09/2023 requesting lower pressure range and Dr Buck adjusted to 4-10cmH20.   Since, she reports migraines are worsening. She reports an episode of syncope with recent migraine. This has happened in the past. She did not seek medical attention. She reports full LOC. No confusion following event. She denies injuries. She reports having to lie down at times and lift her feet in the air due to feeling like she may faint. She had to stop Ajovy  due to insurance not being covered. She continued Ubrelvy  as needed. Usually twice a week. She has had to get a Toradol  injection from PCP 1-2 times recently. She has more trouble with seasonal allergies.  She recently moved back into the office (from working at home) and feels that scents are a trigger.   She continues losartan  for CKD. Reports BP is usually normal but has ran low with at least two previous ER visits. LDL was 91 07/2023, down from 183 01/2023.   She is using CPAP. She continues to adjust. She does have dry mouth but feels new pressure settings are better.     08/04/23 ALL: Aimee Hall is a 53 y.o. female here today for follow up for OSA recently started on CPAP. She was seen in consult with Dr Ines 02/2023 for migraines and referred to Dr Buck for sleep eval. HST 05/2023 showed severe obstructive sleep apnea - by number of events - with a total AHI of 31.8/hour and O2 nadir of 88%. AutoPAP advised. Since, she continues to adjust to therapy. She does not love using CPAP. She reports fighting with the mask. She is a side sleeper. She has a hard time sleeping on her back. She is using dreamwear stule FFM. She feels that air leaks in her eye during the night. She did not have a great experience with DME when being set up.   She continues Ajovy  and Ubrelvy  for migraine management. She reports morning headaches are improved. She has a migraine about 4 times a month. Previously 8-12.     History (copied from Dr Obie previous note)  Dear Aimee Hall,   I saw your  patient, Aimee Hall, upon your kind request and my sleep clinic today for initial consultation of her sleep disorder, in particular, concern for underlying obstructive sleep apnea.  The patient is unaccompanied today.  As you know, Aimee Hall is a 53 year old female with an underlying medical history of asthma, diabetes, reflux disease, history of syncope, migraine headaches, and severe obesity with a BMI of over 40, who reports some snoring and excessive daytime somnolence as well as morning headaches.  She had a recent overnight pulse oximetry test which showed repeated oxygen desaturations at night.  Her Epworth sleepiness  score is 11 out of 24, fatigue severity score is 55 out of 63.  She had an overnight pulse oximetry test on room air on 02/23/2023.  Total duration of test time was 8 hours and 32 minutes, average oxygen saturation 94.9%, nadir was 78% with time below or at 88% saturation of nearly 14 minutes, 13 minutes and 54 seconds.  She wakes up with a headache often.  She has significant seasonal allergies and also history of asthma for which she has a rescue inhaler. I reviewed your office visit note from 02/16/2023.  She is not aware of any family history of sleep apnea.  She lives with her youngest daughter.  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  Bedtime is generally between 10 and 10:30 PM and rise time at 5:45 AM.  She has nocturia about twice per average night.  She drinks limited caffeine, 1 cup of coffee in the morning and is working on eliminating caffeine altogether.  She is a non-smoker and does not drink alcohol.   REVIEW OF SYSTEMS: Out of a complete 14 system review of symptoms, the patient complains only of the following symptoms, headaches, syncope, and all other reviewed systems are negative.   ALLERGIES: Allergies  Allergen Reactions   Hydromorphone  Anaphylaxis   Iodine Anaphylaxis   Shellfish Allergy Anaphylaxis    Other Reaction(s): Unknown   Atorvastatin Other (See Comments)    Muscle cramps   Atorvastatin Calcium     Other Reaction(s): muscle spasms   Coconut Fragrance    Cucumber Fragrance    Gardenia Fragrance    Honey Almond Fragrance    Iodinated Contrast Media Hives   Sertraline     Other Reaction(s): bad dreams     HOME MEDICATIONS: Outpatient Medications Prior to Visit  Medication Sig Dispense Refill   acetaminophen -codeine  (TYLENOL  #2) 300-15 MG tablet Take 1 tablet by mouth  once a day for HA that does not respond to initial treatment As needed 10 tablet 0   albuterol  (PROVENTIL ) (2.5 MG/3ML)  0.083% nebulizer solution Take 3 mLs (2.5 mg total) by nebulization every 4 (four) hours as needed for wheezing or shortness of breath. 150 mL 3   albuterol  (VENTOLIN  HFA) 108 (90 Base) MCG/ACT inhaler Inhale 2 puffs into the lungs every 4 (four) hours as needed for wheezing. 6.7 g 2   Albuterol -Budesonide  (AIRSUPRA ) 90-80 MCG/ACT AERO Inhale 2 Inhalations into the lungs every 4 (four) hours as needed. 10.7 g 2   cetirizine  (ZYRTEC ) 10 MG tablet Take 1 tablet (10 mg total) by mouth daily as needed for allergies or rhinitis. 100 tablet 1   clindamycin  (CLEOCIN  T) 1 % lotion Apply 1 Application topically daily. 60 mL 2   clindamycin  (CLINDAGEL ) 1 % gel Apply 1 Application topically 2 (two) times daily.     Continuous Glucose Receiver (FREESTYLE Holiday  2 READER) DEVI Use as directed 1 each 3   Continuous Glucose Sensor (FREESTYLE LIBRE 2 SENSOR) MISC Apply sensor every 14 days as directed. 2 each 3   diclofenac  (VOLTAREN ) 50 MG EC tablet Take 1 tablet (50 mg total) by mouth 2 (two) times daily with a meal. 40 tablet 0   EPINEPHrine  (EPIPEN  2-PAK) 0.3 mg/0.3 mL IJ SOAJ injection Inject 0.3 mg into the skin as needed. 2 each 1   Evolocumab  (REPATHA  SURECLICK) 140 MG/ML SOAJ Inject 140 mg into the skin every 14 (fourteen) days. 6 mL 3   fluticasone  (FLONASE ) 50 MCG/ACT nasal spray Place 1 spray into both nostrils daily as needed. 16 g 2   losartan  (COZAAR ) 25 MG tablet Take 1 tablet (25 mg total) by mouth daily. 90 tablet 1   Olopatadine  HCl 0.2 % SOLN Instill 1 drop  into each eye once daily as needed. 75 mL 1   Olopatadine -Mometasone (RYALTRIS ) 665-25 MCG/ACT SUSP 2 sprays each nostril 1-2 times per day 29 g 1   ondansetron  (ZOFRAN -ODT) 4 MG disintegrating tablet Place 1 tablet (4 mg total) on the tongue and allow to dissolve every 8 (eight) hours as needed. 20 tablet 1   tirzepatide  (MOUNJARO ) 10 MG/0.5ML Pen Inject 10 mg into the skin once a week. 6 mL 1   traZODone  (DESYREL ) 50 MG tablet Take 1-2  tablets (50-100 mg total) by mouth 1 hour before bedtime daily as needed for sleep. 60 tablet 1   trimethoprim -polymyxin b  (POLYTRIM ) ophthalmic solution Place 1 drop into the affected eye(s) 4 (four) times daily. 10 mL 0   Ubrogepant  (UBRELVY ) 100 MG TABS Take 1 tablet (100 mg total) by mouth at onset of headache. May repeat in 2 hours as needed. Max 2 in 24 hours. 8 tablet 0   Vitamin D , Ergocalciferol , (DRISDOL ) 1.25 MG (50000 UNIT) CAPS capsule Take 1 capsule (50,000 Units total) by mouth once a week. 12 capsule 0   LORazepam  (ATIVAN ) 1 MG tablet Take 1 tablet (1 mg total) by mouth 30 mins prior to MRI. 1 tablet 0   losartan  (COZAAR ) 25 MG tablet Take 25 mg by mouth daily.     Ubrogepant  (UBRELVY ) 100 MG TABS Take 1 tablet (100 mg total) by mouth every 2 (two) hours as needed. Maximum 200mg  a day.     Vitamin D , Ergocalciferol , (DRISDOL ) 1.25 MG (50000 UNIT) CAPS capsule Take 50,000 Units by mouth once a week.     Continuous Glucose Sensor (FREESTYLE LIBRE 14 DAY SENSOR) MISC Apply 1 sensor as directed and repleace every 14 days. 2 each 3   Continuous Glucose Sensor (FREESTYLE LIBRE 14 DAY SENSOR) MISC Apply 1 each topically every 14 (fourteen) days for 3 doses. 2 each 1   Continuous Glucose Sensor (FREESTYLE LIBRE 3 PLUS SENSOR) MISC Apply sensor to upper arm every 15 days. 2 each 3   naproxen  sodium (ANAPROX ) 550 MG tablet Take 1 tablet (550 mg total) by mouth 2 (two) times daily with a meal. 30 tablet 1   NEFFY  2 MG/0.1ML SOLN Place 1 Dose into the nose as directed. For single use only. Use same nostril if another dose is needed. 4 each 4   tirzepatide  (MOUNJARO ) 5 MG/0.5ML Pen Inject 5 mg into the skin once a week. 6 mL 1   tirzepatide  (MOUNJARO ) 5 MG/0.5ML Pen Inject 5 mg into the skin once a week. 6 mL 1   No facility-administered medications prior to visit.     PAST MEDICAL HISTORY:  Past Medical History:  Diagnosis Date   Asthma    Dehydration    Diabetes mellitus without  complication (HCC)    Eczema    GERD (gastroesophageal reflux disease)    Syncopal episodes    Urticaria      PAST SURGICAL HISTORY: Past Surgical History:  Procedure Laterality Date   NO PAST SURGERIES       FAMILY HISTORY: Family History  Problem Relation Age of Onset   Eczema Mother    Angioedema Mother    Allergic rhinitis Mother    Eczema Father    Allergic rhinitis Father    Allergic rhinitis Sister    Asthma Brother    Eczema Brother    Allergic rhinitis Brother    Angioedema Maternal Aunt    Allergic rhinitis Maternal Aunt    Angioedema Maternal Uncle    Allergic rhinitis Maternal Uncle    Allergic rhinitis Paternal Aunt    Allergic rhinitis Paternal Uncle    Angioedema Maternal Grandmother    Allergic rhinitis Maternal Grandmother    Angioedema Maternal Grandfather    Allergic rhinitis Maternal Grandfather    Allergic rhinitis Paternal Grandmother    Allergic rhinitis Paternal Grandfather    Syncope episode Other    Headache Neg Hx    Sleep apnea Neg Hx      SOCIAL HISTORY: Social History   Socioeconomic History   Marital status: Divorced    Spouse name: Not on file   Number of children: Not on file   Years of education: Not on file   Highest education level: Not on file  Occupational History   Not on file  Tobacco Use   Smoking status: Never   Smokeless tobacco: Never  Substance and Sexual Activity   Alcohol use: No   Drug use: No   Sexual activity: Not Currently  Other Topics Concern   Not on file  Social History Narrative   Pt lives family    Pt works    Social Drivers of Health   Tobacco Use: Low Risk (07/18/2024)   Patient History    Smoking Tobacco Use: Never    Smokeless Tobacco Use: Never    Passive Exposure: Not on file  Financial Resource Strain: Not on file  Food Insecurity: Not on file  Transportation Needs: Not on file  Physical Activity: Not on file  Stress: Not on file  Social Connections: Unknown (12/13/2021)    [facility]   Social Network    Social Network: Not on file  Intimate Partner Violence: Unknown (11/04/2021)   Received from [facility]   HITS    Physically Hurt: Not on file    Insult or Talk Down To: Not on file    Threaten Physical Harm: Not on file    Scream or Curse: Not on file  Depression (PHQ2-9): Not on file  Alcohol Screen: Not on file  Housing: Not on file  Utilities: Not on file  Health Literacy: Not on file     PHYSICAL EXAM  Vitals:   07/18/24 1423  BP: 128/86  Pulse: 76  SpO2: 99%  Weight: 268 lb (121.6 kg)  Height: 5' 11 (1.803 m)    Body mass index is 37.38 kg/m.  Generalized: Well developed, in no acute distress  Cardiology: normal rate and rhythm, no murmur auscultated  Respiratory: clear to auscultation bilaterally    Neurological examination  Mentation: Alert oriented to time, place, history taking. Follows all commands speech and language fluent  Cranial nerve II-XII: Pupils were equal round reactive to light. Extraocular movements were full, visual field were full on confrontational test. Facial sensation and strength were normal. Uvula tongue midline. Head turning and shoulder shrug  were normal and symmetric. Motor: The motor testing reveals 5 over 5 strength of all 4 extremities. Good symmetric motor tone is noted throughout.  Sensory: Sensory testing is intact to soft touch on all 4 extremities. No evidence of extinction is noted.  Gait and station: Gait is normal.    DIAGNOSTIC DATA (LABS, IMAGING, TESTING) - I reviewed patient records, labs, notes, testing and imaging myself where available.  Lab Results  Component Value Date   WBC 9.7 12/19/2016   HGB 12.6 12/19/2016   HCT 36.7 12/19/2016   MCV 87.4 12/19/2016   PLT 270 12/19/2016      Component Value Date/Time   NA 136 12/19/2016 0025   K 3.4 (L) 12/19/2016 0025   CL 103 12/19/2016 0025   CO2 22 12/19/2016 0025   GLUCOSE 143 (H) 12/19/2016 0025   BUN 19  12/19/2016 0025   CREATININE 0.97 12/19/2016 0025   CALCIUM 8.0 (L) 12/19/2016 0025   PROT 7.0 12/19/2016 0025   ALBUMIN 3.6 12/19/2016 0025   AST 13 (L) 12/19/2016 0025   ALT 15 12/19/2016 0025   ALKPHOS 57 12/19/2016 0025   BILITOT 0.8 12/19/2016 0025   GFRNONAA >60 12/19/2016 0025   GFRAA >60 12/19/2016 0025   No results found for: CHOL, HDL, LDLCALC, LDLDIRECT, TRIG, CHOLHDL No results found for: YHAJ8R No results found for: VITAMINB12 Lab Results  Component Value Date   TSH 1.990 02/16/2023        No data to display               No data to display           ASSESSMENT AND PLAN  53 y.o. year old female  has a past medical history of Asthma, Dehydration, Diabetes mellitus without complication (HCC), Eczema, GERD (gastroesophageal reflux disease), Syncopal episodes, and Urticaria. here with    OSA on CPAP  Hypoxia  Difficulty with CPAP use  Oxygen desaturation during sleep  History of obstructive sleep apnea  Weight loss  Aimee Hall reports worsening migraines. We have discussed adding prevention medication but she declines at this time. She will continue Ubrelvy  for abortive therapy. She has discontinued CPAP based on previous sleep results as she was not tolerating therapy. We are awaiting titration study results. Will update treatment plan following receipt of results. Healthy lifestyle habits encouraged. She will follow up with PCP as directed. She will return to see me in 1 year, sooner if needed. She verbalizes understanding and agreement with this plan.   No orders of the defined types were placed in this encounter.    No orders of the defined types were placed in this encounter.    Greig Forbes, MSN, FNP-C 07/18/2024, 4:05 PM  [facility] [address], [address] [place], [place] [address] [phone]

## 2024-07-18 NOTE — Patient Instructions (Signed)
 Below is our plan:  We will continue Ubrelvy  as needed. We can consider adding prevention medication if needed.   We will update treatment plan based on titration study results when available.   Please make sure you are staying well hydrated. I recommend 50-60 ounces daily. Well balanced diet and regular exercise encouraged. Consistent sleep schedule with 6-8 hours recommended.   Please continue follow up with care team as directed.   Follow up with me in 1 year  You may receive a survey regarding today's visit. I encourage you to leave honest feed back as I do use this information to improve patient care. Thank you for seeing me today!

## 2024-07-20 ENCOUNTER — Other Ambulatory Visit (HOSPITAL_BASED_OUTPATIENT_CLINIC_OR_DEPARTMENT_OTHER): Payer: Self-pay

## 2024-07-20 ENCOUNTER — Encounter (HOSPITAL_BASED_OUTPATIENT_CLINIC_OR_DEPARTMENT_OTHER): Admitting: Physical Therapy

## 2024-07-20 NOTE — Procedures (Signed)
 Physician Interpretation: Please see link under Procedure Tab or under Encounters tab for physician report, technical report, as well as O2 titration and/or PAP titration tables (if applicable).

## 2024-07-24 ENCOUNTER — Other Ambulatory Visit (HOSPITAL_BASED_OUTPATIENT_CLINIC_OR_DEPARTMENT_OTHER): Payer: Self-pay

## 2024-07-25 ENCOUNTER — Other Ambulatory Visit (HOSPITAL_BASED_OUTPATIENT_CLINIC_OR_DEPARTMENT_OTHER): Payer: Self-pay

## 2024-07-25 ENCOUNTER — Encounter (HOSPITAL_BASED_OUTPATIENT_CLINIC_OR_DEPARTMENT_OTHER): Admitting: Physical Therapy

## 2024-07-25 MED ORDER — UBRELVY 100 MG PO TABS
100.0000 mg | ORAL_TABLET | Freq: Once | ORAL | 0 refills | Status: AC | PRN
  Filled 2024-07-25: qty 1, 1d supply, fill #0

## 2024-07-27 ENCOUNTER — Other Ambulatory Visit (HOSPITAL_BASED_OUTPATIENT_CLINIC_OR_DEPARTMENT_OTHER): Payer: Self-pay

## 2024-07-29 ENCOUNTER — Encounter (HOSPITAL_BASED_OUTPATIENT_CLINIC_OR_DEPARTMENT_OTHER): Payer: Self-pay

## 2024-07-29 ENCOUNTER — Other Ambulatory Visit (HOSPITAL_BASED_OUTPATIENT_CLINIC_OR_DEPARTMENT_OTHER): Payer: Self-pay

## 2024-08-01 ENCOUNTER — Encounter (HOSPITAL_BASED_OUTPATIENT_CLINIC_OR_DEPARTMENT_OTHER): Admitting: Physical Therapy

## 2024-08-01 ENCOUNTER — Encounter: Payer: Self-pay | Admitting: Neurology

## 2024-08-01 DIAGNOSIS — E7849 Other hyperlipidemia: Secondary | ICD-10-CM | POA: Diagnosis not present

## 2024-08-02 ENCOUNTER — Ambulatory Visit: Payer: Self-pay | Admitting: Family Medicine

## 2024-08-02 LAB — NMR, LIPOPROFILE
Cholesterol, Total: 185 mg/dL (ref 100–199)
HDL Particle Number: 29.4 umol/L — ABNORMAL LOW
HDL-C: 43 mg/dL
LDL Particle Number: 1204 nmol/L — ABNORMAL HIGH
LDL Size: 21 nm
LDL-C (NIH Calc): 120 mg/dL — ABNORMAL HIGH (ref 0–99)
LP-IR Score: 27
Small LDL Particle Number: 514 nmol/L
Triglycerides: 124 mg/dL (ref 0–149)

## 2024-08-03 ENCOUNTER — Ambulatory Visit: Payer: Self-pay | Admitting: Internal Medicine

## 2024-08-03 ENCOUNTER — Other Ambulatory Visit: Payer: Self-pay

## 2024-08-10 ENCOUNTER — Ambulatory Visit (HOSPITAL_BASED_OUTPATIENT_CLINIC_OR_DEPARTMENT_OTHER): Attending: Sports Medicine | Admitting: Physical Therapy

## 2024-08-10 DIAGNOSIS — M25511 Pain in right shoulder: Secondary | ICD-10-CM | POA: Diagnosis present

## 2024-08-10 DIAGNOSIS — M25611 Stiffness of right shoulder, not elsewhere classified: Secondary | ICD-10-CM | POA: Insufficient documentation

## 2024-08-10 DIAGNOSIS — M6281 Muscle weakness (generalized): Secondary | ICD-10-CM | POA: Insufficient documentation

## 2024-08-10 NOTE — Therapy (Signed)
 " OUTPATIENT PHYSICAL THERAPY SHOULDER TREATMENT     Patient Name: Aimee Hall MRN: [id number] DOB:1970/09/12, 54 y.o., female Today's Date: 08/11/2024  END OF SESSION:  PT End of Session - 08/10/24 1636     Visit Number 21    Number of Visits 22    Date for Recertification  08/15/24    Authorization Type BCBS    PT Start Time 1634    PT Stop Time 1719    PT Time Calculation (min) 45 min    Activity Tolerance Patient tolerated treatment well    Behavior During Therapy WFL for tasks assessed/performed                    Past Medical History:  Diagnosis Date   Asthma    Dehydration    Diabetes mellitus without complication (HCC)    Eczema    GERD (gastroesophageal reflux disease)    Syncopal episodes    Urticaria    Past Surgical History:  Procedure Laterality Date   NO PAST SURGERIES     Patient Active Problem List   Diagnosis Date Noted   Chronic migraine without aura, with intractable migraine, so stated, with status migrainosus 02/16/2023    PCP: Alvera Reagin, PA  REFERRING PROVIDER: Dasie Fitch, MD  REFERRING DIAG: M75.21 (ICD-10-CM) - Bicipital tendinitis, right shoulder  THERAPY DIAG:  Right shoulder pain, unspecified chronicity  Muscle weakness (generalized)  Stiffness of right shoulder, not elsewhere classified  Rationale for Evaluation and Treatment: Rehabilitation  ONSET DATE: Nov 2024  SUBJECTIVE:                                                                                                                                                                                      SUBJECTIVE STATEMENT: Pt states she hasn't worked since before Christmas and has not been having any shoulder pain.  Pt hasn't been using Tylenol  or any other meds.   Hand dominance: Right  PERTINENT HISTORY: 10# lifting restriction per sports med MD Bilat hip pain DM type 2, obesity, migraine/HA's, anxiety  PAIN:  NPRS:  0/10 current, 3/10 worst  pain  Location:  anterior shoulder.  Type:  dull at rest, sharp with movement.  constant  PRECAUTIONS: None    WEIGHT BEARING RESTRICTIONS: No  FALLS:  Has patient fallen in last 6 months? No  LIVING ENVIRONMENT: Lives with: daughter lives with her Lives in: 1 story home Stairs: 1 step without rail to enter home   OCCUPATION: Pt works at [employer].  Computer work.  Also teaches  PLOF: Independent  PATIENT GOALS:  pain to stop, to be able to workout  OBJECTIVE:  Note: Objective measures were completed at Evaluation unless otherwise noted.  DIAGNOSTIC FINDINGS:  Pt had x rays though PT unable to view them.  MRI: FINDINGS:   Bones: Moderate AC joint arthrosis is present with reactive edema and mild hypertrophy. Mild glenohumeral arthrosis is present. No significant joint effusion. There is an indeterminate bubbly cystic lesion in the posterior lateral humeral head measuring 1.5 cm in maximum size. This could be a benign cyst or possibly chondroid lesion. No aggressive features are present. Correlation with x-ray recommended to evaluate for possible calcific matrix. Otherwise, the bones are unremarkable. There is no fracture or contusion pattern.   Rotator cuff: There is mild insertional tendinosis of the supraspinatus and infraspinatus tendons. There is no significant partial or full-thickness tear. There is a mild intrasubstance partial tear of the supraspinatus tendon. No full-thickness tear is present. No significant fatty atrophy of the rotator cuff muscles. The subscapularis and teres minor tendons are unremarkable.   Labrum and biceps tendon: Biceps tendon is intact. There is mild blunting of the superior labrum without a well defined superior labral tear. The anterior and posterior labrum are unremarkable.     IMPRESSION: Moderate AC joint arthrosis with reactive edema and mild hypertrophy. Correlation for [facility] joint symptoms.   Mild insertional tendinosis of  the supraspinatus tendon with a mild interstitial partial tear. No significant partial or full-thickness rotator cuff tear is present.   Biceps tendon is intact.   Slight blunting of the superior labrum without evidence of a discrete superior labral tear.  PATIENT SURVEYS:  UEFI:  36/80 UEFI:  63/80  (07/11/24)  COGNITION: Overall cognitive status: Within functional limits for tasks assessed      UPPER EXTREMITY ROM:   AROM/PROM Right eval Left eval Right 6/9 Right 7/2 Right 7/10 Right 8/27 Right 12/8  Shoulder flexion 62/83 with pain 154 129  148 150 153  Shoulder scaption 83 with pain 150   133 145 155  Shoulder abduction 69 with pain 112 with pain  109 92 105 142  Shoulder adduction         Shoulder internal rotation       70  Shoulder external rotation 13/18 51 47  38/50 52 58  Elbow flexion         Elbow extension         Wrist flexion         Wrist extension         Wrist ulnar deviation         Wrist radial deviation         Wrist pronation         Wrist supination         (Blank rows = not tested)  UPPER EXTREMITY MMT:  MMT Right eval Left eval Right 7/10 Right 8/27 Right 12/  Shoulder flexion    4/5 5/5  Shoulder extension       Shoulder abduction       Shoulder adduction       Shoulder internal rotation     5/5  Shoulder external rotation   5/5 4/5 4/5  Middle trapezius       Lower trapezius       Elbow flexion       Elbow extension       Wrist flexion       Wrist extension       Wrist ulnar deviation       Wrist radial deviation  Wrist pronation       Wrist supination       Grip strength (lbs)       (Blank rows = not tested)  SHOULDER SPECIAL TESTS: Speed's Test:  R: negative, L: negative                                                                                                                             TREATMENT DATE:   08/10/24 UBE x 4 mins (fwd/bwd alt each min) Standing shoulder ABC x 1 rep Standing rows with GTB  2x10 Standing IR with RTB 3x10 Standing ER with YTB 2x10 4D ball rolls on wall 2x10  PT performed STM and TPR to bilat UT and R medial scap mm seated.   Reviewed HEP and updated HEP.  Pt received a HEP handout and was educated in correct form and appropriate frequency.      07/11/24  Reviewed current function, pain levels, response to prior treatment, and HEP compliance.  PT assessed ROM, strength, and Speed's test.  See above. Pt completed UEFI.  See above. PT educated pt concerning goals, objective findings, progress, and POC.  PT answered pt's questions.   PT performed STM and TPR to bilat UT and R medial scap mm seated.    06/22/24 UBE x 4 mins (2 mins each fwd/bwd) Supine shoulder ABC x 1 rep with 2# Supine serratus punch with 2#  3x10 Supine rhythmic stab's at 60/90/120 deg flexion 2x30 sec each S/L ER 2# 2x10 Standing IR with RTB 3x10 Standing ER with YTB 2x10  PT performed STM and TPR to R UT and R medial scap mm seated.    06/15/24 UBE x 4 mins (2 mins each fwd/bwd) Supine rhythmic stab's at 60/90/120 deg flexion 2x30 sec each S/L ER 2# 2x10 Standing IR with RTB 3x10 Standing ER with YTB 2x10 4D ball rolls with ball on wall 2x10 each ABC with ball on wall x 1 reps  Updated HEP.  Pt received a HEP handout and was educated in correct form and appropriate frequency.   PT performed STM and TPR to R UT and R medial scap mm seated.    06/01/2024 UBE lvl 1 x 5 mins (3' fwd/ 2' bwd) Standing rows with GTB with retraction 2x15 Standing shoulder extension with retraction 2x15 Standing IR with RTB 3x10 Standing ER with YTB 3x10 4D ball rolls with ball on wall 2x10 each Supine rhythmic stab's at 60/90/120 deg flexion 2x30 sec each  PT performed STM and TPR to R UT and R medial scap mm seated.      PATIENT EDUCATION: Education details:  exercise form, dx, prognosis, HEP, rationale of interventions, and POC.  Using the theracane  Person educated:  Patient Education method: Explanation, Demonstration, Tactile cues, Verbal cues, Education comprehension: verbalized understanding, returned demonstration, verbal cues required, tactile cues required, and needs further education  HOME EXERCISE PROGRAM: Access Code: [email or web address] URL: [email or web address] Date: 12/23/2023 Prepared by:  Mose Minerva  Exercises - Seated Scapular Retraction  - 2 x daily - 7 x weekly - 2 sets - 10 reps - 3 seconds hold   - Seated Shoulder Flexion Towel Slide at Table Top  - 1-2 x daily - 7 x weekly - 1-2 sets - 10 reps - Supine Shoulder Flexion AAROM   - 1 x daily - 7 x weekly - 3 sets - 10 reps - Sidelying Shoulder External Rotation  - 1 x daily - 7 x weekly - 2 sets - 10 reps - Isometric Shoulder Flexion at Wall  - 1 x daily - 5 x weekly - 2 sets - 10 reps - 5 seconds hold - Supine Single Arm Shoulder Protraction  - 1 x daily - 7 x weekly - 2 sets - 10 reps - Supine Shoulder Alphabet  - 1 x daily - 7 x weekly - 1 reps - Standing Shoulder Row with Anchored Resistance  - 1 x daily - 4-5 x weekly - 2-3 sets - 10 reps - Shoulder extension with resistance - Neutral  - 4-5 x weekly - 2-3 sets - 10 reps - Sidelying Shoulder External Rotation  - 1 x daily - 3 x weekly - 2 sets - 10 reps - Seated Upper Trapezius Stretch  - 2 x daily - 7 x weekly - 2-3 reps - 20 seconds hold  Updated HEP: - Shoulder Internal Rotation with Resistance  - 1 x daily - 3 x weekly - 2-3 sets - 10 reps - Standing Wall [employer] with Mini Swiss Ball  - 3-4 x weekly - 1-2 sets - 10 reps  ASSESSMENT:  CLINICAL IMPRESSION: Pt is feeling much better.  She denies pain.  Pt hasn't been at work due to having time off for the holidays.  Pt hasn't been using Tylenol  or any other meds.  PT extensively went through HEP and updated HEP.  PT gave pt a HEP handout and educated pt in correct form and appropriate frequency.  Pt performed exercises well.  She could feel it in her neck when  performing standing ER with YTB.  PT did not include in HEP though instructed her in continuing to perform S/L ER at home and educated pt in appropriate sets/reps and frequency.  Pt has much reduced tightness in UT and medial scap mm.  She still had a trigger point in UT and medial scap mm though less than prior.  She responded well to treatment having no c/o's and no pain after treatment.  PT educated pt concerning POC and pt verbalized agreement.      OBJECTIVE IMPAIRMENTS: decreased activity tolerance, decreased ROM, decreased strength, hypomobility, increased muscle spasms, impaired flexibility, impaired UE functional use, and pain.   ACTIVITY LIMITATIONS: carrying, lifting, sleeping, bathing, dressing, reach over head, and hygiene/grooming  PARTICIPATION LIMITATIONS: cleaning, driving, shopping, community activity, and occupation  PERSONAL FACTORS: 1 comorbidity: DM type 2 are also affecting patient's functional outcome.   REHAB POTENTIAL: Good  CLINICAL DECISION MAKING: Stable/uncomplicated  EVALUATION COMPLEXITY: Low   GOALS:   SHORT TERM GOALS: Target date: 01/13/2024   Pt will be independent and compliant with HEP for improved ROM, pain, strength, and function.  Baseline: Goal status: GOAL MET  8/27  2.  Pt will report at least a 25% improvement in pain and performance of ADL's.   Baseline: 40% Goal status: MET - 02/03/24  3.  Pt will be able to drive without increased pain.  Baseline: painful with driving  in reverse Goal status:  GOAL MET  07/11/24 Target date:  01/20/2024  4.  Pt will demo at least a 30 deg increase in flexion, abd, and scaption AROM and a 25 deg increase in ER AROM for improved reaching and performance of daily activities.  Baseline:  Goal status:  GOAL MET  03/30/24    LONG TERM GOALS: Target date: 08/15/2024  Pt will be able to reach overhead into a cabinet without difficulty and significant pain.  Baseline:  Goal status: 75% Met -07/11/24  2.   Pt will be able to perform her work activities without significant pain.  Baseline:  Goal status: PROGRESSING  3.  Pt will be able to perform self care activities/ADL's without difficulty and significant pain.   Baseline:  Goal status: PROGRESSING  4.  Pt will be able to perform her normal reaching activities without significant pain Baseline:  Goal status: GOAL MET  12/8  5.  Pt will demo R shoulder AROM to be [facility] t/o for performance of ADLs and IADLs.  Baseline:  Goal status: 95% MET    PLAN:  PT FREQUENCY:  2-3 more visits  PT DURATION: 5 weeks  PLANNED INTERVENTIONS: 02835- PT Re-evaluation, 97750- Physical Performance Testing, 97110-Therapeutic exercises, 97530- Therapeutic activity, [id number]- Neuromuscular re-education, 97535- Self Care, 02859- Manual therapy, [id number]- Aquatic Therapy, H9716- Electrical stimulation (unattended), [id number]- Electrical stimulation (manual), 97035- Ultrasound, Patient/Family education, Taping, Dry Needling, Joint mobilization, Spinal mobilization, Cryotherapy, and Moist heat  PLAN FOR NEXT SESSION:  Pt is going to see how she feels with her updated HEP.  She may call to cancel her last PT appt if she is doing well.  If she doesn't call to cancel, pt to be seen for 1 more visit.      Leigh Minerva III PT, DPT 08/11/2024 11:41 PM   [facility] [address] [place], [place], [address] Phone: [phone]   Fax:  [phone]  "

## 2024-08-11 ENCOUNTER — Encounter (HOSPITAL_BASED_OUTPATIENT_CLINIC_OR_DEPARTMENT_OTHER): Payer: Self-pay | Admitting: Physical Therapy

## 2024-08-12 ENCOUNTER — Encounter: Payer: Self-pay | Admitting: Internal Medicine

## 2024-08-12 ENCOUNTER — Ambulatory Visit: Attending: Internal Medicine | Admitting: Internal Medicine

## 2024-08-12 VITALS — BP 105/70 | HR 79 | Resp 16 | Ht 71.0 in | Wt 266.8 lb

## 2024-08-12 DIAGNOSIS — M791 Myalgia, unspecified site: Secondary | ICD-10-CM | POA: Diagnosis not present

## 2024-08-12 DIAGNOSIS — E7849 Other hyperlipidemia: Secondary | ICD-10-CM

## 2024-08-12 DIAGNOSIS — T466X5D Adverse effect of antihyperlipidemic and antiarteriosclerotic drugs, subsequent encounter: Secondary | ICD-10-CM

## 2024-08-12 DIAGNOSIS — Z87898 Personal history of other specified conditions: Secondary | ICD-10-CM

## 2024-08-12 DIAGNOSIS — E669 Obesity, unspecified: Secondary | ICD-10-CM | POA: Diagnosis not present

## 2024-08-12 NOTE — Progress Notes (Signed)
 "  LIPID CLINIC CONSULT NOTE  Chief Complaint:  Follow-up dyslipidemia  Primary Care Physician: Redmon, Noelle, PA  Primary Cardiologist:  None  HPI:  Aimee Hall is a 54 y.o. female who is being seen today for the evaluation of dyslipidemia at the request of Redmon, [place], [place].  This is a pleasant 54 year old female who is kindly referred for evaluation and management of dyslipidemia.  Past medical history is significant for diabetes, reflux, multiple episodes of what sounds like vasovagal syncope and dehydration as well as asthma.  She also has a history of elevated cholesterol but has been hesitant to take statins due to a concerns about vitamin deficiencies associated with that.  Most recently her lipids show total cholesterol 257, triglycerides 141, HDL 34 and LDL of 197.  Hemoglobin A1c was 6.9.  Her PCP had recommended rosuvastatin however she had not had that filled and previously did try atorvastatin but did have side effects associated with it which were muscle spasms across her left upper chest.  She says that recently she has started up on a low carbohydrate diet but not a ketogenic diet and is losing some weight with this.  She is preferring to try to manage her lipids with dietary modification if possible.  03/19/2021  Aimee Hall returns today for follow-up.  She underwent calcium scoring which is reassuring and 0.  However given her younger age she may not yet have calcified her coronaries.  She does have diabetes on metformin  however A1c is 6.0.  She is working on weight loss.  Her lipids continue to be elevated.  Total cholesterol 229, triglycerides of 70, HDL 40 and LDL 193.  Her LDL particle numbers remain high at 2430.  All LDL P is also high at 1127.  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  08/07/2021  Aimee Hall returns today for follow-up.  She has had a very good response to her Repatha  with a reduction in LDL P  from 2004 and 30-1003 and 27, LDL-C is come down to 116 from 193.  Her small LDL P is come out from 1127-660 and total cholesterol down to 175 from 245.  This is somewhat of an incomplete response as expected typically more than 60% reduction.  It may be partially due to an elevated LP(a) which is borderline but still in normal range at 73.8 nmol/L.  She has lost a little weight recently but have been up to about 340 pounds.  She is trying to get back down to around 270 pounds.  She reports tolerating the Repatha  well without any significant side effects.  01/20/2023  Aimee Hall is seen today in follow-up.  She tells me she recently was in [place], [place] and had a significant weight loss and felt that she was doing much better eating the foods there as far as her glycemic control.  I suspect she was also much more active.  She had been doing well on Repatha  but discontinued it along with all of her other medications due to some concerns about potential side effects that she was reading online.  Not surprisingly, her cholesterol rebounded to her pretreatment levels with recent lipids showing LDL particle #2004 and 74, LDL-C of 183, HDL-C of 36 and triglycerides 89.  When she noted these lipid changes she says she has now gone back to therapy with the Repatha .  07/14/2023  Aimee Hall is seen today in follow-up.  She has had significant improvement  in her cholesterol again with LDL particle number of 966, down from 2004 and 74.  Her LDL is now 91 again down from 183.  Overall a very good response to therapy.  She is on Repatha .  08/12/2024  Aimee Hall is seen today in follow-up.  Overall she feels well except that she had COVID-19 in the fall and noted that she had a plateau in her weight loss at that time.  She has been on Mounjaro  and has had increased the dose recently after significant weight loss.  Despite this her cholesterol has gone up somewhat.  LDL is now 120 up from 91 and particle numbers of  increased with minimal increase in triglycerides from 53-1 24.  She tells me that this study was nonfasting.  She continues on Repatha  and is tolerating it well.   PMHx:  Past Medical History:  Diagnosis Date   Asthma    Dehydration    Diabetes mellitus without complication (HCC)    Eczema    GERD (gastroesophageal reflux disease)    Syncopal episodes    Urticaria     Past Surgical History:  Procedure Laterality Date   NO PAST SURGERIES      FAMHx:  Family History  Problem Relation Age of Onset   Eczema Mother    Angioedema Mother    Allergic rhinitis Mother    Eczema Father    Allergic rhinitis Father    Allergic rhinitis Sister    Asthma Brother    Eczema Brother    Allergic rhinitis Brother    Angioedema Maternal Aunt    Allergic rhinitis Maternal Aunt    Angioedema Maternal Uncle    Allergic rhinitis Maternal Uncle    Allergic rhinitis Paternal Aunt    Allergic rhinitis Paternal Uncle    Angioedema Maternal Grandmother    Allergic rhinitis Maternal Grandmother    Angioedema Maternal Grandfather    Allergic rhinitis Maternal Grandfather    Allergic rhinitis Paternal Grandmother    Allergic rhinitis Paternal Grandfather    Syncope episode Other    Headache Neg Hx    Sleep apnea Neg Hx     SOCHx:   reports that she has never smoked. She has never used smokeless tobacco. She reports that she does not drink alcohol and does not use drugs.  ALLERGIES:  Allergies  Allergen Reactions   Hydromorphone  Anaphylaxis   Iodine Anaphylaxis   Shellfish Allergy Anaphylaxis    Other Reaction(s): Unknown   Atorvastatin Other (See Comments)    Muscle cramps   Atorvastatin Calcium     Other Reaction(s): muscle spasms   Coconut Fragrance    Cucumber Fragrance    Gardenia Fragrance    Honey Almond Fragrance    Iodinated Contrast Media Hives   Sertraline     Other Reaction(s): bad dreams    ROS: Pertinent items noted in HPI and remainder of comprehensive ROS  otherwise negative.  HOME MEDS: Current Outpatient Medications on File Prior to Visit  Medication Sig Dispense Refill   acetaminophen -codeine  (TYLENOL  #2) 300-15 MG tablet Take 1 tablet by mouth  once a day for HA that does not respond to initial treatment As needed 10 tablet 0   albuterol  (PROVENTIL ) (2.5 MG/3ML) 0.083% nebulizer solution Take 3 mLs (2.5 mg total) by nebulization every 4 (four) hours as needed for wheezing or shortness of breath. 150 mL 3   albuterol  (VENTOLIN  HFA) 108 (90 Base) MCG/ACT inhaler Inhale 2 puffs into the lungs every 4 (four) hours  as needed for wheezing. 6.7 g 2   Albuterol -Budesonide  (AIRSUPRA ) 90-80 MCG/ACT AERO Inhale 2 Inhalations into the lungs every 4 (four) hours as needed. 10.7 g 2   cetirizine  (ZYRTEC ) 10 MG tablet Take 1 tablet (10 mg total) by mouth daily as needed for allergies or rhinitis. 100 tablet 1   clindamycin  (CLEOCIN  T) 1 % lotion Apply 1 Application topically daily. 60 mL 2   clindamycin  (CLINDAGEL ) 1 % gel Apply 1 Application topically 2 (two) times daily.     Continuous Glucose Sensor (FREESTYLE LIBRE 2 SENSOR) MISC Apply sensor every 14 days as directed. 2 each 3   EPINEPHrine  (EPIPEN  2-PAK) 0.3 mg/0.3 mL IJ SOAJ injection Inject 0.3 mg into the skin as needed. 2 each 1   Evolocumab  (REPATHA  SURECLICK) 140 MG/ML SOAJ Inject 140 mg into the skin every 14 (fourteen) days. 6 mL 3   fluticasone  (FLONASE ) 50 MCG/ACT nasal spray Place 1 spray into both nostrils daily as needed. 16 g 2   losartan  (COZAAR ) 25 MG tablet Take 1 tablet (25 mg total) by mouth daily. 90 tablet 1   Olopatadine  HCl 0.2 % SOLN Instill 1 drop  into each eye once daily as needed. 75 mL 1   Olopatadine -Mometasone  (RYALTRIS ) 665-25 MCG/ACT SUSP 2 sprays each nostril 1-2 times per day 29 g 1   ondansetron  (ZOFRAN -ODT) 4 MG disintegrating tablet Place 1 tablet (4 mg total) on the tongue and allow to dissolve every 8 (eight) hours as needed. 20 tablet 1   tirzepatide  (MOUNJARO )  10 MG/0.5ML Pen Inject 10 mg into the skin once a week. 6 mL 1   traZODone  (DESYREL ) 50 MG tablet Take 1-2 tablets (50-100 mg total) by mouth 1 hour before bedtime daily as needed for sleep. 60 tablet 1   Ubrogepant  (UBRELVY ) 100 MG TABS Take 1 tablet (100 mg total) by mouth at onset of headache. 1 tablet 0   Vitamin D , Ergocalciferol , (DRISDOL ) 1.25 MG (50000 UNIT) CAPS capsule Take 1 capsule (50,000 Units total) by mouth once a week. 12 capsule 0   No current facility-administered medications on file prior to visit.    LABS/IMAGING: No results found for this or any previous visit (from the past 48 hours). No results found.  LIPID PANEL: No results found for: CHOL, TRIG, HDL, CHOLHDL, VLDL, LDLCALC, LDLDIRECT  WEIGHTS: Wt Readings from Last 3 Encounters:  08/12/24 266 lb 12.8 oz (121 kg)  07/18/24 268 lb (121.6 kg)  01/12/24 290 lb (131.5 kg)    VITALS: BP 105/70 (BP Location: Left Arm, Patient Position: Sitting, Cuff Size: Large)   Pulse 79   Resp 16   Ht 5' 11 (1.803 m)   Wt 266 lb 12.8 oz (121 kg)   LMP 10/17/2023 (Approximate)   SpO2 99%   BMI 37.21 kg/m   EXAM: Deferred  EKG: EKG Interpretation Date/Time:  Friday August 12 2024 08:12:00 EST Ventricular Rate:  77 PR Interval:  150 QRS Duration:  74 QT Interval:  378 QTC Calculation: 427 R Axis:   -27  Text Interpretation: Normal sinus rhythm Normal ECG When compared with ECG of 19-Dec-2016 00:22, No significant change since last tracing Confirmed by Mona Kent [phone]) on 08/12/2024 8:18:34 AM    ASSESSMENT: 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 Atorvastatin intolerant History of vasovagal syncope Type 2 diabetes Morbid obesity with weight loss efforts in  place 0 CAC score (12/2020)  PLAN: 1.   Mrs. Dragovich seems to be doing well from a cardiac  standpoint.  She denies chest pain or shortness of breath.  Her cholesterol is trending up somewhat although she has had significant weight loss.  Unfortunately she seems to have plateaued which is frustrating to her.  She is interested in pursuing possibly functional medicine after studying functional peptides.  I have encouraged her continued weight loss and exercise.  Will plan to repeat an MRI in about 3 months.  If her cholesterol continues to rise we might consider adding ezetimibe.  Follow-up with me or APP annually or sooner as necessary.  Vinie KYM Maxcy, MD, [facility], FNLA, FACP  [place]  [facility]  Medical Director of the Advanced Lipid Disorders &  Cardiovascular Risk Reduction Clinic Diplomate of the American Board of Clinical Lipidology Attending Cardiologist  Direct Dial: [phone]  Fax: [phone]  Website:  [email or web address] Vinie BROCKS Ailah Barna 08/12/2024, 8:18 AM "

## 2024-08-12 NOTE — Patient Instructions (Signed)
 Medication Instructions:  No Changes  Lab Work: FASTING NMR Lipoprofile due in about three months (around 11/10/2024); okay to drink water and/or black coffee only, no cream no sugar; no appointment needed; go to any [facility].  Follow-Up: At [facility], you and your health needs are our priority.  As part of our continuing mission to provide you with exceptional heart care, our providers are all part of one team.  This team includes your primary Cardiologist (physician) and Advanced Practice Providers or APPs (Physician Assistants and Nurse Practitioners) who all work together to provide you with the care you need, when you need it.  Your next appointment:   1 year(s)  Provider:   Either Dr. Mona or one of the APPs  One of our Advanced Practice Providers (APPs): Morse Clause, PA-C  Lamarr Satterfield, NP Miriam Shams, NP  Olivia Pavy, PA-C Josefa Beauvais, NP  Leontine Salen, PA-C Orren Fabry, PA-C  [place], PA-C Ernest Dick, NP  Damien Braver, NP Jon Hails, PA-C  Waddell Donath, PA-C    Dayna Dunn, PA-C  Scott Weaver, PA-C Lum Louis, NP Katlyn West, NP Callie Goodrich, PA-C  Xika Zhao, NP Sheng Haley, PA-C    Kathleen Johnson, PA-C

## 2024-08-16 ENCOUNTER — Other Ambulatory Visit: Payer: Self-pay

## 2024-08-16 ENCOUNTER — Other Ambulatory Visit: Payer: Self-pay | Admitting: Allergy and Immunology

## 2024-08-16 ENCOUNTER — Encounter: Payer: Self-pay | Admitting: Allergy and Immunology

## 2024-08-16 ENCOUNTER — Ambulatory Visit: Admitting: Allergy and Immunology

## 2024-08-16 ENCOUNTER — Other Ambulatory Visit (HOSPITAL_BASED_OUTPATIENT_CLINIC_OR_DEPARTMENT_OTHER): Payer: Self-pay

## 2024-08-16 VITALS — BP 128/64 | HR 74 | Temp 97.9°F | Resp 18

## 2024-08-16 DIAGNOSIS — H1010 Acute atopic conjunctivitis, unspecified eye: Secondary | ICD-10-CM | POA: Diagnosis not present

## 2024-08-16 DIAGNOSIS — J3089 Other allergic rhinitis: Secondary | ICD-10-CM

## 2024-08-16 DIAGNOSIS — J4520 Mild intermittent asthma, uncomplicated: Secondary | ICD-10-CM | POA: Diagnosis not present

## 2024-08-16 DIAGNOSIS — J301 Allergic rhinitis due to pollen: Secondary | ICD-10-CM

## 2024-08-16 DIAGNOSIS — T7800XA Anaphylactic reaction due to unspecified food, initial encounter: Secondary | ICD-10-CM

## 2024-08-16 DIAGNOSIS — T7800XD Anaphylactic reaction due to unspecified food, subsequent encounter: Secondary | ICD-10-CM

## 2024-08-16 MED ORDER — AIRSUPRA 90-80 MCG/ACT IN AERO
2.0000 | INHALATION_SPRAY | RESPIRATORY_TRACT | 2 refills | Status: AC | PRN
  Filled 2024-08-16: qty 10.7, 17d supply, fill #0

## 2024-08-16 MED ORDER — FLUTICASONE PROPIONATE 50 MCG/ACT NA SUSP
1.0000 | Freq: Every day | NASAL | 2 refills | Status: AC | PRN
  Filled 2024-08-16: qty 16, 60d supply, fill #0

## 2024-08-16 MED ORDER — RYALTRIS 665-25 MCG/ACT NA SUSP
NASAL | 1 refills | Status: DC
  Filled 2024-08-16: qty 29, 30d supply, fill #0

## 2024-08-16 MED ORDER — OLOPATADINE HCL 0.2 % OP SOLN
OPHTHALMIC | 1 refills | Status: AC
  Filled 2024-08-16: qty 2.5, 25d supply, fill #0

## 2024-08-16 NOTE — Patient Instructions (Addendum)
" °  1. Allergen avoidance measures - dust mite, cat, dog, pollens, mold, seafood  2. Minimize exposure to scents / fumes  3. Minimize caffeine consumption to decrease headache  4. Discuss with [facility] about a headache preventative agent  5. Treat and prevent allergic reactions involving airway and eyes:   A. Sports glasses and mask during extensive exposure  B. Ryaltris  - 2 sprays each nostril 1-2 times per day (SP)  6. If needed:  A. Cetirizine  10 mg - 1 tablet 1-2 times per day    B. Pataday  - 1 drop each eye 1 time per day    C. Airsupra  - 2 inhalations every 4-6 hours    D. NEFFY , benadryl, MD/ER evaluation for allergic reaction  7. Consider starting Omalizumab / Xolair for food allergy  8. Return to clinic in 6 months or earlier if problem  9. Influenza = Tamiflu. Covid = Paxlovid "

## 2024-08-16 NOTE — Progress Notes (Unsigned)
 "  [place] [facility] [place] - [place] - [place] - [place]   Follow-up Note  Referring Provider: Redmon, Noelle, [place] Primary Provider: Redmon, Noelle, PA Date of Office Visit: 08/16/2024  Subjective:   Aimee Hall (DOB: Apr 19, 1971) is a 54 y.o. female who [facility] in re-evaluation of the following:  HPI: Jazilyn returns to this clinic in evaluation of asthma, allergic rhinoconjunctivitis, food allergy against fish and shellfish, migraine headache.  I last saw her in this clinic 10 February 2024.  She appears to have done pretty well since I last seen her in this clinic.  She has not required a systemic steroid or antibiotic for any type of airway issue and her use of a rescue medicine for asthma is minimal.  She has not really been doing too bad regarding both her nose or her eyes.  However, if she is exposed to specific scents or fumes or allergens she does develop problems.  For example, at work if she is exposed to vanilla send or if she is exposed to cooked seafood she will develop hives and eye swelling and feeling as though her chest is locking up.  Her neurologist has left the community and she does not have anyone actually caring for her migraine headaches.  She minimizes her caffeine is much as possible and overall her headaches are doing okay at this point of the year but they history of flaring during spring and summer and fall.  Allergies as of 08/16/2024       Reactions   Hydromorphone  Anaphylaxis   Iodine Anaphylaxis   Shellfish Allergy Anaphylaxis   Other Reaction(s): Unknown   Atorvastatin Other (See Comments)   Muscle cramps   Atorvastatin Calcium    Other Reaction(s): muscle spasms   Coconut Fragrance    Cucumber Fragrance    Gardenia Fragrance    Honey Almond Fragrance    Iodinated Contrast Media Hives   Sertraline    Other Reaction(s): bad dreams        Medication List    acetaminophen -codeine  300-15 MG  tablet Commonly known as: TYLENOL  #2 Take 1 tablet by mouth  once a day for HA that does not respond to initial treatment As needed   Airsupra  90-80 MCG/ACT Aero Generic drug: Albuterol -Budesonide  Inhale 2 Inhalations into the lungs every 4 (four) hours as needed.   albuterol  108 (90 Base) MCG/ACT inhaler Commonly known as: VENTOLIN  HFA Inhale 2 puffs into the lungs every 4 (four) hours as needed for wheezing.   albuterol  (2.5 MG/3ML) 0.083% nebulizer solution Commonly known as: PROVENTIL  Take 3 mLs (2.5 mg total) by nebulization every 4 (four) hours as needed for wheezing or shortness of breath.   cetirizine  10 MG tablet Commonly known as: ZYRTEC  Take 1 tablet (10 mg total) by mouth daily as needed for allergies or rhinitis.   clindamycin  1 % gel Commonly known as: CLINDAGEL  Apply 1 Application topically 2 (two) times daily.   clindamycin  1 % lotion Commonly known as: CLEOCIN  T Apply 1 Application topically daily.   EPINEPHrine  0.3 mg/0.3 mL Soaj injection Commonly known as: EpiPen  2-Pak Inject 0.3 mg into the skin as needed.   fluticasone  50 MCG/ACT nasal spray Commonly known as: FLONASE  Place 1 spray into both nostrils daily as needed.   FreeStyle Libre 2 Sensor Misc Apply sensor every 14 days as directed.   losartan  25 MG tablet Commonly known as: COZAAR  Take 1 tablet (25 mg total) by mouth daily.  Mounjaro  10 MG/0.5ML Pen Generic drug: tirzepatide  Inject 10 mg into the skin once a week.   Olopatadine  HCl 0.2 % Soln Instill 1 drop  into each eye once daily as needed.   ondansetron  4 MG disintegrating tablet Commonly known as: ZOFRAN -ODT Place 1 tablet (4 mg total) on the tongue and allow to dissolve every 8 (eight) hours as needed.   Repatha  SureClick 140 MG/ML Soaj Generic drug: Evolocumab  Inject 140 mg into the skin every 14 (fourteen) days.   Ryaltris  665-25 MCG/ACT Susp Generic drug: Olopatadine -Mometasone  2 sprays each nostril 1-2 times per day    traZODone  50 MG tablet Commonly known as: DESYREL  Take 1-2 tablets (50-100 mg total) by mouth 1 hour before bedtime daily as needed for sleep.   Ubrelvy  100 MG Tabs Generic drug: Ubrogepant  Take 1 tablet (100 mg total) by mouth at onset of headache.   Vitamin D  (Ergocalciferol ) 1.25 MG (50000 UNIT) Caps capsule Commonly known as: DRISDOL  Take 1 capsule (50,000 Units total) by mouth once a week.        Past Medical History:  Diagnosis Date   Asthma    Dehydration    Diabetes mellitus without complication (HCC)    Eczema    GERD (gastroesophageal reflux disease)    Syncopal episodes    Urticaria     Past Surgical History:  Procedure Laterality Date   NO PAST SURGERIES      Review of systems negative except as noted in HPI / PMHx or noted below:  Review of Systems  Constitutional: Negative.   HENT: Negative.    Eyes: Negative.   Respiratory: Negative.    Cardiovascular: Negative.   Gastrointestinal: Negative.   Genitourinary: Negative.   Musculoskeletal: Negative.   Skin: Negative.   Neurological: Negative.   Endo/Heme/Allergies: Negative.   Psychiatric/Behavioral: Negative.       Objective:   Vitals:   08/16/24 1047  BP: 128/64  Pulse: 74  Resp: 18  Temp: 97.9 F (36.6 C)  SpO2: 98%          Physical Exam Constitutional:      Appearance: She is not diaphoretic.  HENT:     Head: Normocephalic.     Right Ear: Tympanic membrane, ear canal and external ear normal.     Left Ear: Tympanic membrane, ear canal and external ear normal.     Nose: Nose normal. No mucosal edema or rhinorrhea.     Mouth/Throat:     Pharynx: Uvula midline. No oropharyngeal exudate.  Eyes:     Conjunctiva/sclera: Conjunctivae normal.  Neck:     Thyroid : No thyromegaly.     Trachea: Trachea normal. No tracheal tenderness or tracheal deviation.  Cardiovascular:     Rate and Rhythm: Normal rate and regular rhythm.     Heart sounds: Normal heart sounds, S1 normal  and S2 normal. No murmur heard. Pulmonary:     Effort: No respiratory distress.     Breath sounds: Normal breath sounds. No stridor. No wheezing or rales.  Lymphadenopathy:     Head:     Right side of head: No tonsillar adenopathy.     Left side of head: No tonsillar adenopathy.     Cervical: No cervical adenopathy.  Skin:    Findings: No erythema or rash.     Nails: There is no clubbing.  Neurological:     Mental Status: She is alert.     Diagnostics:    Spirometry was performed and demonstrated an FEV1 of 2.48  at 86 % of predicted.  Results of blood tests obtained 12 January 2024 identifies IgE 1738 KU/L, IgE antibodies directed against dust mite, cat, dog, tree, grass, weed, mold, crab, shellfish, lobster, clam, oyster, scallop, tuna, Trout, halibut, [place], tryptase 4.3 UG/L  Assessment and Plan:   1. Asthma, mild intermittent, well-controlled   2. Perennial allergic rhinitis   3. Seasonal allergic rhinitis due to pollen   4. Seasonal allergic conjunctivitis   5. Allergy with anaphylaxis due to food    1. Allergen avoidance measures - dust mite, cat, dog, pollens, mold, seafood  2. Minimize exposure to scents / fumes  3. Minimize caffeine consumption to decrease headache  4. Discuss with [facility] about a headache preventative agent  5. Treat and prevent allergic reactions involving airway and eyes:   A. Sports glasses and mask during extensive exposure  B. Ryaltris  - 2 sprays each nostril 1-2 times per day (SP)  6. If needed:  A. Cetirizine  10 mg - 1 tablet 1-2 times per day    B. Pataday  - 1 drop each eye 1 time per day    C. Airsupra  - 2 inhalations every 4-6 hours    D. NEFFY , benadryl, MD/ER evaluation for allergic reaction  7. Consider starting Omalizumab / Xolair for food allergy  8. Return to clinic in 6 months or earlier if problem  9. Influenza = Tamiflu. Covid = Paxlovid  Ardyce would benefit from using omalizumab to prevent her from developing significant  food driven allergic reactions.  She is having a difficult time avoiding seafood exposure while at work and this would definitely raise her threshold for exposure.  And this would also result in a decrease in her overall atopic sensitivity against all the things that she is allergic to.  She will enter into the spring with the plan noted above and if this is inefficient and preventing her from developing significant problems with her eyes or her nose or her chest then we will consider further evaluation and treatment.  If she does well I will see her back in this clinic in 6 months or earlier if there is a problem.  Camellia Denis, MD Allergy / Immunology [facility] "

## 2024-08-17 ENCOUNTER — Ambulatory Visit (HOSPITAL_BASED_OUTPATIENT_CLINIC_OR_DEPARTMENT_OTHER): Admitting: Physical Therapy

## 2024-08-17 ENCOUNTER — Other Ambulatory Visit: Payer: Self-pay | Admitting: *Deleted

## 2024-08-17 ENCOUNTER — Encounter: Payer: Self-pay | Admitting: Allergy and Immunology

## 2024-08-17 DIAGNOSIS — T466X5A Adverse effect of antihyperlipidemic and antiarteriosclerotic drugs, initial encounter: Secondary | ICD-10-CM

## 2024-08-17 DIAGNOSIS — E7849 Other hyperlipidemia: Secondary | ICD-10-CM

## 2024-08-17 MED ORDER — RYALTRIS 665-25 MCG/ACT NA SUSP: NASAL | 1 refills | Status: AC

## 2024-08-22 ENCOUNTER — Other Ambulatory Visit: Payer: Self-pay

## 2024-08-31 ENCOUNTER — Other Ambulatory Visit (HOSPITAL_BASED_OUTPATIENT_CLINIC_OR_DEPARTMENT_OTHER): Payer: Self-pay

## 2024-09-02 ENCOUNTER — Other Ambulatory Visit (HOSPITAL_BASED_OUTPATIENT_CLINIC_OR_DEPARTMENT_OTHER): Payer: Self-pay

## 2024-09-05 ENCOUNTER — Other Ambulatory Visit (HOSPITAL_BASED_OUTPATIENT_CLINIC_OR_DEPARTMENT_OTHER): Payer: Self-pay

## 2024-09-06 ENCOUNTER — Other Ambulatory Visit: Payer: Self-pay

## 2024-09-07 ENCOUNTER — Other Ambulatory Visit (HOSPITAL_BASED_OUTPATIENT_CLINIC_OR_DEPARTMENT_OTHER): Payer: Self-pay

## 2024-09-07 ENCOUNTER — Other Ambulatory Visit: Payer: Self-pay

## 2024-09-07 MED ORDER — VITAMIN D (ERGOCALCIFEROL) 1.25 MG (50000 UNIT) PO CAPS: 50000.0000 [IU] | ORAL_CAPSULE | ORAL | 0 refills | Status: AC

## 2024-09-07 MED ORDER — MOUNJARO 10 MG/0.5ML ~~LOC~~ SOAJ
10.0000 mg | SUBCUTANEOUS | 1 refills | Status: AC
  Filled 2024-09-07: qty 6, 84d supply, fill #0

## 2025-02-14 ENCOUNTER — Ambulatory Visit: Admitting: Allergy and Immunology

## 2025-08-01 ENCOUNTER — Ambulatory Visit: Admitting: Family Medicine
